# Patient Record
Sex: Female | Born: 1935 | Race: White | Hispanic: No | Smoking: Never smoker
Health system: Southern US, Community
[De-identification: ages and names within clinical notes are randomized; demographics above are authoritative.]

## PROBLEM LIST (undated history)

## (undated) DIAGNOSIS — J45909 Unspecified asthma, uncomplicated: Secondary | ICD-10-CM

## (undated) DIAGNOSIS — A048 Other specified bacterial intestinal infections: Secondary | ICD-10-CM

## (undated) DIAGNOSIS — K5790 Diverticulosis of intestine, part unspecified, without perforation or abscess without bleeding: Secondary | ICD-10-CM

## (undated) DIAGNOSIS — Z7902 Long term (current) use of antithrombotics/antiplatelets: Secondary | ICD-10-CM

## (undated) DIAGNOSIS — R7302 Impaired glucose tolerance (oral): Secondary | ICD-10-CM

## (undated) DIAGNOSIS — I4891 Unspecified atrial fibrillation: Secondary | ICD-10-CM

## (undated) DIAGNOSIS — K219 Gastro-esophageal reflux disease without esophagitis: Secondary | ICD-10-CM

## (undated) DIAGNOSIS — C50919 Malignant neoplasm of unspecified site of unspecified female breast: Secondary | ICD-10-CM

## (undated) DIAGNOSIS — H3530 Unspecified macular degeneration: Secondary | ICD-10-CM

## (undated) DIAGNOSIS — I509 Heart failure, unspecified: Secondary | ICD-10-CM

## (undated) DIAGNOSIS — Z515 Encounter for palliative care: Secondary | ICD-10-CM

## (undated) DIAGNOSIS — K5792 Diverticulitis of intestine, part unspecified, without perforation or abscess without bleeding: Secondary | ICD-10-CM

## (undated) DIAGNOSIS — Z8781 Personal history of (healed) traumatic fracture: Secondary | ICD-10-CM

## (undated) DIAGNOSIS — F32A Depression, unspecified: Secondary | ICD-10-CM

## (undated) DIAGNOSIS — I10 Essential (primary) hypertension: Secondary | ICD-10-CM

## (undated) DIAGNOSIS — R609 Edema, unspecified: Secondary | ICD-10-CM

## (undated) DIAGNOSIS — J309 Allergic rhinitis, unspecified: Secondary | ICD-10-CM

## (undated) DIAGNOSIS — Z7901 Long term (current) use of anticoagulants: Secondary | ICD-10-CM

## (undated) DIAGNOSIS — R202 Paresthesia of skin: Secondary | ICD-10-CM

## (undated) DIAGNOSIS — M810 Age-related osteoporosis without current pathological fracture: Secondary | ICD-10-CM

## (undated) DIAGNOSIS — I2510 Atherosclerotic heart disease of native coronary artery without angina pectoris: Secondary | ICD-10-CM

## (undated) DIAGNOSIS — H269 Unspecified cataract: Secondary | ICD-10-CM

## (undated) DIAGNOSIS — E559 Vitamin D deficiency, unspecified: Secondary | ICD-10-CM

## (undated) DIAGNOSIS — M4722 Other spondylosis with radiculopathy, cervical region: Secondary | ICD-10-CM

## (undated) DIAGNOSIS — M5416 Radiculopathy, lumbar region: Secondary | ICD-10-CM

## (undated) DIAGNOSIS — E7800 Pure hypercholesterolemia, unspecified: Secondary | ICD-10-CM

## (undated) DIAGNOSIS — F419 Anxiety disorder, unspecified: Secondary | ICD-10-CM

## (undated) DIAGNOSIS — I219 Acute myocardial infarction, unspecified: Secondary | ICD-10-CM

## (undated) DIAGNOSIS — R600 Localized edema: Secondary | ICD-10-CM

## (undated) DIAGNOSIS — N184 Chronic kidney disease, stage 4 (severe): Secondary | ICD-10-CM

## (undated) DIAGNOSIS — R0609 Other forms of dyspnea: Secondary | ICD-10-CM

## (undated) DIAGNOSIS — Z79891 Long term (current) use of opiate analgesic: Secondary | ICD-10-CM

## (undated) DIAGNOSIS — M545 Low back pain, unspecified: Secondary | ICD-10-CM

## (undated) DIAGNOSIS — M1990 Unspecified osteoarthritis, unspecified site: Secondary | ICD-10-CM

## (undated) DIAGNOSIS — I739 Peripheral vascular disease, unspecified: Secondary | ICD-10-CM

## (undated) DIAGNOSIS — D649 Anemia, unspecified: Secondary | ICD-10-CM

## (undated) DIAGNOSIS — E785 Hyperlipidemia, unspecified: Secondary | ICD-10-CM

## (undated) DIAGNOSIS — I447 Left bundle-branch block, unspecified: Secondary | ICD-10-CM

## (undated) DIAGNOSIS — N83209 Unspecified ovarian cyst, unspecified side: Secondary | ICD-10-CM

## (undated) DIAGNOSIS — G5600 Carpal tunnel syndrome, unspecified upper limb: Secondary | ICD-10-CM

## (undated) HISTORY — DX: Gastro-esophageal reflux disease without esophagitis: K21.9

## (undated) HISTORY — DX: Carpal tunnel syndrome, unspecified upper limb: G56.00

## (undated) HISTORY — DX: Acute myocardial infarction, unspecified: I21.9

## (undated) HISTORY — DX: Peripheral vascular disease, unspecified: I73.9

## (undated) HISTORY — DX: Diverticulosis of intestine, part unspecified, without perforation or abscess without bleeding: K57.90

## (undated) HISTORY — DX: Unspecified osteoarthritis, unspecified site: M19.90

## (undated) HISTORY — DX: Unspecified atrial fibrillation: I48.91

## (undated) HISTORY — DX: Other specified bacterial intestinal infections: A04.8

## (undated) HISTORY — DX: Age-related osteoporosis without current pathological fracture: M81.0

## (undated) HISTORY — DX: Allergic rhinitis, unspecified: J30.9

## (undated) HISTORY — DX: Heart failure, unspecified: I50.9

## (undated) HISTORY — DX: Anxiety disorder, unspecified: F41.9

## (undated) HISTORY — PX: COLONOSCOPY: SHX174

## (undated) HISTORY — DX: Chronic kidney disease, stage 4 (severe): N18.4

## (undated) HISTORY — DX: Low back pain, unspecified: M54.50

## (undated) HISTORY — DX: Unspecified ovarian cyst, unspecified side: N83.209

## (undated) HISTORY — DX: Unspecified asthma, uncomplicated: J45.909

## (undated) HISTORY — DX: Atherosclerotic heart disease of native coronary artery without angina pectoris: I25.10

## (undated) HISTORY — DX: Unspecified macular degeneration: H35.30

## (undated) HISTORY — DX: Impaired glucose tolerance (oral): R73.02

## (undated) HISTORY — DX: Depression, unspecified: F32.A

## (undated) HISTORY — DX: Malignant neoplasm of unspecified site of unspecified female breast: C50.919

## (undated) HISTORY — DX: Unspecified cataract: H26.9

## (undated) HISTORY — DX: Pure hypercholesterolemia, unspecified: E78.00

## (undated) HISTORY — DX: Vitamin D deficiency, unspecified: E55.9

## (undated) HISTORY — DX: Diverticulitis of intestine, part unspecified, without perforation or abscess without bleeding: K57.92

## (undated) HISTORY — DX: Personal history of (healed) traumatic fracture: Z87.81

---

## 1970-09-17 HISTORY — PX: ABDOMINAL HYSTERECTOMY: SHX81

## 1988-09-17 DIAGNOSIS — Z8781 Personal history of (healed) traumatic fracture: Secondary | ICD-10-CM

## 1988-09-17 HISTORY — DX: Personal history of (healed) traumatic fracture: Z87.81

## 1993-09-17 DIAGNOSIS — C50911 Malignant neoplasm of unspecified site of right female breast: Secondary | ICD-10-CM

## 1993-09-17 HISTORY — DX: Malignant neoplasm of unspecified site of right female breast: C50.911

## 1997-09-17 HISTORY — PX: MASTECTOMY: SHX3

## 2002-09-17 HISTORY — PX: SIGMOIDOSCOPY: SUR1295

## 2004-06-17 ENCOUNTER — Ambulatory Visit: Payer: Self-pay | Admitting: Oncology

## 2004-07-27 ENCOUNTER — Ambulatory Visit: Payer: Self-pay | Admitting: Oncology

## 2004-08-17 ENCOUNTER — Ambulatory Visit: Payer: Self-pay | Admitting: Oncology

## 2004-09-17 HISTORY — PX: UPPER GASTROINTESTINAL ENDOSCOPY: SHX188

## 2004-12-03 ENCOUNTER — Emergency Department: Payer: Self-pay | Admitting: Emergency Medicine

## 2004-12-04 ENCOUNTER — Ambulatory Visit: Payer: Self-pay | Admitting: Oncology

## 2004-12-16 ENCOUNTER — Ambulatory Visit: Payer: Self-pay | Admitting: Oncology

## 2004-12-18 ENCOUNTER — Ambulatory Visit: Payer: Self-pay

## 2005-01-31 ENCOUNTER — Ambulatory Visit: Payer: Self-pay | Admitting: Oncology

## 2005-06-04 ENCOUNTER — Ambulatory Visit: Payer: Self-pay | Admitting: Oncology

## 2005-06-17 ENCOUNTER — Ambulatory Visit: Payer: Self-pay | Admitting: Oncology

## 2005-06-17 DIAGNOSIS — A048 Other specified bacterial intestinal infections: Secondary | ICD-10-CM

## 2005-06-17 HISTORY — DX: Other specified bacterial intestinal infections: A04.8

## 2005-06-29 ENCOUNTER — Ambulatory Visit: Payer: Self-pay | Admitting: Gastroenterology

## 2005-07-05 ENCOUNTER — Ambulatory Visit: Payer: Self-pay | Admitting: Gastroenterology

## 2005-07-18 ENCOUNTER — Ambulatory Visit: Payer: Self-pay | Admitting: Oncology

## 2005-09-17 HISTORY — PX: CATARACT EXTRACTION: SUR2

## 2005-10-19 ENCOUNTER — Ambulatory Visit: Payer: Self-pay | Admitting: Oncology

## 2005-11-15 ENCOUNTER — Ambulatory Visit: Payer: Self-pay | Admitting: Oncology

## 2005-12-16 DIAGNOSIS — Z9842 Cataract extraction status, left eye: Secondary | ICD-10-CM

## 2005-12-16 HISTORY — DX: Cataract extraction status, right eye: Z98.42

## 2005-12-19 ENCOUNTER — Ambulatory Visit: Payer: Self-pay | Admitting: Ophthalmology

## 2006-02-04 ENCOUNTER — Ambulatory Visit: Payer: Self-pay | Admitting: Oncology

## 2006-02-28 IMAGING — NM NUCLEAR MEDICINE GASTRIC EMPTYING STUDY
1 series · 6 of 6 positions shown · non-contrast
Comparison: none

REASON FOR EXAM: LLQ and RLQ abdominal pain, nausea
COMMENTS:

[Series 0: gastric empty · 1.7mm · 1.65mm/px · 6 of 20 frames shown]
[frame 2/20]
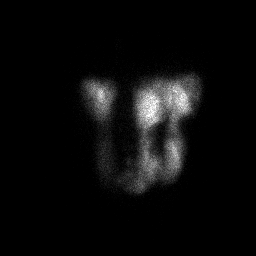
[frame 5/20]
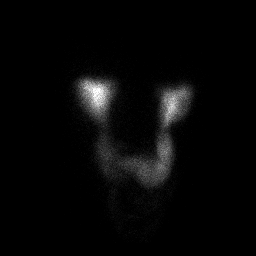
[frame 9/20]
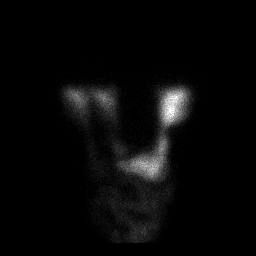
[frame 12/20]
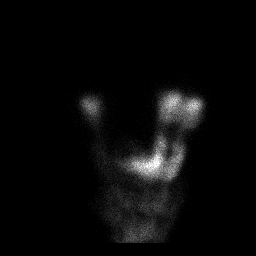
[frame 15/20]
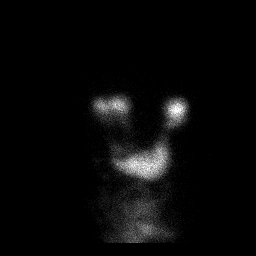
[frame 19/20]
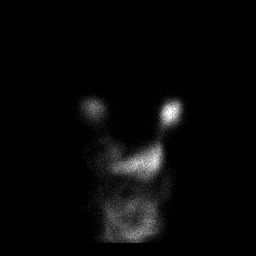

[6 of 6 positions shown; findings below may reference images not displayed]

PROCEDURE:     NM  - NM GASTRIC EMPTYING STUDY  - June 29, 2005  [DATE]

RESULT:     Following oral administration of 2.1 mCi of Technetium 99m
Sulfur Colloid administered in an egg sandwich with 8 ounces of water, there
is observed 53.7% clearance of the administered tracer at 95 minutes. This
value is in the normal range.
IMPRESSION: Normal study.

## 2006-05-07 ENCOUNTER — Ambulatory Visit: Payer: Self-pay | Admitting: Ophthalmology

## 2006-10-18 ENCOUNTER — Ambulatory Visit: Payer: Self-pay | Admitting: Internal Medicine

## 2006-11-16 ENCOUNTER — Ambulatory Visit: Payer: Self-pay | Admitting: Internal Medicine

## 2006-12-04 ENCOUNTER — Ambulatory Visit: Payer: Self-pay | Admitting: Specialist

## 2006-12-25 ENCOUNTER — Ambulatory Visit: Payer: Self-pay | Admitting: Pain Medicine

## 2007-01-09 ENCOUNTER — Ambulatory Visit: Payer: Self-pay | Admitting: Pain Medicine

## 2007-02-04 ENCOUNTER — Ambulatory Visit: Payer: Self-pay | Admitting: Physician Assistant

## 2007-02-14 ENCOUNTER — Ambulatory Visit: Payer: Self-pay | Admitting: Internal Medicine

## 2007-06-23 ENCOUNTER — Inpatient Hospital Stay: Payer: Self-pay | Admitting: Internal Medicine

## 2007-10-19 ENCOUNTER — Ambulatory Visit: Payer: Self-pay | Admitting: Internal Medicine

## 2007-11-16 ENCOUNTER — Ambulatory Visit: Payer: Self-pay | Admitting: Internal Medicine

## 2008-02-23 ENCOUNTER — Ambulatory Visit: Payer: Self-pay | Admitting: Internal Medicine

## 2008-05-18 ENCOUNTER — Ambulatory Visit: Payer: Self-pay | Admitting: Internal Medicine

## 2008-07-06 ENCOUNTER — Ambulatory Visit: Payer: Self-pay | Admitting: Family Medicine

## 2008-07-13 ENCOUNTER — Ambulatory Visit: Payer: Self-pay | Admitting: Internal Medicine

## 2008-10-18 ENCOUNTER — Ambulatory Visit: Payer: Self-pay | Admitting: Internal Medicine

## 2008-11-11 ENCOUNTER — Ambulatory Visit: Payer: Self-pay | Admitting: Internal Medicine

## 2008-11-15 ENCOUNTER — Ambulatory Visit: Payer: Self-pay | Admitting: Internal Medicine

## 2009-02-24 ENCOUNTER — Ambulatory Visit: Payer: Self-pay | Admitting: Oncology

## 2009-08-18 ENCOUNTER — Observation Stay: Payer: Self-pay | Admitting: Internal Medicine

## 2009-09-14 ENCOUNTER — Ambulatory Visit: Payer: Self-pay | Admitting: Internal Medicine

## 2009-09-14 ENCOUNTER — Emergency Department: Payer: Self-pay | Admitting: Unknown Physician Specialty

## 2009-09-21 ENCOUNTER — Ambulatory Visit: Payer: Self-pay

## 2009-10-18 ENCOUNTER — Ambulatory Visit: Payer: Self-pay | Admitting: Internal Medicine

## 2009-10-19 ENCOUNTER — Ambulatory Visit: Payer: Self-pay | Admitting: Pain Medicine

## 2009-11-01 ENCOUNTER — Ambulatory Visit: Payer: Self-pay | Admitting: Pain Medicine

## 2009-11-10 ENCOUNTER — Ambulatory Visit: Payer: Self-pay | Admitting: Internal Medicine

## 2009-11-15 ENCOUNTER — Ambulatory Visit: Payer: Self-pay | Admitting: Internal Medicine

## 2009-11-28 ENCOUNTER — Ambulatory Visit: Payer: Self-pay | Admitting: Pain Medicine

## 2009-12-13 ENCOUNTER — Ambulatory Visit: Payer: Self-pay | Admitting: Pain Medicine

## 2009-12-29 ENCOUNTER — Ambulatory Visit: Payer: Self-pay | Admitting: Pain Medicine

## 2010-03-24 ENCOUNTER — Ambulatory Visit: Payer: Self-pay | Admitting: Family Medicine

## 2010-05-16 IMAGING — US US EXTREM LOW VENOUS*L*
1 series · 17 of 24 positions shown · non-contrast
Comparison: none

REASON FOR EXAM: left groin pain
COMMENTS:

[Series 1: us extrem low venous*left* · 17 of 28 slices shown]
[im 1/28]
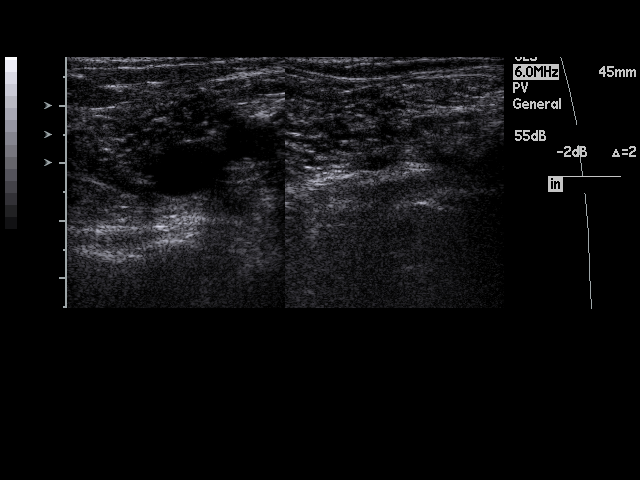
[im 3/28]
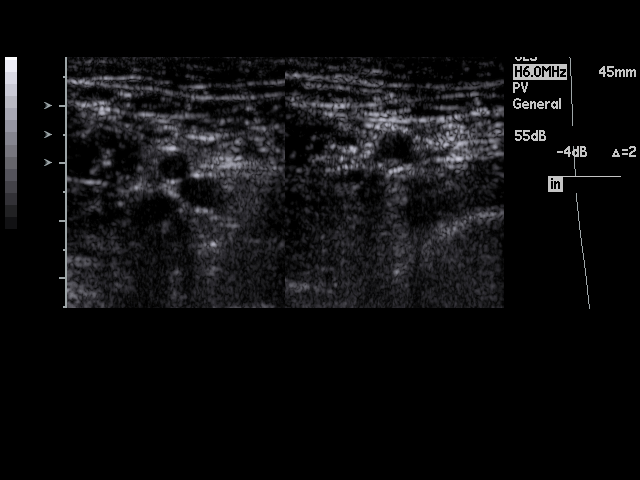
[im 4/28]
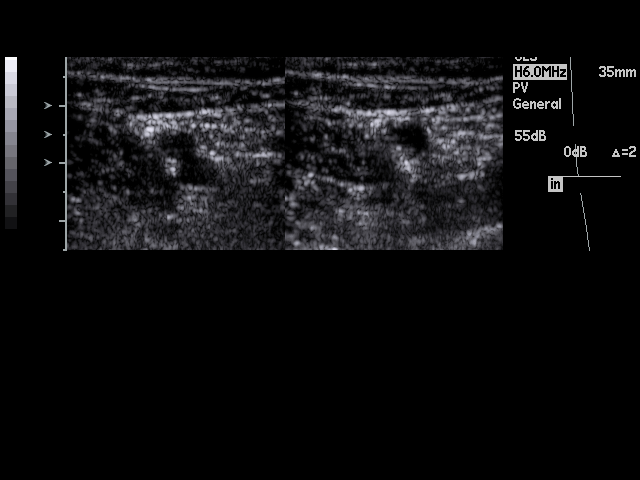
[im 5/28]
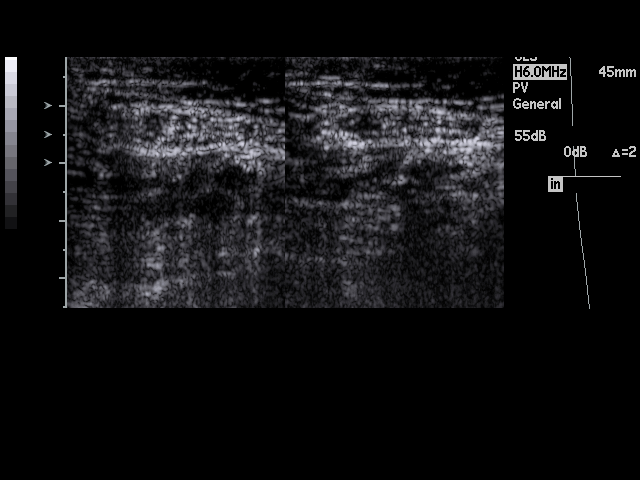
[im 8/28]
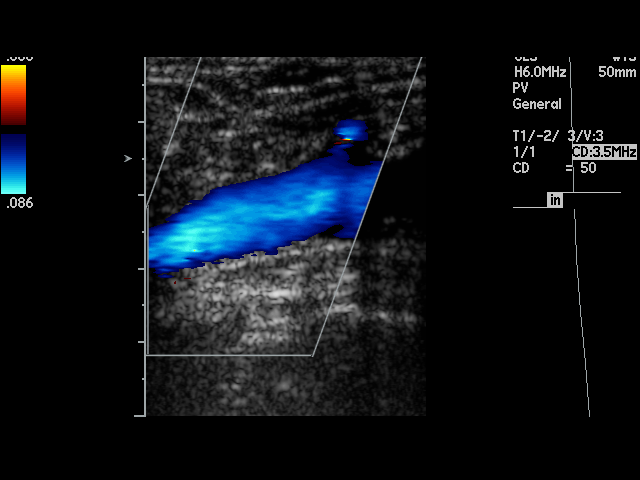
[im 9/28]
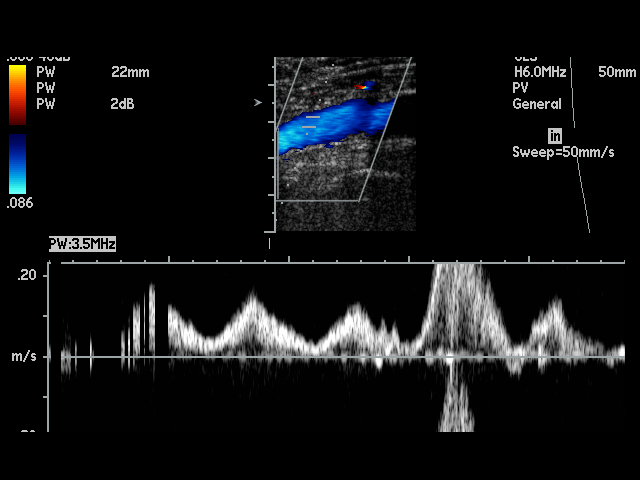
[im 11/28]
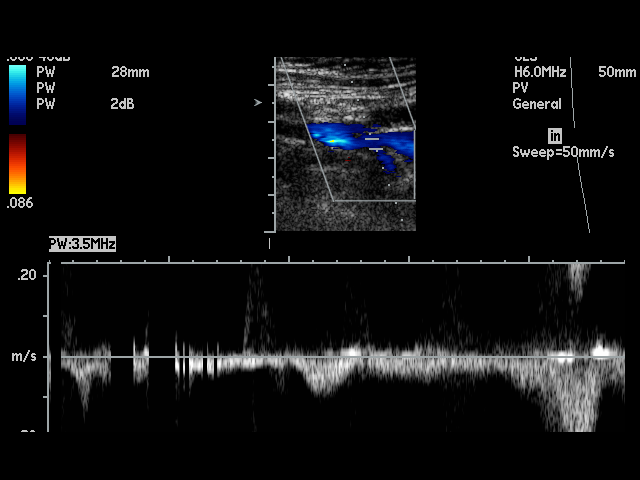
[im 12/28]
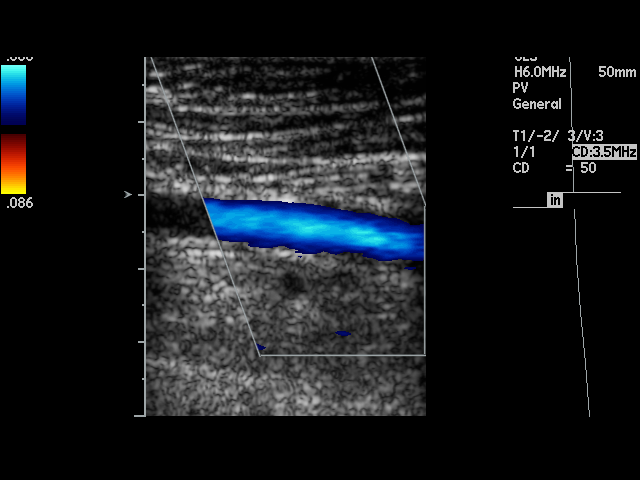
[im 15/28]
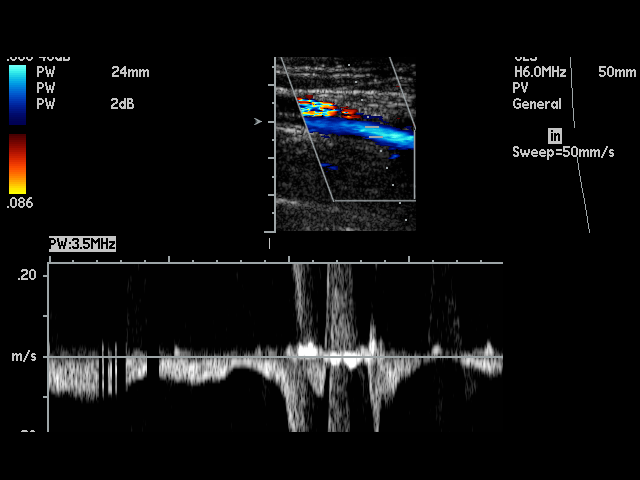
[im 16/28]
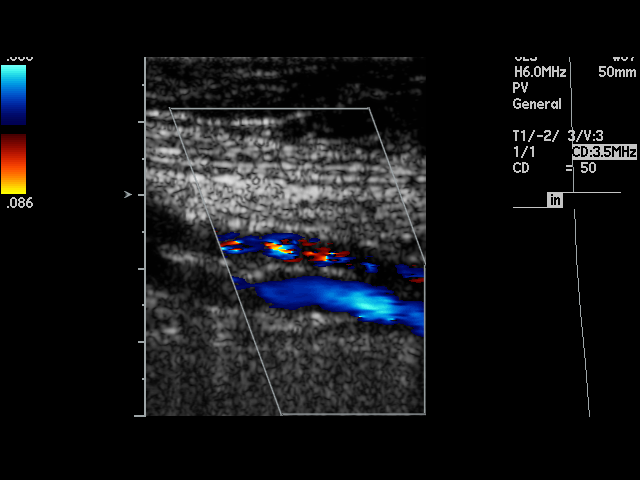
[im 17/28]
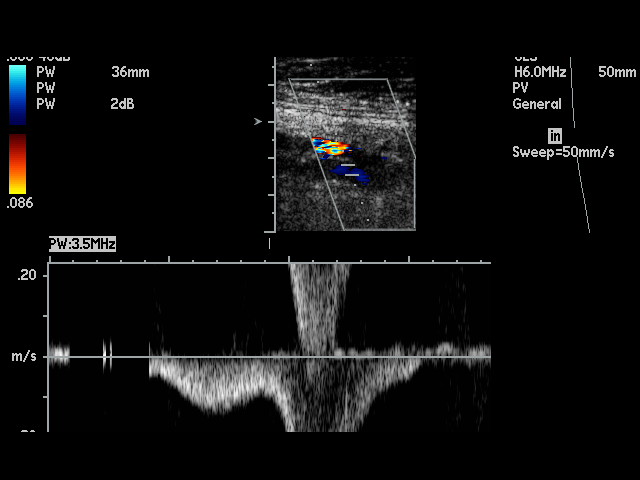
[im 19/28]
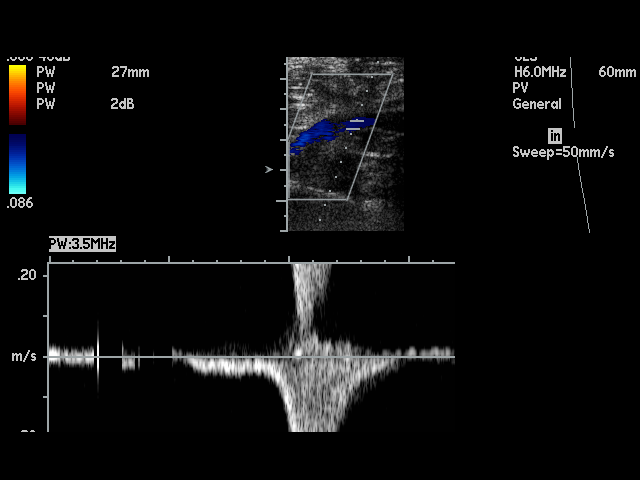
[im 20/28]
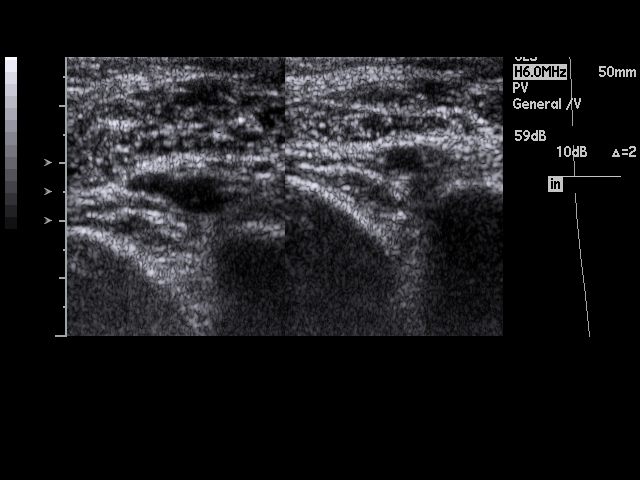
[im 23/28]
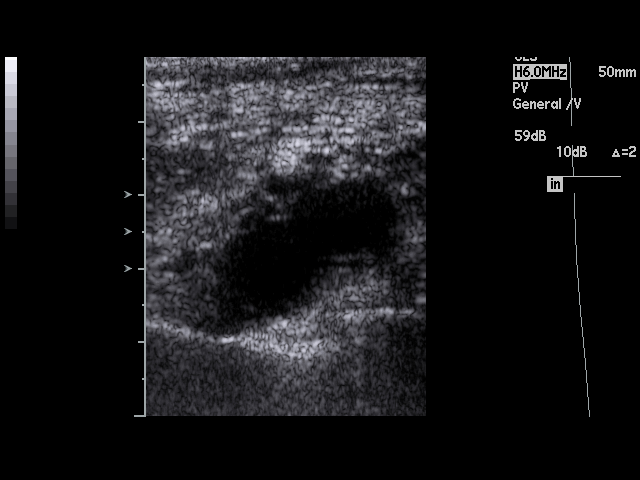
[im 24/28]
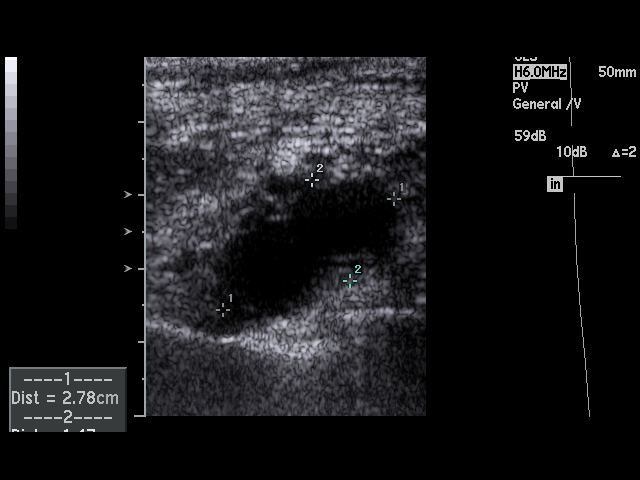
[im 25/28]
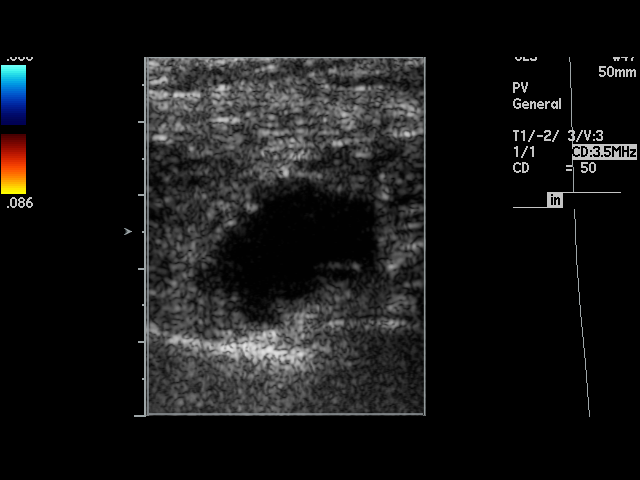
[im 28/28]
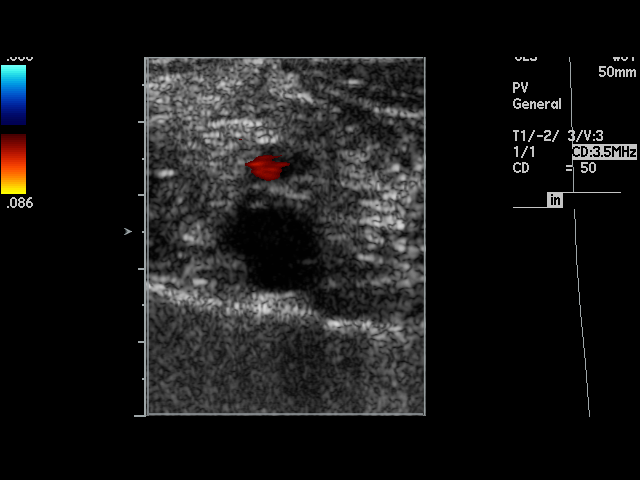

[17 of 24 positions shown; findings below may reference images not displayed]

PROCEDURE:     US  - US DOPPLER LOW EXTR LEFT  - September 14, 2009  [DATE]

RESULT:     The left femoral and popliteal veins are normally compressible.
The waveform patterns are normal and the color flow images are normal. The
response to the Valsalva maneuver is normal. There is a popliteal cyst
measuring 2.8 cm in greatest dimension.
IMPRESSION: There is no evidence of deep venous thrombosis in the left
lower extremity. There is a Baker's cyst present.

## 2010-10-19 ENCOUNTER — Ambulatory Visit: Payer: Self-pay | Admitting: Family Medicine

## 2010-11-24 ENCOUNTER — Ambulatory Visit: Payer: Self-pay | Admitting: Internal Medicine

## 2010-11-24 ENCOUNTER — Emergency Department: Payer: Self-pay | Admitting: Emergency Medicine

## 2011-04-17 ENCOUNTER — Ambulatory Visit: Payer: Self-pay | Admitting: Family Medicine

## 2012-05-16 ENCOUNTER — Ambulatory Visit: Payer: Self-pay | Admitting: Family Medicine

## 2012-07-11 ENCOUNTER — Ambulatory Visit: Payer: Self-pay

## 2013-06-10 ENCOUNTER — Ambulatory Visit: Payer: Self-pay | Admitting: Family Medicine

## 2013-11-12 ENCOUNTER — Ambulatory Visit: Payer: Self-pay | Admitting: Emergency Medicine

## 2014-01-20 ENCOUNTER — Ambulatory Visit: Payer: Self-pay | Admitting: Gastroenterology

## 2015-05-09 ENCOUNTER — Other Ambulatory Visit: Payer: Self-pay | Admitting: Family Medicine

## 2015-05-09 DIAGNOSIS — M79604 Pain in right leg: Secondary | ICD-10-CM

## 2015-05-09 DIAGNOSIS — M545 Low back pain, unspecified: Secondary | ICD-10-CM

## 2015-05-09 DIAGNOSIS — R29898 Other symptoms and signs involving the musculoskeletal system: Secondary | ICD-10-CM

## 2015-05-09 DIAGNOSIS — R202 Paresthesia of skin: Secondary | ICD-10-CM

## 2015-05-12 ENCOUNTER — Ambulatory Visit
Admission: RE | Admit: 2015-05-12 | Discharge: 2015-05-12 | Disposition: A | Discharge status: Home / Self Care | Payer: Medicare Other | Source: Ambulatory Visit | Attending: Family Medicine | Admitting: Family Medicine

## 2015-05-12 DIAGNOSIS — M47896 Other spondylosis, lumbar region: Secondary | ICD-10-CM | POA: Diagnosis not present

## 2015-05-12 DIAGNOSIS — R202 Paresthesia of skin: Secondary | ICD-10-CM | POA: Diagnosis present

## 2015-05-12 DIAGNOSIS — M2410 Other articular cartilage disorders, unspecified site: Secondary | ICD-10-CM | POA: Insufficient documentation

## 2015-05-12 DIAGNOSIS — M4854XD Collapsed vertebra, not elsewhere classified, thoracic region, subsequent encounter for fracture with routine healing: Secondary | ICD-10-CM | POA: Insufficient documentation

## 2015-05-12 DIAGNOSIS — M79604 Pain in right leg: Secondary | ICD-10-CM

## 2015-05-12 DIAGNOSIS — M5124 Other intervertebral disc displacement, thoracic region: Secondary | ICD-10-CM | POA: Insufficient documentation

## 2015-05-12 DIAGNOSIS — R29898 Other symptoms and signs involving the musculoskeletal system: Secondary | ICD-10-CM

## 2015-05-12 DIAGNOSIS — M545 Low back pain: Secondary | ICD-10-CM | POA: Diagnosis present

## 2015-05-12 DIAGNOSIS — X58XXXD Exposure to other specified factors, subsequent encounter: Secondary | ICD-10-CM | POA: Diagnosis not present

## 2015-09-23 DIAGNOSIS — E7800 Pure hypercholesterolemia, unspecified: Secondary | ICD-10-CM | POA: Diagnosis not present

## 2015-09-23 DIAGNOSIS — K210 Gastro-esophageal reflux disease with esophagitis: Secondary | ICD-10-CM | POA: Diagnosis not present

## 2015-09-23 DIAGNOSIS — F419 Anxiety disorder, unspecified: Secondary | ICD-10-CM | POA: Diagnosis not present

## 2015-10-06 DIAGNOSIS — R0982 Postnasal drip: Secondary | ICD-10-CM | POA: Diagnosis not present

## 2015-11-25 DIAGNOSIS — H353212 Exudative age-related macular degeneration, right eye, with inactive choroidal neovascularization: Secondary | ICD-10-CM | POA: Diagnosis not present

## 2015-11-25 DIAGNOSIS — H353122 Nonexudative age-related macular degeneration, left eye, intermediate dry stage: Secondary | ICD-10-CM | POA: Diagnosis not present

## 2015-12-01 DIAGNOSIS — M4806 Spinal stenosis, lumbar region: Secondary | ICD-10-CM | POA: Diagnosis not present

## 2015-12-01 DIAGNOSIS — M5136 Other intervertebral disc degeneration, lumbar region: Secondary | ICD-10-CM | POA: Diagnosis not present

## 2015-12-01 DIAGNOSIS — M5416 Radiculopathy, lumbar region: Secondary | ICD-10-CM | POA: Diagnosis not present

## 2016-01-05 DIAGNOSIS — M5136 Other intervertebral disc degeneration, lumbar region: Secondary | ICD-10-CM | POA: Diagnosis not present

## 2016-01-05 DIAGNOSIS — M4806 Spinal stenosis, lumbar region: Secondary | ICD-10-CM | POA: Diagnosis not present

## 2016-01-05 DIAGNOSIS — M5416 Radiculopathy, lumbar region: Secondary | ICD-10-CM | POA: Diagnosis not present

## 2016-01-26 DIAGNOSIS — M47817 Spondylosis without myelopathy or radiculopathy, lumbosacral region: Secondary | ICD-10-CM | POA: Diagnosis not present

## 2016-02-08 DIAGNOSIS — R52 Pain, unspecified: Secondary | ICD-10-CM | POA: Diagnosis not present

## 2016-02-08 DIAGNOSIS — M47817 Spondylosis without myelopathy or radiculopathy, lumbosacral region: Secondary | ICD-10-CM | POA: Diagnosis not present

## 2016-02-24 DIAGNOSIS — H353212 Exudative age-related macular degeneration, right eye, with inactive choroidal neovascularization: Secondary | ICD-10-CM | POA: Diagnosis not present

## 2016-03-12 DIAGNOSIS — M47817 Spondylosis without myelopathy or radiculopathy, lumbosacral region: Secondary | ICD-10-CM | POA: Diagnosis not present

## 2016-03-28 DIAGNOSIS — M47817 Spondylosis without myelopathy or radiculopathy, lumbosacral region: Secondary | ICD-10-CM | POA: Diagnosis not present

## 2016-04-10 DIAGNOSIS — K210 Gastro-esophageal reflux disease with esophagitis: Secondary | ICD-10-CM | POA: Diagnosis not present

## 2016-04-10 DIAGNOSIS — R600 Localized edema: Secondary | ICD-10-CM | POA: Diagnosis not present

## 2016-04-10 DIAGNOSIS — F419 Anxiety disorder, unspecified: Secondary | ICD-10-CM | POA: Diagnosis not present

## 2016-06-29 DIAGNOSIS — H353212 Exudative age-related macular degeneration, right eye, with inactive choroidal neovascularization: Secondary | ICD-10-CM | POA: Diagnosis not present

## 2016-10-12 DIAGNOSIS — K210 Gastro-esophageal reflux disease with esophagitis: Secondary | ICD-10-CM | POA: Diagnosis not present

## 2016-10-12 DIAGNOSIS — E7800 Pure hypercholesterolemia, unspecified: Secondary | ICD-10-CM | POA: Diagnosis not present

## 2016-10-12 DIAGNOSIS — L989 Disorder of the skin and subcutaneous tissue, unspecified: Secondary | ICD-10-CM | POA: Diagnosis not present

## 2016-10-12 DIAGNOSIS — F419 Anxiety disorder, unspecified: Secondary | ICD-10-CM | POA: Diagnosis not present

## 2016-10-26 DIAGNOSIS — H353211 Exudative age-related macular degeneration, right eye, with active choroidal neovascularization: Secondary | ICD-10-CM | POA: Diagnosis not present

## 2016-11-14 DIAGNOSIS — L821 Other seborrheic keratosis: Secondary | ICD-10-CM | POA: Diagnosis not present

## 2016-11-14 DIAGNOSIS — C44622 Squamous cell carcinoma of skin of right upper limb, including shoulder: Secondary | ICD-10-CM | POA: Diagnosis not present

## 2016-11-14 DIAGNOSIS — D485 Neoplasm of uncertain behavior of skin: Secondary | ICD-10-CM | POA: Diagnosis not present

## 2016-11-14 DIAGNOSIS — L570 Actinic keratosis: Secondary | ICD-10-CM | POA: Diagnosis not present

## 2016-12-25 DIAGNOSIS — L570 Actinic keratosis: Secondary | ICD-10-CM | POA: Diagnosis not present

## 2016-12-25 DIAGNOSIS — C44622 Squamous cell carcinoma of skin of right upper limb, including shoulder: Secondary | ICD-10-CM | POA: Diagnosis not present

## 2017-02-28 DIAGNOSIS — H353212 Exudative age-related macular degeneration, right eye, with inactive choroidal neovascularization: Secondary | ICD-10-CM | POA: Diagnosis not present

## 2017-02-28 DIAGNOSIS — M3501 Sicca syndrome with keratoconjunctivitis: Secondary | ICD-10-CM | POA: Diagnosis not present

## 2017-03-29 DIAGNOSIS — H353211 Exudative age-related macular degeneration, right eye, with active choroidal neovascularization: Secondary | ICD-10-CM | POA: Diagnosis not present

## 2017-04-23 DIAGNOSIS — M47816 Spondylosis without myelopathy or radiculopathy, lumbar region: Secondary | ICD-10-CM | POA: Diagnosis not present

## 2017-04-23 DIAGNOSIS — M5136 Other intervertebral disc degeneration, lumbar region: Secondary | ICD-10-CM | POA: Diagnosis not present

## 2017-04-25 DIAGNOSIS — K210 Gastro-esophageal reflux disease with esophagitis: Secondary | ICD-10-CM | POA: Diagnosis not present

## 2017-04-25 DIAGNOSIS — E7800 Pure hypercholesterolemia, unspecified: Secondary | ICD-10-CM | POA: Diagnosis not present

## 2017-04-25 DIAGNOSIS — R200 Anesthesia of skin: Secondary | ICD-10-CM | POA: Diagnosis not present

## 2017-04-25 DIAGNOSIS — R5383 Other fatigue: Secondary | ICD-10-CM | POA: Diagnosis not present

## 2017-04-25 DIAGNOSIS — Z79899 Other long term (current) drug therapy: Secondary | ICD-10-CM | POA: Diagnosis not present

## 2017-04-25 DIAGNOSIS — F5105 Insomnia due to other mental disorder: Secondary | ICD-10-CM | POA: Diagnosis not present

## 2017-04-25 DIAGNOSIS — F419 Anxiety disorder, unspecified: Secondary | ICD-10-CM | POA: Diagnosis not present

## 2017-04-25 DIAGNOSIS — Z833 Family history of diabetes mellitus: Secondary | ICD-10-CM | POA: Diagnosis not present

## 2017-04-25 DIAGNOSIS — N289 Disorder of kidney and ureter, unspecified: Secondary | ICD-10-CM | POA: Diagnosis not present

## 2017-05-06 DIAGNOSIS — M47816 Spondylosis without myelopathy or radiculopathy, lumbar region: Secondary | ICD-10-CM | POA: Diagnosis not present

## 2017-05-06 DIAGNOSIS — M47817 Spondylosis without myelopathy or radiculopathy, lumbosacral region: Secondary | ICD-10-CM | POA: Diagnosis not present

## 2017-05-30 DIAGNOSIS — N289 Disorder of kidney and ureter, unspecified: Secondary | ICD-10-CM | POA: Diagnosis not present

## 2017-07-18 DIAGNOSIS — M47816 Spondylosis without myelopathy or radiculopathy, lumbar region: Secondary | ICD-10-CM | POA: Diagnosis not present

## 2017-07-18 DIAGNOSIS — M533 Sacrococcygeal disorders, not elsewhere classified: Secondary | ICD-10-CM | POA: Diagnosis not present

## 2017-08-12 DIAGNOSIS — M533 Sacrococcygeal disorders, not elsewhere classified: Secondary | ICD-10-CM | POA: Diagnosis not present

## 2017-08-12 DIAGNOSIS — M47898 Other spondylosis, sacral and sacrococcygeal region: Secondary | ICD-10-CM | POA: Diagnosis not present

## 2017-10-01 DIAGNOSIS — H353212 Exudative age-related macular degeneration, right eye, with inactive choroidal neovascularization: Secondary | ICD-10-CM | POA: Diagnosis not present

## 2017-10-01 DIAGNOSIS — H43813 Vitreous degeneration, bilateral: Secondary | ICD-10-CM | POA: Diagnosis not present

## 2017-10-24 DIAGNOSIS — M5134 Other intervertebral disc degeneration, thoracic region: Secondary | ICD-10-CM | POA: Diagnosis not present

## 2017-10-24 DIAGNOSIS — M549 Dorsalgia, unspecified: Secondary | ICD-10-CM | POA: Diagnosis not present

## 2017-10-24 DIAGNOSIS — Z853 Personal history of malignant neoplasm of breast: Secondary | ICD-10-CM | POA: Diagnosis not present

## 2017-10-24 DIAGNOSIS — Z9011 Acquired absence of right breast and nipple: Secondary | ICD-10-CM | POA: Diagnosis not present

## 2017-10-24 DIAGNOSIS — M4807 Spinal stenosis, lumbosacral region: Secondary | ICD-10-CM | POA: Diagnosis not present

## 2017-10-24 DIAGNOSIS — G8929 Other chronic pain: Secondary | ICD-10-CM | POA: Diagnosis not present

## 2017-10-24 DIAGNOSIS — M546 Pain in thoracic spine: Secondary | ICD-10-CM | POA: Diagnosis not present

## 2017-10-24 DIAGNOSIS — Z79899 Other long term (current) drug therapy: Secondary | ICD-10-CM | POA: Diagnosis not present

## 2017-10-24 DIAGNOSIS — M5136 Other intervertebral disc degeneration, lumbar region: Secondary | ICD-10-CM | POA: Diagnosis not present

## 2017-10-24 DIAGNOSIS — M5135 Other intervertebral disc degeneration, thoracolumbar region: Secondary | ICD-10-CM | POA: Diagnosis not present

## 2017-10-24 DIAGNOSIS — Z7982 Long term (current) use of aspirin: Secondary | ICD-10-CM | POA: Diagnosis not present

## 2017-10-29 DIAGNOSIS — Z0000 Encounter for general adult medical examination without abnormal findings: Secondary | ICD-10-CM | POA: Diagnosis not present

## 2017-10-29 DIAGNOSIS — R030 Elevated blood-pressure reading, without diagnosis of hypertension: Secondary | ICD-10-CM | POA: Diagnosis not present

## 2017-10-29 DIAGNOSIS — R7302 Impaired glucose tolerance (oral): Secondary | ICD-10-CM | POA: Diagnosis not present

## 2017-10-29 DIAGNOSIS — K210 Gastro-esophageal reflux disease with esophagitis: Secondary | ICD-10-CM | POA: Diagnosis not present

## 2017-10-29 DIAGNOSIS — F419 Anxiety disorder, unspecified: Secondary | ICD-10-CM | POA: Diagnosis not present

## 2017-10-29 DIAGNOSIS — Z7722 Contact with and (suspected) exposure to environmental tobacco smoke (acute) (chronic): Secondary | ICD-10-CM | POA: Diagnosis not present

## 2017-10-29 DIAGNOSIS — Z79899 Other long term (current) drug therapy: Secondary | ICD-10-CM | POA: Diagnosis not present

## 2017-10-29 DIAGNOSIS — Z23 Encounter for immunization: Secondary | ICD-10-CM | POA: Diagnosis not present

## 2017-10-29 DIAGNOSIS — E7800 Pure hypercholesterolemia, unspecified: Secondary | ICD-10-CM | POA: Diagnosis not present

## 2017-11-11 DIAGNOSIS — M47814 Spondylosis without myelopathy or radiculopathy, thoracic region: Secondary | ICD-10-CM | POA: Diagnosis not present

## 2019-08-10 HISTORY — PX: CARPAL TUNNEL RELEASE: SHX101

## 2019-11-16 DIAGNOSIS — R200 Anesthesia of skin: Secondary | ICD-10-CM | POA: Diagnosis not present

## 2019-11-16 DIAGNOSIS — G5601 Carpal tunnel syndrome, right upper limb: Secondary | ICD-10-CM | POA: Diagnosis not present

## 2019-11-16 DIAGNOSIS — M79641 Pain in right hand: Secondary | ICD-10-CM | POA: Diagnosis not present

## 2019-11-16 DIAGNOSIS — R202 Paresthesia of skin: Secondary | ICD-10-CM | POA: Diagnosis not present

## 2019-12-29 DIAGNOSIS — Z79899 Other long term (current) drug therapy: Secondary | ICD-10-CM | POA: Diagnosis not present

## 2019-12-29 DIAGNOSIS — M47817 Spondylosis without myelopathy or radiculopathy, lumbosacral region: Secondary | ICD-10-CM | POA: Diagnosis not present

## 2019-12-29 DIAGNOSIS — R600 Localized edema: Secondary | ICD-10-CM | POA: Diagnosis not present

## 2019-12-29 DIAGNOSIS — I499 Cardiac arrhythmia, unspecified: Secondary | ICD-10-CM | POA: Diagnosis not present

## 2020-01-12 DIAGNOSIS — M542 Cervicalgia: Secondary | ICD-10-CM | POA: Diagnosis not present

## 2020-01-12 DIAGNOSIS — M47817 Spondylosis without myelopathy or radiculopathy, lumbosacral region: Secondary | ICD-10-CM | POA: Diagnosis not present

## 2020-01-12 DIAGNOSIS — R600 Localized edema: Secondary | ICD-10-CM | POA: Diagnosis not present

## 2020-01-12 DIAGNOSIS — M791 Myalgia, unspecified site: Secondary | ICD-10-CM | POA: Diagnosis not present

## 2020-01-18 DIAGNOSIS — H353212 Exudative age-related macular degeneration, right eye, with inactive choroidal neovascularization: Secondary | ICD-10-CM | POA: Diagnosis not present

## 2020-02-04 DIAGNOSIS — L239 Allergic contact dermatitis, unspecified cause: Secondary | ICD-10-CM | POA: Diagnosis not present

## 2020-02-04 DIAGNOSIS — G5691 Unspecified mononeuropathy of right upper limb: Secondary | ICD-10-CM | POA: Diagnosis not present

## 2020-02-04 DIAGNOSIS — R600 Localized edema: Secondary | ICD-10-CM | POA: Diagnosis not present

## 2020-03-08 DIAGNOSIS — L600 Ingrowing nail: Secondary | ICD-10-CM | POA: Diagnosis not present

## 2020-03-08 DIAGNOSIS — M79674 Pain in right toe(s): Secondary | ICD-10-CM | POA: Diagnosis not present

## 2020-03-08 DIAGNOSIS — L03031 Cellulitis of right toe: Secondary | ICD-10-CM | POA: Diagnosis not present

## 2020-04-05 DIAGNOSIS — L600 Ingrowing nail: Secondary | ICD-10-CM | POA: Diagnosis not present

## 2020-04-05 DIAGNOSIS — M79674 Pain in right toe(s): Secondary | ICD-10-CM | POA: Diagnosis not present

## 2020-04-05 DIAGNOSIS — I739 Peripheral vascular disease, unspecified: Secondary | ICD-10-CM | POA: Diagnosis not present

## 2020-04-13 DIAGNOSIS — R600 Localized edema: Secondary | ICD-10-CM | POA: Diagnosis not present

## 2020-04-13 DIAGNOSIS — M47816 Spondylosis without myelopathy or radiculopathy, lumbar region: Secondary | ICD-10-CM | POA: Diagnosis not present

## 2020-04-13 DIAGNOSIS — Z1329 Encounter for screening for other suspected endocrine disorder: Secondary | ICD-10-CM | POA: Diagnosis not present

## 2020-04-13 DIAGNOSIS — R030 Elevated blood-pressure reading, without diagnosis of hypertension: Secondary | ICD-10-CM | POA: Diagnosis not present

## 2020-04-13 DIAGNOSIS — M4722 Other spondylosis with radiculopathy, cervical region: Secondary | ICD-10-CM | POA: Diagnosis not present

## 2020-04-13 DIAGNOSIS — K2100 Gastro-esophageal reflux disease with esophagitis, without bleeding: Secondary | ICD-10-CM | POA: Diagnosis not present

## 2020-04-13 DIAGNOSIS — E7800 Pure hypercholesterolemia, unspecified: Secondary | ICD-10-CM | POA: Diagnosis not present

## 2020-04-13 DIAGNOSIS — R7302 Impaired glucose tolerance (oral): Secondary | ICD-10-CM | POA: Diagnosis not present

## 2020-05-03 DIAGNOSIS — L600 Ingrowing nail: Secondary | ICD-10-CM | POA: Diagnosis not present

## 2020-05-17 ENCOUNTER — Telehealth: Payer: Self-pay | Admitting: Family Medicine

## 2020-05-17 NOTE — Telephone Encounter (Signed)
°   Cheyenne [name] DOB: Aug 01, 1928 MRN: [id number]   RIDER WAIVER AND RELEASE OF LIABILITY  For purposes of improving physical access to our facilities, [facility] is pleased to partner with third parties to provide [place] [facility] patients or other authorized individuals the option of convenient, on-demand ground transportation services (the [employer]) through use of the technology service that enables users to request on-demand ground transportation from independent third-party providers.  By opting to use and accept these [employer], I, the undersigned, hereby agree on behalf of myself, and on behalf of any minor child using the [employer] for whom I am the parent or legal guardian, as follows:  1. Government social research officer provided to me are provided by independent third-party transportation providers who are not [employer] or employees and who are unaffiliated with [employer]. 2. [facility] is neither a transportation carrier nor a common or public carrier. 3. [facility] has no control over the quality or safety of the transportation that occurs as a result of the [employer]. 4. [facility] cannot guarantee that any third-party transportation provider will complete any arranged transportation service. 5. [facility] makes no representation, warranty, or guarantee regarding the reliability, timeliness, quality, safety, suitability, or availability of any of the Transport Services or that they will be error free. 6. I fully understand that traveling by vehicle involves risks and dangers of serious bodily injury, including permanent disability, paralysis, and death. I agree, on behalf of myself and on behalf of any minor child using the Transport Services for whom I am the parent or legal guardian, that the entire risk arising out of my use of the [employer] remains solely with me, to the maximum extent permitted under applicable law. 7. The [employer] are provided as is and as available. [facility] disclaims all representations and warranties, express, implied or statutory, not expressly set out in these terms, including the implied warranties of merchantability and fitness for a particular purpose. 8. I hereby waive and release [facility], its agents, employees, officers, directors, representatives, insurers, attorneys, assigns, successors, subsidiaries, and affiliates from any and all past, present, or future claims, demands, liabilities, actions, causes of action, or suits of any kind directly or indirectly arising from acceptance and use of the [employer]. 9. I further waive and release [facility] and its affiliates from all present and future liability and responsibility for any injury or death to persons or damages to property caused by or related to the use of the [employer]. 10. I have read this Waiver and Release of Liability, and I understand the terms used in it and their legal significance. This Waiver is freely and voluntarily given with the understanding that my right (as well as the right of any minor child for whom I am the parent or legal guardian using the [employer]) to legal recourse against [place] in connection with the [employer] is knowingly surrendered in return for use of these services.   I attest that I read the consent document to Cheyenne [name], gave Cheyenne [name] the opportunity to ask questions and answered the questions asked (if any). I affirm that Cheyenne [name] then provided consent for she's participation in this program.     Cheyenne [name]

## 2020-07-19 ENCOUNTER — Telehealth: Payer: Self-pay | Admitting: Family Medicine

## 2020-07-19 NOTE — Telephone Encounter (Signed)
°   DHIYA SMITS DOB: 04/22/1928 MRN: [id number]   RIDER WAIVER AND RELEASE OF LIABILITY  For purposes of improving physical access to our facilities, [facility] is pleased to partner with third parties to provide [facility] patients or other authorized individuals the option of convenient, on-demand ground transportation services (the [employer]) through use of the technology service that enables users to request on-demand ground transportation from independent third-party providers.  By opting to use and accept these [employer], I, the undersigned, hereby agree on behalf of myself, and on behalf of any minor child using the [employer] for whom I am the parent or legal guardian, as follows:  1. Government social research officer provided to me are provided by independent third-party transportation providers who are not [employer] or employees and who are unaffiliated with [employer]. 2. [facility] is neither a transportation carrier nor a common or public carrier. 3. [facility] has no control over the quality or safety of the transportation that occurs as a result of the [employer]. 4. [place] [facility] cannot guarantee that any third-party transportation provider will complete any arranged transportation service. 5. [facility] makes no representation, warranty, or guarantee regarding the reliability, timeliness, quality, safety, suitability, or availability of any of the Transport Services or that they will be error free. 6. I fully understand that traveling by vehicle involves risks and dangers of serious bodily injury, including permanent disability, paralysis, and death. I agree, on behalf of myself and on behalf of any minor child using the [employer] Services for whom I am the parent or legal guardian, that the entire risk arising out of my use of the [employer] remains solely with me, to the maximum extent permitted under applicable law. 7. The [employer] are provided as is and as available. [facility] disclaims all representations and warranties, express, implied or statutory, not expressly set out in these terms, including the implied warranties of merchantability and fitness for a particular purpose. 8. I hereby waive and release [facility], its agents, employees, officers, directors, representatives, insurers, attorneys, assigns, successors, subsidiaries, and affiliates from any and all past, present, or future claims, demands, liabilities, actions, causes of action, or suits of any kind directly or indirectly arising from acceptance and use of the [employer]. 9. I further waive and release [facility] and its affiliates from all present and future liability and responsibility for any injury or death to persons or damages to property caused by or related to the use of the [employer]. 10. I have read this Waiver and Release of Liability, and I understand the terms used in it and their legal significance. This Waiver is freely and voluntarily given with the understanding that my right (as well as the right of any minor child for whom I am the parent or legal guardian using the [employer]) to legal recourse against [place] in connection with the [employer] is knowingly surrendered in return for use of these services.   I attest that I read the consent document to [name], gave Ms. Hollenbeck the opportunity to ask questions and answered the questions asked (if any). I affirm that [name] then provided consent for she's participation in this program.     Drucie Ip

## 2020-07-21 DIAGNOSIS — H353212 Exudative age-related macular degeneration, right eye, with inactive choroidal neovascularization: Secondary | ICD-10-CM | POA: Diagnosis not present

## 2020-10-14 DIAGNOSIS — K2100 Gastro-esophageal reflux disease with esophagitis, without bleeding: Secondary | ICD-10-CM | POA: Diagnosis not present

## 2020-10-14 DIAGNOSIS — E559 Vitamin D deficiency, unspecified: Secondary | ICD-10-CM | POA: Diagnosis not present

## 2020-10-14 DIAGNOSIS — R030 Elevated blood-pressure reading, without diagnosis of hypertension: Secondary | ICD-10-CM | POA: Diagnosis not present

## 2020-10-14 DIAGNOSIS — R600 Localized edema: Secondary | ICD-10-CM | POA: Diagnosis not present

## 2020-10-14 DIAGNOSIS — Z0000 Encounter for general adult medical examination without abnormal findings: Secondary | ICD-10-CM | POA: Diagnosis not present

## 2020-10-14 DIAGNOSIS — R7302 Impaired glucose tolerance (oral): Secondary | ICD-10-CM | POA: Diagnosis not present

## 2020-10-14 DIAGNOSIS — R69 Illness, unspecified: Secondary | ICD-10-CM | POA: Diagnosis not present

## 2020-10-14 DIAGNOSIS — I739 Peripheral vascular disease, unspecified: Secondary | ICD-10-CM | POA: Diagnosis not present

## 2020-10-14 DIAGNOSIS — M47817 Spondylosis without myelopathy or radiculopathy, lumbosacral region: Secondary | ICD-10-CM | POA: Diagnosis not present

## 2020-10-14 DIAGNOSIS — E7800 Pure hypercholesterolemia, unspecified: Secondary | ICD-10-CM | POA: Diagnosis not present

## 2021-01-26 DIAGNOSIS — H353212 Exudative age-related macular degeneration, right eye, with inactive choroidal neovascularization: Secondary | ICD-10-CM | POA: Diagnosis not present

## 2021-04-17 DIAGNOSIS — Z131 Encounter for screening for diabetes mellitus: Secondary | ICD-10-CM | POA: Diagnosis not present

## 2021-04-17 DIAGNOSIS — F419 Anxiety disorder, unspecified: Secondary | ICD-10-CM | POA: Diagnosis not present

## 2021-04-17 DIAGNOSIS — R600 Localized edema: Secondary | ICD-10-CM | POA: Diagnosis not present

## 2021-04-17 DIAGNOSIS — R030 Elevated blood-pressure reading, without diagnosis of hypertension: Secondary | ICD-10-CM | POA: Diagnosis not present

## 2021-04-17 DIAGNOSIS — R262 Difficulty in walking, not elsewhere classified: Secondary | ICD-10-CM | POA: Diagnosis not present

## 2021-04-17 DIAGNOSIS — R5381 Other malaise: Secondary | ICD-10-CM | POA: Diagnosis not present

## 2021-04-17 DIAGNOSIS — Z1329 Encounter for screening for other suspected endocrine disorder: Secondary | ICD-10-CM | POA: Diagnosis not present

## 2021-04-17 DIAGNOSIS — F4321 Adjustment disorder with depressed mood: Secondary | ICD-10-CM | POA: Diagnosis not present

## 2021-04-17 DIAGNOSIS — E7800 Pure hypercholesterolemia, unspecified: Secondary | ICD-10-CM | POA: Diagnosis not present

## 2021-04-17 DIAGNOSIS — R7302 Impaired glucose tolerance (oral): Secondary | ICD-10-CM | POA: Diagnosis not present

## 2021-04-26 DIAGNOSIS — M545 Low back pain, unspecified: Secondary | ICD-10-CM | POA: Diagnosis not present

## 2021-04-26 DIAGNOSIS — G8929 Other chronic pain: Secondary | ICD-10-CM | POA: Diagnosis not present

## 2021-04-26 DIAGNOSIS — R5381 Other malaise: Secondary | ICD-10-CM | POA: Diagnosis not present

## 2021-04-26 DIAGNOSIS — R2689 Other abnormalities of gait and mobility: Secondary | ICD-10-CM | POA: Diagnosis not present

## 2021-04-26 DIAGNOSIS — M6281 Muscle weakness (generalized): Secondary | ICD-10-CM | POA: Diagnosis not present

## 2021-05-01 DIAGNOSIS — M6281 Muscle weakness (generalized): Secondary | ICD-10-CM | POA: Diagnosis not present

## 2021-05-01 DIAGNOSIS — R2689 Other abnormalities of gait and mobility: Secondary | ICD-10-CM | POA: Diagnosis not present

## 2021-05-01 DIAGNOSIS — G8929 Other chronic pain: Secondary | ICD-10-CM | POA: Diagnosis not present

## 2021-05-01 DIAGNOSIS — M545 Low back pain, unspecified: Secondary | ICD-10-CM | POA: Diagnosis not present

## 2021-05-01 DIAGNOSIS — R5381 Other malaise: Secondary | ICD-10-CM | POA: Diagnosis not present

## 2021-05-08 DIAGNOSIS — G8929 Other chronic pain: Secondary | ICD-10-CM | POA: Diagnosis not present

## 2021-05-08 DIAGNOSIS — M545 Low back pain, unspecified: Secondary | ICD-10-CM | POA: Diagnosis not present

## 2021-05-08 DIAGNOSIS — R5381 Other malaise: Secondary | ICD-10-CM | POA: Diagnosis not present

## 2021-05-08 DIAGNOSIS — R2689 Other abnormalities of gait and mobility: Secondary | ICD-10-CM | POA: Diagnosis not present

## 2021-05-08 DIAGNOSIS — M6281 Muscle weakness (generalized): Secondary | ICD-10-CM | POA: Diagnosis not present

## 2021-07-27 DIAGNOSIS — H353122 Nonexudative age-related macular degeneration, left eye, intermediate dry stage: Secondary | ICD-10-CM | POA: Diagnosis not present

## 2021-08-12 DIAGNOSIS — I10 Essential (primary) hypertension: Secondary | ICD-10-CM | POA: Diagnosis not present

## 2021-08-17 HISTORY — PX: CARDIAC SURGERY: SHX584

## 2021-08-19 DIAGNOSIS — M533 Sacrococcygeal disorders, not elsewhere classified: Secondary | ICD-10-CM | POA: Diagnosis not present

## 2021-08-22 DIAGNOSIS — R52 Pain, unspecified: Secondary | ICD-10-CM | POA: Diagnosis not present

## 2021-08-22 DIAGNOSIS — M545 Low back pain, unspecified: Secondary | ICD-10-CM | POA: Diagnosis not present

## 2021-08-22 DIAGNOSIS — M5416 Radiculopathy, lumbar region: Secondary | ICD-10-CM | POA: Diagnosis not present

## 2021-08-24 ENCOUNTER — Other Ambulatory Visit
Admission: RE | Admit: 2021-08-24 | Discharge: 2021-08-24 | Disposition: A | Discharge status: Home / Self Care | Payer: Medicare HMO | Source: Ambulatory Visit | Attending: Internal Medicine | Admitting: Internal Medicine

## 2021-08-24 DIAGNOSIS — E7800 Pure hypercholesterolemia, unspecified: Secondary | ICD-10-CM | POA: Diagnosis not present

## 2021-08-24 DIAGNOSIS — Z7722 Contact with and (suspected) exposure to environmental tobacco smoke (acute) (chronic): Secondary | ICD-10-CM | POA: Diagnosis not present

## 2021-08-24 DIAGNOSIS — R0989 Other specified symptoms and signs involving the circulatory and respiratory systems: Secondary | ICD-10-CM | POA: Diagnosis not present

## 2021-08-24 DIAGNOSIS — R0602 Shortness of breath: Secondary | ICD-10-CM | POA: Diagnosis not present

## 2021-08-24 DIAGNOSIS — Z79899 Other long term (current) drug therapy: Secondary | ICD-10-CM | POA: Insufficient documentation

## 2021-08-24 DIAGNOSIS — R600 Localized edema: Secondary | ICD-10-CM | POA: Insufficient documentation

## 2021-08-24 DIAGNOSIS — I2510 Atherosclerotic heart disease of native coronary artery without angina pectoris: Secondary | ICD-10-CM | POA: Insufficient documentation

## 2021-08-24 DIAGNOSIS — I739 Peripheral vascular disease, unspecified: Secondary | ICD-10-CM | POA: Diagnosis not present

## 2021-08-24 DIAGNOSIS — R262 Difficulty in walking, not elsewhere classified: Secondary | ICD-10-CM | POA: Diagnosis not present

## 2021-08-24 LAB — BRAIN NATRIURETIC PEPTIDE: B Natriuretic Peptide: 818.4 pg/mL — ABNORMAL HIGH (ref 0.0–100.0)

## 2021-08-30 DIAGNOSIS — M5416 Radiculopathy, lumbar region: Secondary | ICD-10-CM | POA: Diagnosis not present

## 2021-09-01 DIAGNOSIS — M5416 Radiculopathy, lumbar region: Secondary | ICD-10-CM | POA: Diagnosis not present

## 2021-09-13 DIAGNOSIS — M7989 Other specified soft tissue disorders: Secondary | ICD-10-CM | POA: Diagnosis not present

## 2021-09-13 DIAGNOSIS — K5792 Diverticulitis of intestine, part unspecified, without perforation or abscess without bleeding: Secondary | ICD-10-CM | POA: Diagnosis not present

## 2021-09-13 DIAGNOSIS — I5043 Acute on chronic combined systolic (congestive) and diastolic (congestive) heart failure: Secondary | ICD-10-CM | POA: Diagnosis not present

## 2021-09-13 DIAGNOSIS — Z9221 Personal history of antineoplastic chemotherapy: Secondary | ICD-10-CM | POA: Diagnosis not present

## 2021-09-13 DIAGNOSIS — I130 Hypertensive heart and chronic kidney disease with heart failure and stage 1 through stage 4 chronic kidney disease, or unspecified chronic kidney disease: Secondary | ICD-10-CM | POA: Diagnosis not present

## 2021-09-13 DIAGNOSIS — I2510 Atherosclerotic heart disease of native coronary artery without angina pectoris: Secondary | ICD-10-CM | POA: Diagnosis not present

## 2021-09-13 DIAGNOSIS — I214 Non-ST elevation (NSTEMI) myocardial infarction: Secondary | ICD-10-CM

## 2021-09-13 DIAGNOSIS — E785 Hyperlipidemia, unspecified: Secondary | ICD-10-CM | POA: Diagnosis not present

## 2021-09-13 DIAGNOSIS — Z66 Do not resuscitate: Secondary | ICD-10-CM | POA: Diagnosis not present

## 2021-09-13 DIAGNOSIS — N184 Chronic kidney disease, stage 4 (severe): Secondary | ICD-10-CM | POA: Diagnosis not present

## 2021-09-13 DIAGNOSIS — F32A Depression, unspecified: Secondary | ICD-10-CM | POA: Diagnosis not present

## 2021-09-13 DIAGNOSIS — R0789 Other chest pain: Secondary | ICD-10-CM | POA: Diagnosis not present

## 2021-09-13 DIAGNOSIS — J45909 Unspecified asthma, uncomplicated: Secondary | ICD-10-CM | POA: Diagnosis not present

## 2021-09-13 DIAGNOSIS — I25118 Atherosclerotic heart disease of native coronary artery with other forms of angina pectoris: Secondary | ICD-10-CM | POA: Diagnosis not present

## 2021-09-13 DIAGNOSIS — R0602 Shortness of breath: Secondary | ICD-10-CM | POA: Diagnosis not present

## 2021-09-13 DIAGNOSIS — M79605 Pain in left leg: Secondary | ICD-10-CM | POA: Diagnosis not present

## 2021-09-13 DIAGNOSIS — I2119 ST elevation (STEMI) myocardial infarction involving other coronary artery of inferior wall: Secondary | ICD-10-CM | POA: Diagnosis not present

## 2021-09-13 DIAGNOSIS — D631 Anemia in chronic kidney disease: Secondary | ICD-10-CM | POA: Diagnosis not present

## 2021-09-13 DIAGNOSIS — R0609 Other forms of dyspnea: Secondary | ICD-10-CM | POA: Diagnosis not present

## 2021-09-13 DIAGNOSIS — I5021 Acute systolic (congestive) heart failure: Secondary | ICD-10-CM | POA: Diagnosis not present

## 2021-09-13 DIAGNOSIS — E875 Hyperkalemia: Secondary | ICD-10-CM | POA: Diagnosis not present

## 2021-09-13 DIAGNOSIS — R600 Localized edema: Secondary | ICD-10-CM | POA: Diagnosis not present

## 2021-09-13 DIAGNOSIS — I5023 Acute on chronic systolic (congestive) heart failure: Secondary | ICD-10-CM | POA: Diagnosis not present

## 2021-09-13 HISTORY — DX: Non-ST elevation (NSTEMI) myocardial infarction: I21.4

## 2021-09-14 DIAGNOSIS — I5021 Acute systolic (congestive) heart failure: Secondary | ICD-10-CM | POA: Diagnosis not present

## 2021-09-14 DIAGNOSIS — R0609 Other forms of dyspnea: Secondary | ICD-10-CM | POA: Diagnosis not present

## 2021-09-14 DIAGNOSIS — I5020 Unspecified systolic (congestive) heart failure: Secondary | ICD-10-CM

## 2021-09-14 DIAGNOSIS — N184 Chronic kidney disease, stage 4 (severe): Secondary | ICD-10-CM | POA: Diagnosis not present

## 2021-09-14 DIAGNOSIS — M79605 Pain in left leg: Secondary | ICD-10-CM | POA: Diagnosis not present

## 2021-09-14 DIAGNOSIS — I2510 Atherosclerotic heart disease of native coronary artery without angina pectoris: Secondary | ICD-10-CM | POA: Diagnosis not present

## 2021-09-14 DIAGNOSIS — I2119 ST elevation (STEMI) myocardial infarction involving other coronary artery of inferior wall: Secondary | ICD-10-CM | POA: Diagnosis not present

## 2021-09-14 HISTORY — DX: Unspecified systolic (congestive) heart failure: I50.20

## 2021-09-15 DIAGNOSIS — R0609 Other forms of dyspnea: Secondary | ICD-10-CM | POA: Diagnosis not present

## 2021-09-15 DIAGNOSIS — I2119 ST elevation (STEMI) myocardial infarction involving other coronary artery of inferior wall: Secondary | ICD-10-CM | POA: Diagnosis not present

## 2021-09-15 DIAGNOSIS — I5021 Acute systolic (congestive) heart failure: Secondary | ICD-10-CM | POA: Diagnosis not present

## 2021-09-15 DIAGNOSIS — N184 Chronic kidney disease, stage 4 (severe): Secondary | ICD-10-CM | POA: Diagnosis not present

## 2021-09-15 DIAGNOSIS — R0602 Shortness of breath: Secondary | ICD-10-CM | POA: Diagnosis not present

## 2021-09-15 DIAGNOSIS — E785 Hyperlipidemia, unspecified: Secondary | ICD-10-CM | POA: Diagnosis not present

## 2021-09-15 DIAGNOSIS — I2510 Atherosclerotic heart disease of native coronary artery without angina pectoris: Secondary | ICD-10-CM | POA: Diagnosis not present

## 2021-09-15 DIAGNOSIS — M79605 Pain in left leg: Secondary | ICD-10-CM | POA: Diagnosis not present

## 2021-09-15 DIAGNOSIS — I5023 Acute on chronic systolic (congestive) heart failure: Secondary | ICD-10-CM | POA: Diagnosis not present

## 2021-09-15 HISTORY — PX: CORONARY ANGIOPLASTY WITH STENT PLACEMENT: SHX49

## 2021-09-16 DIAGNOSIS — I2119 ST elevation (STEMI) myocardial infarction involving other coronary artery of inferior wall: Secondary | ICD-10-CM | POA: Diagnosis not present

## 2021-09-16 DIAGNOSIS — I2510 Atherosclerotic heart disease of native coronary artery without angina pectoris: Secondary | ICD-10-CM | POA: Diagnosis not present

## 2021-09-16 DIAGNOSIS — M79605 Pain in left leg: Secondary | ICD-10-CM | POA: Diagnosis not present

## 2021-09-16 DIAGNOSIS — N184 Chronic kidney disease, stage 4 (severe): Secondary | ICD-10-CM | POA: Diagnosis not present

## 2021-09-16 DIAGNOSIS — I5021 Acute systolic (congestive) heart failure: Secondary | ICD-10-CM | POA: Diagnosis not present

## 2021-09-16 DIAGNOSIS — R0609 Other forms of dyspnea: Secondary | ICD-10-CM | POA: Diagnosis not present

## 2021-09-17 DIAGNOSIS — K5792 Diverticulitis of intestine, part unspecified, without perforation or abscess without bleeding: Secondary | ICD-10-CM | POA: Diagnosis not present

## 2021-09-17 DIAGNOSIS — I130 Hypertensive heart and chronic kidney disease with heart failure and stage 1 through stage 4 chronic kidney disease, or unspecified chronic kidney disease: Secondary | ICD-10-CM | POA: Diagnosis not present

## 2021-09-17 DIAGNOSIS — Z66 Do not resuscitate: Secondary | ICD-10-CM | POA: Diagnosis not present

## 2021-09-17 DIAGNOSIS — I2119 ST elevation (STEMI) myocardial infarction involving other coronary artery of inferior wall: Secondary | ICD-10-CM | POA: Diagnosis not present

## 2021-09-17 DIAGNOSIS — J45909 Unspecified asthma, uncomplicated: Secondary | ICD-10-CM | POA: Diagnosis not present

## 2021-09-17 DIAGNOSIS — I214 Non-ST elevation (NSTEMI) myocardial infarction: Secondary | ICD-10-CM | POA: Diagnosis not present

## 2021-09-17 DIAGNOSIS — R0602 Shortness of breath: Secondary | ICD-10-CM | POA: Diagnosis not present

## 2021-09-17 DIAGNOSIS — I5021 Acute systolic (congestive) heart failure: Secondary | ICD-10-CM | POA: Diagnosis not present

## 2021-09-17 DIAGNOSIS — N184 Chronic kidney disease, stage 4 (severe): Secondary | ICD-10-CM | POA: Diagnosis not present

## 2021-09-17 DIAGNOSIS — M7989 Other specified soft tissue disorders: Secondary | ICD-10-CM | POA: Diagnosis not present

## 2021-09-17 DIAGNOSIS — I5043 Acute on chronic combined systolic (congestive) and diastolic (congestive) heart failure: Secondary | ICD-10-CM | POA: Diagnosis not present

## 2021-09-17 DIAGNOSIS — F32A Depression, unspecified: Secondary | ICD-10-CM | POA: Diagnosis not present

## 2021-09-17 DIAGNOSIS — D631 Anemia in chronic kidney disease: Secondary | ICD-10-CM | POA: Diagnosis not present

## 2021-09-17 DIAGNOSIS — E785 Hyperlipidemia, unspecified: Secondary | ICD-10-CM | POA: Diagnosis not present

## 2021-09-17 DIAGNOSIS — R0609 Other forms of dyspnea: Secondary | ICD-10-CM | POA: Diagnosis not present

## 2021-09-17 DIAGNOSIS — M79605 Pain in left leg: Secondary | ICD-10-CM | POA: Diagnosis not present

## 2021-09-17 DIAGNOSIS — I25118 Atherosclerotic heart disease of native coronary artery with other forms of angina pectoris: Secondary | ICD-10-CM | POA: Diagnosis not present

## 2021-09-17 DIAGNOSIS — I2510 Atherosclerotic heart disease of native coronary artery without angina pectoris: Secondary | ICD-10-CM | POA: Diagnosis not present

## 2021-09-19 HISTORY — PX: CORONARY ANGIOPLASTY WITH STENT PLACEMENT: SHX49

## 2021-09-27 DIAGNOSIS — R0602 Shortness of breath: Secondary | ICD-10-CM | POA: Diagnosis not present

## 2021-09-27 DIAGNOSIS — I2119 ST elevation (STEMI) myocardial infarction involving other coronary artery of inferior wall: Secondary | ICD-10-CM | POA: Diagnosis not present

## 2021-09-27 DIAGNOSIS — I2510 Atherosclerotic heart disease of native coronary artery without angina pectoris: Secondary | ICD-10-CM | POA: Diagnosis not present

## 2021-09-27 DIAGNOSIS — N184 Chronic kidney disease, stage 4 (severe): Secondary | ICD-10-CM | POA: Diagnosis not present

## 2021-09-27 DIAGNOSIS — R0609 Other forms of dyspnea: Secondary | ICD-10-CM | POA: Diagnosis not present

## 2021-09-27 DIAGNOSIS — R079 Chest pain, unspecified: Secondary | ICD-10-CM | POA: Diagnosis not present

## 2021-09-27 DIAGNOSIS — I5021 Acute systolic (congestive) heart failure: Secondary | ICD-10-CM | POA: Diagnosis not present

## 2021-09-27 DIAGNOSIS — E1122 Type 2 diabetes mellitus with diabetic chronic kidney disease: Secondary | ICD-10-CM | POA: Diagnosis not present

## 2021-09-27 DIAGNOSIS — I739 Peripheral vascular disease, unspecified: Secondary | ICD-10-CM | POA: Diagnosis not present

## 2021-09-27 DIAGNOSIS — E1151 Type 2 diabetes mellitus with diabetic peripheral angiopathy without gangrene: Secondary | ICD-10-CM | POA: Diagnosis not present

## 2021-09-27 DIAGNOSIS — R600 Localized edema: Secondary | ICD-10-CM | POA: Diagnosis not present

## 2021-10-19 DIAGNOSIS — R202 Paresthesia of skin: Secondary | ICD-10-CM | POA: Diagnosis not present

## 2021-10-19 DIAGNOSIS — N184 Chronic kidney disease, stage 4 (severe): Secondary | ICD-10-CM | POA: Diagnosis not present

## 2021-10-19 DIAGNOSIS — R200 Anesthesia of skin: Secondary | ICD-10-CM | POA: Diagnosis not present

## 2021-10-19 DIAGNOSIS — E7800 Pure hypercholesterolemia, unspecified: Secondary | ICD-10-CM | POA: Diagnosis not present

## 2021-10-19 DIAGNOSIS — E538 Deficiency of other specified B group vitamins: Secondary | ICD-10-CM | POA: Diagnosis not present

## 2021-10-19 DIAGNOSIS — R0602 Shortness of breath: Secondary | ICD-10-CM | POA: Diagnosis not present

## 2021-10-19 DIAGNOSIS — I2510 Atherosclerotic heart disease of native coronary artery without angina pectoris: Secondary | ICD-10-CM | POA: Diagnosis not present

## 2021-10-19 DIAGNOSIS — Z1329 Encounter for screening for other suspected endocrine disorder: Secondary | ICD-10-CM | POA: Diagnosis not present

## 2021-10-19 DIAGNOSIS — M4722 Other spondylosis with radiculopathy, cervical region: Secondary | ICD-10-CM | POA: Diagnosis not present

## 2021-10-19 DIAGNOSIS — I4891 Unspecified atrial fibrillation: Secondary | ICD-10-CM | POA: Diagnosis not present

## 2021-10-19 DIAGNOSIS — I5043 Acute on chronic combined systolic (congestive) and diastolic (congestive) heart failure: Secondary | ICD-10-CM | POA: Diagnosis not present

## 2021-10-19 DIAGNOSIS — Z0000 Encounter for general adult medical examination without abnormal findings: Secondary | ICD-10-CM | POA: Diagnosis not present

## 2021-10-19 DIAGNOSIS — R7302 Impaired glucose tolerance (oral): Secondary | ICD-10-CM | POA: Diagnosis not present

## 2021-10-19 DIAGNOSIS — I739 Peripheral vascular disease, unspecified: Secondary | ICD-10-CM | POA: Diagnosis not present

## 2021-10-19 DIAGNOSIS — R030 Elevated blood-pressure reading, without diagnosis of hypertension: Secondary | ICD-10-CM | POA: Diagnosis not present

## 2021-11-09 DIAGNOSIS — M9903 Segmental and somatic dysfunction of lumbar region: Secondary | ICD-10-CM | POA: Diagnosis not present

## 2021-11-09 DIAGNOSIS — M9905 Segmental and somatic dysfunction of pelvic region: Secondary | ICD-10-CM | POA: Diagnosis not present

## 2021-11-09 DIAGNOSIS — M5417 Radiculopathy, lumbosacral region: Secondary | ICD-10-CM | POA: Diagnosis not present

## 2021-11-09 DIAGNOSIS — M4306 Spondylolysis, lumbar region: Secondary | ICD-10-CM | POA: Diagnosis not present

## 2021-11-10 DIAGNOSIS — M9905 Segmental and somatic dysfunction of pelvic region: Secondary | ICD-10-CM | POA: Diagnosis not present

## 2021-11-10 DIAGNOSIS — M5417 Radiculopathy, lumbosacral region: Secondary | ICD-10-CM | POA: Diagnosis not present

## 2021-11-10 DIAGNOSIS — M4306 Spondylolysis, lumbar region: Secondary | ICD-10-CM | POA: Diagnosis not present

## 2021-11-10 DIAGNOSIS — M9903 Segmental and somatic dysfunction of lumbar region: Secondary | ICD-10-CM | POA: Diagnosis not present

## 2021-11-13 DIAGNOSIS — M4306 Spondylolysis, lumbar region: Secondary | ICD-10-CM | POA: Diagnosis not present

## 2021-11-13 DIAGNOSIS — M9903 Segmental and somatic dysfunction of lumbar region: Secondary | ICD-10-CM | POA: Diagnosis not present

## 2021-11-13 DIAGNOSIS — M5417 Radiculopathy, lumbosacral region: Secondary | ICD-10-CM | POA: Diagnosis not present

## 2021-11-13 DIAGNOSIS — M9905 Segmental and somatic dysfunction of pelvic region: Secondary | ICD-10-CM | POA: Diagnosis not present

## 2021-11-15 DIAGNOSIS — M5417 Radiculopathy, lumbosacral region: Secondary | ICD-10-CM | POA: Diagnosis not present

## 2021-11-15 DIAGNOSIS — M4306 Spondylolysis, lumbar region: Secondary | ICD-10-CM | POA: Diagnosis not present

## 2021-11-15 DIAGNOSIS — M9905 Segmental and somatic dysfunction of pelvic region: Secondary | ICD-10-CM | POA: Diagnosis not present

## 2021-11-15 DIAGNOSIS — M9903 Segmental and somatic dysfunction of lumbar region: Secondary | ICD-10-CM | POA: Diagnosis not present

## 2021-11-17 DIAGNOSIS — M9903 Segmental and somatic dysfunction of lumbar region: Secondary | ICD-10-CM | POA: Diagnosis not present

## 2021-11-17 DIAGNOSIS — M5417 Radiculopathy, lumbosacral region: Secondary | ICD-10-CM | POA: Diagnosis not present

## 2021-11-17 DIAGNOSIS — M4306 Spondylolysis, lumbar region: Secondary | ICD-10-CM | POA: Diagnosis not present

## 2021-11-17 DIAGNOSIS — M9905 Segmental and somatic dysfunction of pelvic region: Secondary | ICD-10-CM | POA: Diagnosis not present

## 2021-11-21 DIAGNOSIS — M4306 Spondylolysis, lumbar region: Secondary | ICD-10-CM | POA: Diagnosis not present

## 2021-11-21 DIAGNOSIS — M5417 Radiculopathy, lumbosacral region: Secondary | ICD-10-CM | POA: Diagnosis not present

## 2021-11-21 DIAGNOSIS — M9903 Segmental and somatic dysfunction of lumbar region: Secondary | ICD-10-CM | POA: Diagnosis not present

## 2021-11-21 DIAGNOSIS — M9905 Segmental and somatic dysfunction of pelvic region: Secondary | ICD-10-CM | POA: Diagnosis not present

## 2021-11-24 DIAGNOSIS — M9903 Segmental and somatic dysfunction of lumbar region: Secondary | ICD-10-CM | POA: Diagnosis not present

## 2021-11-24 DIAGNOSIS — M5417 Radiculopathy, lumbosacral region: Secondary | ICD-10-CM | POA: Diagnosis not present

## 2021-11-24 DIAGNOSIS — M9905 Segmental and somatic dysfunction of pelvic region: Secondary | ICD-10-CM | POA: Diagnosis not present

## 2021-11-24 DIAGNOSIS — M4306 Spondylolysis, lumbar region: Secondary | ICD-10-CM | POA: Diagnosis not present

## 2021-11-27 DIAGNOSIS — M4306 Spondylolysis, lumbar region: Secondary | ICD-10-CM | POA: Diagnosis not present

## 2021-11-27 DIAGNOSIS — M9905 Segmental and somatic dysfunction of pelvic region: Secondary | ICD-10-CM | POA: Diagnosis not present

## 2021-11-27 DIAGNOSIS — M9903 Segmental and somatic dysfunction of lumbar region: Secondary | ICD-10-CM | POA: Diagnosis not present

## 2021-11-27 DIAGNOSIS — M5417 Radiculopathy, lumbosacral region: Secondary | ICD-10-CM | POA: Diagnosis not present

## 2021-12-06 DIAGNOSIS — M5416 Radiculopathy, lumbar region: Secondary | ICD-10-CM | POA: Diagnosis not present

## 2021-12-20 DIAGNOSIS — M48061 Spinal stenosis, lumbar region without neurogenic claudication: Secondary | ICD-10-CM | POA: Diagnosis not present

## 2021-12-20 DIAGNOSIS — M5416 Radiculopathy, lumbar region: Secondary | ICD-10-CM | POA: Diagnosis not present

## 2022-01-04 DIAGNOSIS — Z01818 Encounter for other preprocedural examination: Secondary | ICD-10-CM | POA: Diagnosis not present

## 2022-01-04 DIAGNOSIS — I480 Paroxysmal atrial fibrillation: Secondary | ICD-10-CM | POA: Diagnosis not present

## 2022-01-04 DIAGNOSIS — N182 Chronic kidney disease, stage 2 (mild): Secondary | ICD-10-CM | POA: Diagnosis not present

## 2022-01-04 DIAGNOSIS — N184 Chronic kidney disease, stage 4 (severe): Secondary | ICD-10-CM | POA: Diagnosis not present

## 2022-01-04 DIAGNOSIS — I10 Essential (primary) hypertension: Secondary | ICD-10-CM | POA: Diagnosis not present

## 2022-01-04 DIAGNOSIS — I2510 Atherosclerotic heart disease of native coronary artery without angina pectoris: Secondary | ICD-10-CM | POA: Diagnosis not present

## 2022-01-04 DIAGNOSIS — I739 Peripheral vascular disease, unspecified: Secondary | ICD-10-CM | POA: Diagnosis not present

## 2022-01-04 DIAGNOSIS — R0609 Other forms of dyspnea: Secondary | ICD-10-CM | POA: Diagnosis not present

## 2022-01-04 DIAGNOSIS — R600 Localized edema: Secondary | ICD-10-CM | POA: Diagnosis not present

## 2022-01-25 DIAGNOSIS — H353122 Nonexudative age-related macular degeneration, left eye, intermediate dry stage: Secondary | ICD-10-CM | POA: Diagnosis not present

## 2022-01-25 DIAGNOSIS — H02055 Trichiasis without entropian left lower eyelid: Secondary | ICD-10-CM | POA: Diagnosis not present

## 2022-01-25 DIAGNOSIS — H353210 Exudative age-related macular degeneration, right eye, stage unspecified: Secondary | ICD-10-CM | POA: Diagnosis not present

## 2022-01-29 DIAGNOSIS — M546 Pain in thoracic spine: Secondary | ICD-10-CM | POA: Diagnosis not present

## 2022-01-29 DIAGNOSIS — R200 Anesthesia of skin: Secondary | ICD-10-CM | POA: Diagnosis not present

## 2022-01-29 DIAGNOSIS — Z09 Encounter for follow-up examination after completed treatment for conditions other than malignant neoplasm: Secondary | ICD-10-CM | POA: Diagnosis not present

## 2022-01-29 DIAGNOSIS — M4722 Other spondylosis with radiculopathy, cervical region: Secondary | ICD-10-CM | POA: Diagnosis not present

## 2022-01-29 DIAGNOSIS — I4891 Unspecified atrial fibrillation: Secondary | ICD-10-CM | POA: Diagnosis not present

## 2022-01-29 DIAGNOSIS — M4807 Spinal stenosis, lumbosacral region: Secondary | ICD-10-CM | POA: Diagnosis not present

## 2022-01-29 DIAGNOSIS — R202 Paresthesia of skin: Secondary | ICD-10-CM | POA: Diagnosis not present

## 2022-01-29 DIAGNOSIS — I5043 Acute on chronic combined systolic (congestive) and diastolic (congestive) heart failure: Secondary | ICD-10-CM | POA: Diagnosis not present

## 2022-01-29 DIAGNOSIS — I110 Hypertensive heart disease with heart failure: Secondary | ICD-10-CM | POA: Diagnosis not present

## 2022-02-01 DIAGNOSIS — M5416 Radiculopathy, lumbar region: Secondary | ICD-10-CM | POA: Diagnosis not present

## 2022-02-01 DIAGNOSIS — M25552 Pain in left hip: Secondary | ICD-10-CM | POA: Diagnosis not present

## 2022-02-01 DIAGNOSIS — M48061 Spinal stenosis, lumbar region without neurogenic claudication: Secondary | ICD-10-CM | POA: Diagnosis not present

## 2022-02-08 DIAGNOSIS — R4182 Altered mental status, unspecified: Secondary | ICD-10-CM | POA: Diagnosis not present

## 2022-02-08 DIAGNOSIS — F1190 Opioid use, unspecified, uncomplicated: Secondary | ICD-10-CM | POA: Diagnosis not present

## 2022-02-13 ENCOUNTER — Inpatient Hospital Stay
Admission: EM | Admit: 2022-02-13 | Discharge: 2022-02-15 | DRG: 641 | Disposition: A | Discharge status: Home Health | Payer: Medicare HMO | Attending: Internal Medicine | Admitting: Internal Medicine

## 2022-02-13 ENCOUNTER — Emergency Department: Payer: Medicare HMO

## 2022-02-13 ENCOUNTER — Other Ambulatory Visit: Payer: Self-pay

## 2022-02-13 ENCOUNTER — Ambulatory Visit
Admission: EM | Admit: 2022-02-13 | Discharge: 2022-02-13 | Disposition: A | Discharge status: Home / Self Care | Payer: Medicare HMO | Source: Home / Self Care

## 2022-02-13 DIAGNOSIS — Z79899 Other long term (current) drug therapy: Secondary | ICD-10-CM | POA: Diagnosis not present

## 2022-02-13 DIAGNOSIS — Z7902 Long term (current) use of antithrombotics/antiplatelets: Secondary | ICD-10-CM | POA: Diagnosis not present

## 2022-02-13 DIAGNOSIS — Z79891 Long term (current) use of opiate analgesic: Secondary | ICD-10-CM | POA: Diagnosis not present

## 2022-02-13 DIAGNOSIS — I2510 Atherosclerotic heart disease of native coronary artery without angina pectoris: Secondary | ICD-10-CM | POA: Diagnosis present

## 2022-02-13 DIAGNOSIS — R000 Tachycardia, unspecified: Secondary | ICD-10-CM | POA: Diagnosis not present

## 2022-02-13 DIAGNOSIS — I130 Hypertensive heart and chronic kidney disease with heart failure and stage 1 through stage 4 chronic kidney disease, or unspecified chronic kidney disease: Secondary | ICD-10-CM | POA: Diagnosis present

## 2022-02-13 DIAGNOSIS — G319 Degenerative disease of nervous system, unspecified: Secondary | ICD-10-CM | POA: Diagnosis not present

## 2022-02-13 DIAGNOSIS — R29818 Other symptoms and signs involving the nervous system: Secondary | ICD-10-CM | POA: Diagnosis not present

## 2022-02-13 DIAGNOSIS — I480 Paroxysmal atrial fibrillation: Secondary | ICD-10-CM | POA: Diagnosis not present

## 2022-02-13 DIAGNOSIS — I739 Peripheral vascular disease, unspecified: Secondary | ICD-10-CM | POA: Diagnosis present

## 2022-02-13 DIAGNOSIS — I5032 Chronic diastolic (congestive) heart failure: Secondary | ICD-10-CM | POA: Diagnosis not present

## 2022-02-13 DIAGNOSIS — M5432 Sciatica, left side: Secondary | ICD-10-CM | POA: Insufficient documentation

## 2022-02-13 DIAGNOSIS — R778 Other specified abnormalities of plasma proteins: Secondary | ICD-10-CM | POA: Diagnosis present

## 2022-02-13 DIAGNOSIS — Z801 Family history of malignant neoplasm of trachea, bronchus and lung: Secondary | ICD-10-CM

## 2022-02-13 DIAGNOSIS — J323 Chronic sphenoidal sinusitis: Secondary | ICD-10-CM | POA: Diagnosis not present

## 2022-02-13 DIAGNOSIS — R112 Nausea with vomiting, unspecified: Secondary | ICD-10-CM

## 2022-02-13 DIAGNOSIS — Z955 Presence of coronary angioplasty implant and graft: Secondary | ICD-10-CM

## 2022-02-13 DIAGNOSIS — G8929 Other chronic pain: Secondary | ICD-10-CM | POA: Diagnosis present

## 2022-02-13 DIAGNOSIS — Z8249 Family history of ischemic heart disease and other diseases of the circulatory system: Secondary | ICD-10-CM | POA: Diagnosis not present

## 2022-02-13 DIAGNOSIS — R9431 Abnormal electrocardiogram [ECG] [EKG]: Secondary | ICD-10-CM

## 2022-02-13 DIAGNOSIS — I10 Essential (primary) hypertension: Secondary | ICD-10-CM | POA: Diagnosis not present

## 2022-02-13 DIAGNOSIS — R531 Weakness: Secondary | ICD-10-CM | POA: Diagnosis not present

## 2022-02-13 DIAGNOSIS — R7989 Other specified abnormal findings of blood chemistry: Secondary | ICD-10-CM

## 2022-02-13 DIAGNOSIS — A084 Viral intestinal infection, unspecified: Secondary | ICD-10-CM | POA: Insufficient documentation

## 2022-02-13 DIAGNOSIS — I447 Left bundle-branch block, unspecified: Secondary | ICD-10-CM | POA: Diagnosis present

## 2022-02-13 DIAGNOSIS — E872 Acidosis, unspecified: Secondary | ICD-10-CM | POA: Diagnosis not present

## 2022-02-13 DIAGNOSIS — Z7901 Long term (current) use of anticoagulants: Secondary | ICD-10-CM

## 2022-02-13 DIAGNOSIS — Z791 Long term (current) use of non-steroidal anti-inflammatories (NSAID): Secondary | ICD-10-CM

## 2022-02-13 DIAGNOSIS — E860 Dehydration: Secondary | ICD-10-CM | POA: Diagnosis present

## 2022-02-13 DIAGNOSIS — M5430 Sciatica, unspecified side: Secondary | ICD-10-CM

## 2022-02-13 DIAGNOSIS — I4581 Long QT syndrome: Secondary | ICD-10-CM | POA: Diagnosis not present

## 2022-02-13 DIAGNOSIS — N1832 Chronic kidney disease, stage 3b: Secondary | ICD-10-CM

## 2022-02-13 DIAGNOSIS — E871 Hypo-osmolality and hyponatremia: Secondary | ICD-10-CM | POA: Diagnosis not present

## 2022-02-13 DIAGNOSIS — G4489 Other headache syndrome: Secondary | ICD-10-CM | POA: Diagnosis not present

## 2022-02-13 DIAGNOSIS — M5442 Lumbago with sciatica, left side: Secondary | ICD-10-CM | POA: Diagnosis not present

## 2022-02-13 DIAGNOSIS — E861 Hypovolemia: Secondary | ICD-10-CM | POA: Diagnosis present

## 2022-02-13 DIAGNOSIS — I6782 Cerebral ischemia: Secondary | ICD-10-CM | POA: Diagnosis not present

## 2022-02-13 DIAGNOSIS — R110 Nausea: Secondary | ICD-10-CM

## 2022-02-13 DIAGNOSIS — R609 Edema, unspecified: Secondary | ICD-10-CM | POA: Diagnosis not present

## 2022-02-13 LAB — CBC
HCT: 39.6 % (ref 36.0–46.0)
Hemoglobin: 13.8 g/dL (ref 12.0–15.0)
MCH: 32.2 pg (ref 26.0–34.0)
MCHC: 34.8 g/dL (ref 30.0–36.0)
MCV: 92.5 fL (ref 80.0–100.0)
Platelets: 259 10*3/uL (ref 150–400)
RBC: 4.28 MIL/uL (ref 3.87–5.11)
RDW: 12.1 % (ref 11.5–15.5)
WBC: 8.3 10*3/uL (ref 4.0–10.5)
nRBC: 0 % (ref 0.0–0.2)

## 2022-02-13 LAB — COMPREHENSIVE METABOLIC PANEL
ALT: 15 U/L (ref 0–44)
AST: 24 U/L (ref 15–41)
Albumin: 4.1 g/dL (ref 3.5–5.0)
Alkaline Phosphatase: 45 U/L (ref 38–126)
Anion gap: 13 (ref 5–15)
BUN: 31 mg/dL — ABNORMAL HIGH (ref 8–23)
CO2: 20 mmol/L — ABNORMAL LOW (ref 22–32)
Calcium: 9.4 mg/dL (ref 8.9–10.3)
Chloride: 92 mmol/L — ABNORMAL LOW (ref 98–111)
Creatinine, Ser: 1.29 mg/dL — ABNORMAL HIGH (ref 0.44–1.00)
GFR, Estimated: 39 mL/min — ABNORMAL LOW (ref >60)
Glucose, Bld: 102 mg/dL — ABNORMAL HIGH (ref 70–99)
Potassium: 4.4 mmol/L (ref 3.5–5.1)
Sodium: 125 mmol/L — ABNORMAL LOW (ref 135–145)
Total Bilirubin: 1.1 mg/dL (ref 0.3–1.2)
Total Protein: 7.6 g/dL (ref 6.5–8.1)

## 2022-02-13 LAB — URINALYSIS, ROUTINE W REFLEX MICROSCOPIC
Bilirubin Urine: NEGATIVE
Glucose, UA: 500 mg/dL — AB
Leukocytes,Ua: NEGATIVE
Nitrite: NEGATIVE
Protein, ur: NEGATIVE mg/dL
Specific Gravity, Urine: 1.01 (ref 1.005–1.030)
pH: 7 (ref 5.0–8.0)

## 2022-02-13 LAB — OSMOLALITY, URINE: Osmolality, Ur: 447 mosm/kg (ref 300–900)

## 2022-02-13 LAB — OSMOLALITY: Osmolality: 270 mOsm/kg — ABNORMAL LOW (ref 275–295)

## 2022-02-13 LAB — URINALYSIS, MICROSCOPIC (REFLEX): WBC, UA: NONE SEEN WBC/hpf (ref 0–5)

## 2022-02-13 LAB — SODIUM, URINE, RANDOM: Sodium, Ur: 69 mmol/L

## 2022-02-13 LAB — TROPONIN I (HIGH SENSITIVITY)
Troponin I (High Sensitivity): 24 ng/L — ABNORMAL HIGH (ref <18)
Troponin I (High Sensitivity): 27 ng/L — ABNORMAL HIGH (ref <18)

## 2022-02-13 LAB — SODIUM: Sodium: 125 mmol/L — ABNORMAL LOW (ref 135–145)

## 2022-02-13 LAB — APTT: aPTT: 30 seconds (ref 24–36)

## 2022-02-13 LAB — CREATININE, URINE, RANDOM: Creatinine, Urine: 46 mg/dL

## 2022-02-13 LAB — CBG MONITORING, ED: Glucose-Capillary: 99 mg/dL (ref 70–99)

## 2022-02-13 LAB — LIPASE, BLOOD: Lipase: 46 U/L (ref 11–51)

## 2022-02-13 MED ORDER — INSULIN ASPART 100 UNIT/ML IJ SOLN: 0.0000 [IU] | Freq: Three times a day (TID) | INTRAMUSCULAR | Status: DC

## 2022-02-13 MED ORDER — ENOXAPARIN SODIUM 30 MG/0.3ML IJ SOSY
30.0000 mg | PREFILLED_SYRINGE | INTRAMUSCULAR | Status: DC
Start: 2022-02-13 — End: 2022-02-13

## 2022-02-13 MED ORDER — ACETAMINOPHEN 325 MG PO TABS
650.0000 mg | ORAL_TABLET | Freq: Four times a day (QID) | ORAL | Status: DC | PRN
  Administered 2022-02-13: 650 mg via ORAL
  Filled 2022-02-13: qty 2

## 2022-02-13 MED ORDER — OXYCODONE HCL 5 MG PO TABS
5.0000 mg | ORAL_TABLET | ORAL | Status: DC | PRN
  Administered 2022-02-14 (×3): 5 mg via ORAL
  Filled 2022-02-13 (×3): qty 1

## 2022-02-13 MED ORDER — CLOPIDOGREL BISULFATE 75 MG PO TABS
75.0000 mg | ORAL_TABLET | Freq: Every day | ORAL | Status: DC
  Administered 2022-02-13 – 2022-02-15 (×3): 75 mg via ORAL
  Filled 2022-02-13 (×3): qty 1

## 2022-02-13 MED ORDER — HYDROMORPHONE HCL 1 MG/ML IJ SOLN
0.5000 mg | INTRAMUSCULAR | Status: DC | PRN
  Administered 2022-02-14: 0.5 mg via INTRAVENOUS
  Filled 2022-02-13: qty 1

## 2022-02-13 MED ORDER — ACETAMINOPHEN 500 MG PO TABS
500.0000 mg | ORAL_TABLET | Freq: Once | ORAL | Status: AC
  Administered 2022-02-13: 500 mg via ORAL

## 2022-02-13 MED ORDER — SODIUM CHLORIDE 0.9 % IV BOLUS
1000.0000 mL | Freq: Once | INTRAVENOUS | Status: AC
  Administered 2022-02-13: 1000 mL via INTRAVENOUS

## 2022-02-13 MED ORDER — PROCHLORPERAZINE EDISYLATE 10 MG/2ML IJ SOLN
INTRAMUSCULAR | Status: AC
  Administered 2022-02-14: 10 mg via INTRAVENOUS
  Filled 2022-02-13: qty 2

## 2022-02-13 MED ORDER — PROCHLORPERAZINE EDISYLATE 10 MG/2ML IJ SOLN
10.0000 mg | Freq: Four times a day (QID) | INTRAMUSCULAR | Status: DC | PRN
Start: 2022-02-13 — End: 2022-02-14
  Administered 2022-02-13: 10 mg via INTRAVENOUS
  Filled 2022-02-13 (×2): qty 2

## 2022-02-13 MED ORDER — METOPROLOL SUCCINATE ER 25 MG PO TB24
12.5000 mg | ORAL_TABLET | Freq: Every day | ORAL | Status: DC
  Administered 2022-02-13 – 2022-02-15 (×3): 12.5 mg via ORAL
  Filled 2022-02-13: qty 0.5
  Filled 2022-02-13 (×2): qty 1

## 2022-02-13 MED ORDER — SODIUM CHLORIDE 0.9 % IV SOLN: INTRAVENOUS | Status: DC

## 2022-02-13 MED ORDER — ONDANSETRON HCL 4 MG/2ML IJ SOLN
4.0000 mg | Freq: Once | INTRAMUSCULAR | Status: AC
  Administered 2022-02-13: 4 mg via INTRAVENOUS
  Filled 2022-02-13: qty 2

## 2022-02-13 MED ORDER — ONDANSETRON HCL 4 MG/2ML IJ SOLN
4.0000 mg | Freq: Once | INTRAMUSCULAR | Status: AC
  Administered 2022-02-13: 4 mg via INTRAMUSCULAR

## 2022-02-13 MED ORDER — PANTOPRAZOLE SODIUM 40 MG IV SOLR: 40.0000 mg | Freq: Every day | INTRAVENOUS | Status: DC

## 2022-02-13 MED ORDER — INSULIN ASPART 100 UNIT/ML IJ SOLN: 0.0000 [IU] | Freq: Every day | INTRAMUSCULAR | Status: DC

## 2022-02-13 MED ORDER — ONDANSETRON 8 MG PO TBDP: 8.0000 mg | ORAL_TABLET | Freq: Three times a day (TID) | ORAL | 0 refills | Status: DC | PRN

## 2022-02-13 MED ORDER — MORPHINE SULFATE (PF) 4 MG/ML IV SOLN
4.0000 mg | Freq: Once | INTRAVENOUS | Status: AC
  Administered 2022-02-13: 4 mg via INTRAVENOUS
  Filled 2022-02-13: qty 1

## 2022-02-13 MED ORDER — MELATONIN 5 MG PO TABS
5.0000 mg | ORAL_TABLET | Freq: Every evening | ORAL | Status: DC | PRN
Start: 2022-02-13 — End: 2022-02-15
  Administered 2022-02-13: 5 mg via ORAL
  Filled 2022-02-13 (×2): qty 1

## 2022-02-13 MED ORDER — APIXABAN 2.5 MG PO TABS
2.5000 mg | ORAL_TABLET | Freq: Two times a day (BID) | ORAL | Status: DC
Start: 2022-02-13 — End: 2022-02-15
  Administered 2022-02-13 – 2022-02-15 (×4): 2.5 mg via ORAL
  Filled 2022-02-13 (×4): qty 1

## 2022-02-13 NOTE — ED Triage Notes (Signed)
Pt comes into the ED via EMS from home with c/o N/V and headache since last night, was seen at [facility] urgent care this morning and given nausea meds, recently dx with ESRD.  163/67 96%RA [id number]

## 2022-02-13 NOTE — Progress Notes (Signed)
PHARMACIST - PHYSICIAN COMMUNICATION  CONCERNING:  Enoxaparin (Lovenox) for DVT Prophylaxis    RECOMMENDATION: Patient was prescribed enoxaparin '40mg'$  q24 hours for VTE prophylaxis.   Filed Weights   02/13/22 1435  Weight: 57.2 kg (126 lb)    Body mass index is 23.81 kg/m.  Estimated Creatinine Clearance: 21.4 mL/min (A) (by C-G formula based on SCr of 1.29 mg/dL (H)).  Patient is candidate for enoxaparin '30mg'$  every 24 hours based on CrCl <51m/min or Weight <45kg  DESCRIPTION: [facility]  Patient is now receiving enoxaparin 30 mg every 24 hours   ABenita Gutter5/30/2023 6:42 PM

## 2022-02-13 NOTE — ED Provider Notes (Signed)
[facility]    CSN: [id number] Arrival date & time: 02/13/22  0815      History   Chief Complaint Chief Complaint  Patient presents with   Back Pain   Nausea   Leg Pain    HPI Cheyenne [name] is a 88 y.o. female presenting with nausea without vomiting or diarrhea for 1 day.  History extensive, including peripheral vascular disease, CAD, CKD stage IV, hypertension, paroxysmal A-fib, long-term anticoagulation, status post angioplasty with stent, left-sided sciatica, diverticulitis.  Her last visit with cardiology was 01/04/2022, and her last visit with Ortho was about 2 weeks ago ([facility], Dr. Lanae Crumbly).  Her primary complaint today is nausea for the last 24 hours.  She has not vomited.  She is tolerating some fluids, but family member notes that she does not like water, and so has not been pushing fluids adequately.  She had 2 normal bowel movements 1 day ago, without black or tarry stool or bright red blood; she is still passing gas.  She denies any recent eating out, travel, or concern for food poisoning.  Last antibiotics were greater than 1 month ago per patient.  She is not having any associated symptoms including shortness of breath, chest pain, epigastric burning, left arm pain, left jaw pain.  The abdominal pain is diffuse and tolerable, states this does not feel like her typical diverticulitis flares.  Denies fevers, sore throat, cough, congestion.  She does incidentally mention her chronic left sciatica, she had an injection by Dr. Cherylynn Ridges about 2 weeks ago without relief.  She is followed closely by [facility] for this, she was unaware that they have a walk-in clinic.  She has filled 2 scripts of oxycodone 30 pills (60 pills total) this month, and caregiver states that she has some of this left at home.  The sciatica is unchanged, without any new lower extremity weakness, saddle anesthesia, or urinary symptoms like retention or incontinence.  She also denies dysuria,  hematuria, frequency, urgency.  History hysterectomy.  HPI  History reviewed. No pertinent past medical history.  There are no problems to display for this patient.   Past Surgical History:  Procedure Laterality Date   CARDIAC SURGERY  08/17/2021   3 stints and a Balloon.    OB History   No obstetric history on file.      Home Medications    Prior to Admission medications   Medication Sig Start Date End Date Taking? Authorizing Provider  Calcium Carbonate-Vitamin D 600-5 MG-MCG TABS Take by mouth. 05/20/20  Yes [provider]  cetirizine (ZYRTEC) 10 MG tablet Take by mouth. 07/13/21 07/13/22 Yes [provider]  clopidogrel (PLAVIX) 75 MG tablet Take 75 mg by mouth daily. 01/16/22  Yes [provider]  cyanocobalamin 1000 MCG tablet TAKE 1 TABLET EVERY DAY FOR VITAMIN B12 DEFICIENCY 11/16/21  Yes [provider]  ELIQUIS 2.5 MG TABS tablet Take 2.5 mg by mouth 2 (two) times daily. 01/29/22  Yes [provider]  GOODSENSE ASPIRIN LOW DOSE 81 MG tablet Take 81 mg by mouth daily. 09/27/21  Yes [provider]  HYDROcodone-acetaminophen (NORCO/VICODIN) 5-325 MG tablet Take 1 tablet by mouth every 4 (four) hours as needed. 11/08/21  Yes [provider]  JARDIANCE 10 MG TABS tablet Take 10 mg by mouth daily. 01/16/22  Yes [provider]  lisinopril (ZESTRIL) 2.5 MG tablet Take 2.5 mg by mouth daily. 01/16/22  Yes [provider]  metoprolol succinate (TOPROL-XL) 25 MG 24  hr tablet Take 25 mg by mouth daily. 01/16/22  Yes [provider]  Multiple Vitamin (MULTI-VITAMIN) tablet Take 1 tablet by mouth daily.   Yes [provider]  nitroGLYCERIN (NITROSTAT) 0.4 MG SL tablet  01/25/22  Yes [provider]  omeprazole (PRILOSEC) 40 MG capsule Take 40 mg by mouth daily. 12/25/21  Yes [provider]  ondansetron (ZOFRAN-ODT) 8 MG disintegrating tablet Take 1 tablet (8 mg total) by mouth  every 8 (eight) hours as needed for nausea or vomiting. 02/13/22  Yes [name], PA-C  pregabalin (LYRICA) 150 MG capsule Take 150 mg by mouth 2 (two) times daily. 01/29/22  Yes [provider]  torsemide (DEMADEX) 20 MG tablet Take 20 mg by mouth daily. 01/29/22  Yes [provider]  traMADol (ULTRAM) 50 MG tablet Take 50 mg by mouth 3 (three) times daily as needed. 10/19/21  Yes [provider]    Family History History reviewed. No pertinent family history.  Social History Social History   Tobacco Use   Smoking status: Never   Smokeless tobacco: Never  Vaping Use   Vaping Use: Never used  Substance Use Topics   Alcohol use: Never   Drug use: Never     Allergies   Patient has no allergy information on record.   Review of Systems Review of Systems  Constitutional:  Negative for appetite change, chills, diaphoresis, fever and unexpected weight change.  HENT:  Negative for congestion, ear pain, sinus pressure, sinus pain, sneezing, sore throat and trouble swallowing.   Respiratory:  Negative for cough, chest tightness and shortness of breath.   Cardiovascular:  Negative for chest pain.  Gastrointestinal:  Positive for abdominal pain, nausea and vomiting. Negative for abdominal distention, anal bleeding, blood in stool, constipation, diarrhea and rectal pain.  Genitourinary:  Negative for dysuria, flank pain, frequency and urgency.  Musculoskeletal:  Negative for back pain and myalgias.  Neurological:  Negative for dizziness, light-headedness and headaches.    Physical Exam Triage Vital Signs ED Triage Vitals  Enc Vitals Group     BP 02/13/22 0842 (!) 161/85     Pulse Rate 02/13/22 0842 (!) 53     Resp 02/13/22 0842 18     Temp 02/13/22 0842 98.2 F (36.8 C)     Temp Source 02/13/22 0842 Oral     SpO2 02/13/22 0842 97 %     Weight 02/13/22 0835 126 lb (57.2 kg)     Height 02/13/22 0835 '5\' 1"'$  (1.549 m)     Head Circumference --      Peak  Flow --      Pain Score 02/13/22 0835 9     Pain Loc --      Pain Edu? --      Excl. in [place]? --    Orthostatic VS for the past 24 hrs:  BP- Lying Pulse- Lying BP- Sitting Pulse- Sitting BP- Standing at 0 minutes Pulse- Standing at 0 minutes  02/13/22 0845 (!) 178/100 81 (!) 129/94 64 167/89 95    Updated Vital Signs BP (!) 161/85 (BP Location: Left Arm)   Pulse (!) 53   Temp 98.2 F (36.8 C) (Oral)   Resp 18   Ht '5\' 1"'$  (1.549 m)   Wt 126 lb (57.2 kg)   SpO2 97%   BMI 23.81 kg/m   Visual Acuity Right Eye Distance:   Left Eye Distance:   Bilateral Distance:    Right Eye Near:   Left  Eye Near:    Bilateral Near:     Physical Exam Vitals reviewed.  Constitutional:      General: She is not in acute distress.    Appearance: Normal appearance. She is not ill-appearing.  HENT:     Head: Normocephalic and atraumatic.     Mouth/Throat:     Mouth: Mucous membranes are moist.     Comments: Moist mucous membranes Eyes:     Extraocular Movements: Extraocular movements intact.     Pupils: Pupils are equal, round, and reactive to light.  Cardiovascular:     Rate and Rhythm: Normal rate and regular rhythm.     Heart sounds: Normal heart sounds.  Pulmonary:     Effort: Pulmonary effort is normal.     Breath sounds: Normal breath sounds. No wheezing, rhonchi or rales.  Abdominal:     General: Bowel sounds are increased. There is no distension.     Palpations: Abdomen is soft. There is no mass.     Tenderness: There is generalized abdominal tenderness. There is no right CVA tenderness, left CVA tenderness, guarding or rebound. Negative signs include Murphy's sign, Rovsing's sign and McBurney's sign.     Comments: Generalized TTP without focal component. Comfortable throughout exam. No guarding, rebound.   Musculoskeletal:     Comments: Exam limited due to wheelchair. There is L sciatic pain at rest, worse with flexion L hip. Strength is 4/5 bilateral Les, no saddle anesthesia. No  midline spinous tenderness, deformity, stepoff.   Skin:    General: Skin is warm.     Capillary Refill: Capillary refill takes less than 2 seconds.  Neurological:     General: No focal deficit present.     Mental Status: She is alert and oriented to person, place, and time.  Psychiatric:        Mood and Affect: Mood normal.        Behavior: Behavior normal.     UC Treatments / Results  Labs (all labs ordered are listed, but only abnormal results are displayed) Labs Reviewed  URINALYSIS, ROUTINE W REFLEX MICROSCOPIC - Abnormal; Notable for the following components:      Result Value   Glucose, UA 500 (*)    Hgb urine dipstick TRACE (*)    Ketones, ur TRACE (*)    All other components within normal limits  URINALYSIS, MICROSCOPIC (REFLEX) - Abnormal; Notable for the following components:   Bacteria, UA RARE (*)    All other components within normal limits  URINE CULTURE    EKG   Radiology No results found.  Procedures Procedures (including critical care time)  Medications Ordered in UC Medications  acetaminophen (TYLENOL) tablet 500 mg (500 mg Oral Given 02/13/22 0917)  ondansetron (ZOFRAN) injection 4 mg (4 mg Intramuscular Given 02/13/22 0929)    Initial Impression / Assessment and Plan / UC Course  I have reviewed the triage vital signs and the nursing notes.  Pertinent labs & imaging results that were available during my care of the patient were reviewed by me and considered in my medical decision making (see chart for details).     This patient is a very pleasant 88 y.o. year old female presenting with nausea without vomiting or diarrhea x1 day. Afebrile, nontachy. Appears reasonably well hydrated. Suspect viral gastroenteriits; no recent travel, eating out, or abx.  She has not vomited at all. Two normal BMs one day ago. Never any black and tarry stool or BRBPR. Abdominal pain is generalized and  mild, without guarding or rebound. Low suspicion for acute abdomen,  SBO, diverticulitis, c dif, colitis. UA is wnl, and she is not having urinary symptoms. Will check a culture. There is no SOB, CP, dizziness, weakness, focal weakness; low concern for ACS. As she has not vomited and is tolerating fluids by mouth, I do not believe IV fluids are warranted at this point; pt and caregiver in agreement. IM zofran administerd during visit, and Zofran ODT sent. Good hydration, BRAT diet. Strict return precautions. Her last labs were 02/08/22. As she has not vomited or had diarrhea, I doubt there is a new electolyte derangement today, so we did not check additional labwork today. F/u with PCP if symptoms worsen/persist, or new symptoms.   For the chronic sciatica - she is followed by Dr. L[facility] at [facility]; I do not have access to these records. Per pt she had a steroid injection 2 weeks ago for this without relief. The pain is typical for her, without new symptoms like weakness or saddle anesthesia. PDMP 550; she has filled two scripts of oxycodone this month (60 pills total) and has some left at home which she can take for the pain. She was unaware that [facility] a walk in clinic, so she can follow-up with them is pain persists despite oxycodone.   ED return precautions discussed. Patient and caregiver verbalizes understanding and agreement.    Final Clinical Impressions(s) / UC Diagnoses   Final diagnoses:  Viral gastroenteritis  Left sided sciatica  Nausea without vomiting     Discharge Instructions      -Take the Zofran (ondansetron) up to 3 times daily for nausea and vomiting. Dissolve one pill under your tongue or between your teeth and your cheek. -Drink plenty of  fluids - water, gatorade. Eat bland foods as tolerated. As long as you keep the fluids down, oral hydration is just as good as IV fluids -If your abdominal pain worsens, you develop new symptoms like fevers, you begin vomiting and cannot keep any fluids down-head to the emergency department for  further interventions. -Continue oxycodone as directed for the chronic sciatica, you should follow-up with [facility] for further management of this.    ED Prescriptions     Medication Sig Dispense Auth. Provider   ondansetron (ZOFRAN-ODT) 8 MG disintegrating tablet Take 1 tablet (8 mg total) by mouth every 8 (eight) hours as needed for nausea or vomiting. 20 tablet [name], PA-C      I have reviewed the PDMP during this encounter.   [name], PA-C 02/13/22 [phone]

## 2022-02-13 NOTE — ED Provider Notes (Signed)
[facility] Provider Note    Event Date/Time   First MD Initiated Contact with Patient 02/13/22 1840     (approximate)   History   Nausea and Back Pain   HPI  KIANNAH GRUNOW is a 88 y.o. female who comes in from urgent care complaining of a flareup of her left-sided sciatica and a lot of nausea.  She has not had any vomiting or diarrhea today.  She does not have any belly pain currently.  Her sciatica is mostly in the left back worse with palpation of the left SI joint but also going down into the leg some.  This sciatica is a chronic chronic problem for her.  This flareup is in line with her usual flareups.      Physical Exam   Triage Vital Signs: ED Triage Vitals  Enc Vitals Group     BP 02/13/22 1434 (!) 177/75     Pulse Rate 02/13/22 1434 78     Resp 02/13/22 1434 18     Temp 02/13/22 1434 97.6 F (36.4 C)     Temp Source 02/13/22 1434 Oral     SpO2 02/13/22 1434 96 %     Weight 02/13/22 1435 126 lb (57.2 kg)     Height 02/13/22 1435 '5\' 1"'$  (1.549 m)     Head Circumference --      Peak Flow --      Pain Score 02/13/22 1435 8     Pain Loc --      Pain Edu? --      Excl. in [place]? --     Most recent vital signs: Vitals:   02/13/22 2300 02/13/22 2321  BP: (!) 179/66 (!) 160/51  Pulse:    Resp: 16 15  Temp: 97.9 F (36.6 C)   SpO2: 97% 95%     General: Awake, uncomfortable due to her back CV:  Good peripheral perfusion.  Heart regular rate and rhythm no audible murmurs Resp:  Normal effort.  Lungs are clear Abd:  No distention.  Soft nontender Low back pain especially on the left side in the area of the SI joint and down into the buttocks on the left.   ED Results / Procedures / Treatments   Labs (all labs ordered are listed, but only abnormal results are displayed) Labs Reviewed  COMPREHENSIVE METABOLIC PANEL - Abnormal; Notable for the following components:      Result Value   Sodium 125 (*)    Chloride 92 (*)    CO2 20  (*)    Glucose, Bld 102 (*)    BUN 31 (*)    Creatinine, Ser 1.29 (*)    GFR, Estimated 39 (*)    All other components within normal limits  SODIUM - Abnormal; Notable for the following components:   Sodium 125 (*)    All other components within normal limits  OSMOLALITY - Abnormal; Notable for the following components:   Osmolality 270 (*)    All other components within normal limits  TROPONIN I (HIGH SENSITIVITY) - Abnormal; Notable for the following components:   Troponin I (High Sensitivity) 24 (*)    All other components within normal limits  TROPONIN I (HIGH SENSITIVITY) - Abnormal; Notable for the following components:   Troponin I (High Sensitivity) 27 (*)    All other components within normal limits  LIPASE, BLOOD  CBC  APTT  SODIUM, URINE, RANDOM  OSMOLALITY, URINE  CREATININE, URINE, RANDOM  HEMOGLOBIN A1C  SODIUM  COMPREHENSIVE METABOLIC PANEL  CBC  MAGNESIUM  PHOSPHORUS  TSH  CBG MONITORING, ED     EKG  EKG read and interpreted by me shows normal sinus rhythm rate of 75 left axis left bundle branch block no EKGs to compare to since 2010.  Patient is not having any chest pain.   RADIOLOGY Chest x-ray done to evaluate possible lung disease mass due to low sodium.  Possible SIADH.  Chest x-ray read by radiology films reviewed by me and evaluated by me are negative.  Patient does have a big heart.   PROCEDURES:  Critical Care performed:   Procedures   MEDICATIONS ORDERED IN ED: Medications  insulin aspart (novoLOG) injection 0-9 Units (has no administration in time range)  insulin aspart (novoLOG) injection 0-5 Units (0 Units Subcutaneous Not Given 02/13/22 2135)  clopidogrel (PLAVIX) tablet 75 mg (75 mg Oral Given 02/13/22 2006)  apixaban (ELIQUIS) tablet 2.5 mg (2.5 mg Oral Given 02/13/22 2118)  metoprolol succinate (TOPROL-XL) 24 hr tablet 12.5 mg (12.5 mg Oral Given 02/13/22 2006)  0.9 %  sodium chloride infusion ( Intravenous New Bag/Given 02/13/22  2058)  prochlorperazine (COMPAZINE) injection 10 mg (10 mg Intravenous Given 02/13/22 1926)  acetaminophen (TYLENOL) tablet 650 mg (650 mg Oral Given 02/13/22 2258)  melatonin tablet 5 mg (5 mg Oral Given 02/13/22 2258)  HYDROmorphone (DILAUDID) injection 0.5 mg (has no administration in time range)  oxyCODONE (Oxy IR/ROXICODONE) immediate release tablet 5 mg (has no administration in time range)  ondansetron (ZOFRAN) injection 4 mg (4 mg Intravenous Given 02/13/22 1602)  morphine (PF) 4 MG/ML injection 4 mg (4 mg Intravenous Given 02/13/22 1735)  sodium chloride 0.9 % bolus 1,000 mL (1,000 mLs Intravenous New Bag/Given 02/13/22 1743)     IMPRESSION / MDM / ASSESSMENT AND PLAN / ED COURSE  I reviewed the triage vital signs and the nursing notes. Patient with marked hyponatremia.  We will have to get her in the hospital get this stabilized to find out the cause of it.  Patient does not appear to have had this before.  Possible that she has has a set [facility].  She is not having any vomiting or diarrhea and as I mentioned does not appear to have had low sodium previously.    Patient's presentation is most consistent with acute presentation with potential threat to life or bodily function.  The patient is on the cardiac monitor to evaluate for evidence of arrhythmia and/or significant heart rate changes.  None were seen      FINAL CLINICAL IMPRESSION(S) / ED DIAGNOSES   Final diagnoses:  Nausea  Hyponatremia  Chronic left-sided low back pain with left-sided sciatica     Rx / DC Orders   ED Discharge Orders     None        Note:  This document was prepared using Dragon voice recognition software and may include unintentional dictation errors.   Nena Polio, MD 02/13/22 [phone]

## 2022-02-13 NOTE — H&P (Addendum)
History and Physical  Cheyenne [name]:[id number] DOB: August 12, 1928 DOA: 02/13/2022  Referring physician: Dr. Cinda Quest, [place]  PCP: [place]  Outpatient Specialists: Cardiology, neurology. Patient coming from: Home, lives alone.  Chief Complaint: Nausea and vomiting.  HPI: Cheyenne [name] is a 88 y.o. female with medical history significant for paroxysmal A-fib on Eliquis, peripheral vascular disease/coronary artery disease status post PCI with stent placement 3 months ago, essential hypertension, CKD 3B, chronic sciatica pain on chronic opioids, who presented to [facility] ED with complaints of persistent nausea and vomiting x1 today.  Associated with generalized weakness and fatigue.  No reported falls.  Nausea has been ongoing for the past week.  Not constipated.  No chest pain.  No reported fevers.  Feels cold most of the time.  She saw her PCP about a week ago for the same who advised her to come to the ED for hydration due to abnormal labs, but the patient did not.  Worsening sciatica pain, her oxycodone dose was increased to 10 mg daily from 5 mg daily.  Has received steroids injections in the past and has an upcoming appointment with her orthopedic surgeon for additional steroids injections.    She felt very weak yesterday, which persisted so she went to urgent care today where she was told she was dehydrated and sent to the ED.  She presented to the ED.  Workup in the ED revealed hypovolemic hyponatremia with serum sodium of 125, elevated creatinine 1.29.  Elevated troponin 24, 27.  Urinalysis negative for pyuria.  CBC unremarkable.  Afebrile.  Received 1 L IV fluid normal saline bolus, IV morphine 4 mg x 1 and IV Zofran 4 mg x 1 in the ED.  EDP requested admission due to hyponatremia, intractable nausea/vomiting and elevated troponin.  The patient was admitted by hospitalist service, [facility].  ED Course: Tmax 98.2.  BP 132/89, pulse 60, respiratory 20, saturation 97% on room air.  Lab  studies remarkable for serum sodium 125, chloride 92, serum bicarb 20, serum glucose 102, BUN 31, creatinine 1.29, GFR 39.  Troponin 24.  Review of Systems: Review of systems as noted in the HPI. All other systems reviewed and are negative.   No past medical history on file. Past Surgical History:  Procedure Laterality Date   CARDIAC SURGERY  08/17/2021   3 stints and a Balloon.    Social History:  reports that she has never smoked. She has never used smokeless tobacco. She reports that she does not drink alcohol and does not use drugs.   No Known Allergies  Family history: Lung cancer and heart disease in brother. Heart disease in father.  Prior to Admission medications   Medication Sig Start Date End Date Taking? Authorizing Provider  Calcium Carbonate-Vitamin D 600-5 MG-MCG TABS Take by mouth. 05/20/20  Yes [provider]  cetirizine (ZYRTEC) 10 MG tablet Take by mouth. 07/13/21 07/13/22 Yes [provider]  clopidogrel (PLAVIX) 75 MG tablet Take 75 mg by mouth daily. 01/16/22  Yes [provider]  cyanocobalamin 1000 MCG tablet TAKE 1 TABLET EVERY DAY FOR VITAMIN B12 DEFICIENCY 11/16/21  Yes [provider]  ELIQUIS 2.5 MG TABS tablet Take 2.5 mg by mouth 2 (two) times daily. 01/29/22  Yes [provider]  HYDROcodone-acetaminophen (NORCO/VICODIN) 5-325 MG tablet Take 1 tablet by mouth every 4 (four) hours as needed. 11/08/21  Yes [provider]  JARDIANCE 10 MG TABS tablet Take 10 mg by mouth daily. 01/16/22  Yes  [provider]  lisinopril (ZESTRIL) 2.5 MG tablet Take 2.5 mg by mouth daily. 01/16/22  Yes [provider]  metoprolol succinate (TOPROL-XL) 25 MG 24 hr tablet Take 25 mg by mouth daily. 01/16/22  Yes [provider]  Multiple Vitamin (MULTI-VITAMIN) tablet Take 1 tablet by mouth daily.   Yes [provider]  omeprazole (PRILOSEC) 40 MG capsule Take 40 mg by mouth daily. 12/25/21  Yes  [provider]  pregabalin (LYRICA) 150 MG capsule Take 150 mg by mouth 2 (two) times daily. 01/29/22  Yes [provider]  torsemide (DEMADEX) 20 MG tablet Take 20 mg by mouth daily. 01/29/22  Yes [provider]  ENTRESTO 24-26 MG Take by mouth. Patient not taking: Reported on 02/13/2022 09/15/21   [provider]  etodolac (LODINE) 400 MG tablet Take 400 mg by mouth daily. Patient not taking: Reported on 02/13/2022 10/12/21   [provider]  [place] 81 MG tablet Take 81 mg by mouth daily. Patient not taking: Reported on 02/13/2022 09/27/21   [provider]  meloxicam (MOBIC) 15 MG tablet Take 15 mg by mouth daily. Patient not taking: Reported on 02/13/2022 08/22/21   [provider]  naloxone [facility]) nasal spray 4 mg/0.1 mL SMARTSIG:Both Nares 02/08/22   [provider]  nitroGLYCERIN (NITROSTAT) 0.4 MG SL tablet  01/25/22   [provider]  ondansetron (ZOFRAN-ODT) 8 MG disintegrating tablet Take 1 tablet (8 mg total) by mouth every 8 (eight) hours as needed for nausea or vomiting. Patient not taking: Reported on 02/13/2022 02/13/22   [name], PA-C  Oxycodone HCl 10 MG TABS Take by mouth. Patient not taking: Reported on 02/13/2022 01/29/22   [provider]  pregabalin (LYRICA) 75 MG capsule Take 75 mg by mouth 2 (two) times daily. Patient not taking: Reported on 02/13/2022 08/30/21   [provider]  spironolactone (ALDACTONE) 25 MG tablet Take 12.5 mg by mouth daily. Patient not taking: Reported on 02/13/2022 10/11/21   [provider]  tiZANidine (ZANAFLEX) 2 MG tablet Take 2 mg by mouth at bedtime as needed. Patient not taking: Reported on 02/13/2022 08/19/21   [provider]  traMADol (ULTRAM) 50 MG tablet Take 50 mg by mouth 3 (three) times daily as needed. Patient not taking: Reported on 02/13/2022 10/19/21   [provider]    Physical Exam: BP  (!) 159/76   Pulse 64   Temp 97.6 F (36.4 C) (Oral)   Resp 16   Ht '5\' 1"'$  (1.549 m)   Wt 57.2 kg   SpO2 97%   BMI 23.81 kg/m   General: 88 y.o. year-old female Weak and frail appearing.  Alert and oriented x3. Cardiovascular: Regular rate and rhythm with no rubs or gallops.  No thyromegaly or JVD noted.  No lower extremity edema. 2/4 pulses in all 4 extremities. Respiratory: Clear to auscultation with no wheezes or rales. Good inspiratory effort. Abdomen: Soft nontender nondistended with normal bowel sounds x4 quadrants. Muskuloskeletal: No cyanosis, clubbing or edema noted bilaterally Neuro: CN II-XII intact, strength, sensation, reflexes Skin: No ulcerative lesions noted or rashes Psychiatry: Judgement and insight appear normal. Mood is appropriate for condition and setting          Labs on Admission:  Basic Metabolic Panel: Recent Labs  Lab 02/13/22 1445  NA 125*  K 4.4  CL 92*  CO2 20*  GLUCOSE 102*  BUN 31*  CREATININE 1.29*  CALCIUM 9.4  Liver Function Tests: Recent Labs  Lab 02/13/22 1445  AST 24  ALT 15  ALKPHOS 45  BILITOT 1.1  PROT 7.6  ALBUMIN 4.1   Recent Labs  Lab 02/13/22 1445  LIPASE 46   No results for input(s): AMMONIA in the last 168 hours. CBC: Recent Labs  Lab 02/13/22 1445  WBC 8.3  HGB 13.8  HCT 39.6  MCV 92.5  PLT 259   Cardiac Enzymes: No results for input(s): CKTOTAL, CKMB, CKMBINDEX, TROPONINI in the last 168 hours.  BNP (last 3 results) Recent Labs    08/24/21 1251  BNP 818.4*    ProBNP (last 3 results) No results for input(s): PROBNP in the last 8760 hours.  CBG: No results for input(s): GLUCAP in the last 168 hours.  Radiological Exams on Admission: DG Chest 2 View  Result Date: 02/13/2022 CLINICAL DATA:  Weakness, hyponatremia EXAM: CHEST - 2 VIEW COMPARISON:  11/02/2013 FINDINGS: Lungs are clear.  No pleural effusion or pneumothorax. The heart is normal in size. Stable moderate wedging of a lower  thoracic/upper lumbar vertebral body, unchanged. Surgical clips along the right chest wall/axilla. IMPRESSION: Normal chest radiographs. Electronically Signed   By: Julian Hy M.D.   On: 02/13/2022 18:12    EKG: I independently viewed the EKG done and my findings are as followed: Sinus rhythm rate of 75.  Nonspecific ST-T changes.  LBBB.  QTc 498.  Assessment/Plan Present on Admission:  Hyponatremia  Principal Problem:   Hyponatremia  Hypovolemic hyponatremia Nausea and vomiting x1 today. Denies dizziness Presented with serum sodium 125 Received 1 L IV fluid bolus normal saline in the ED Serum sodium every 4 hours, avoid quick correction no more than 8 mEq in a 24-hour period. Obtain urine lites,urine osmolality, urine sodium, ur creatinine, serum osmolality. Obtain TSH IV antiemetics as needed Gentle IV fluid hydration normal saline at 75 cc/h x 1 day.  Intractable nausea and vomiting Possibly from viral gastroenteritis IV antiemetics as needed Gentle IV fluid hydration  Clear liquid diet, advance as tolerated  Elevated troponin/Recent PCI with stent placement Per her son at bedside she had PCI with stent placement 3 months ago Troponin 24, 27 No anginal symptoms-unclear how long she had LBBB Obtain 2D echo, ordered Resume home cardiac medications Monitor on telemetry  Paroxysmal A-fib on Eliquis Resume home Eliquis for CVA prevention Rate controlled  Non anion gap metabolic acidosis Serum bicarb 20, anion gap 13 Gentle IV fluid hydration  Prolonged QTc 12 lead EKG with QTc 498 Avoid QTc prolonging agents. Optimize magnesium and potassium levels Repeat 12 lead EKG in the morning  CKD 3B Appears to be at her baseline creatinine 1.29 with GFR of 33. Avoid nephrotoxic agents, dehydration and hypotension. Monitor urine output with strict I's and O's  Chronic sciatica pain Analgesics as needed On chronic opioids Bowel regimen to avoid  constipation    Critical care time:  65 minutes     DVT prophylaxis: Eliquis  Code Status: Full code  Family Communication: Son and daughter in law at bedside  Disposition Plan: Admitted to progressive unit.  Consults called: None.  Admission status: Inpatient status.   Status is: Inpatient Patient requires at least 2 midnights for further evaluation and treatment of present condition.   Kayleen Memos MD [facility] Pager [phone]  If 7PM-7AM, please contact night-coverage [email or web address] Password [email or web address]  02/13/2022, 6:59 PM

## 2022-02-13 NOTE — ED Triage Notes (Signed)
Pt here with nausea and a headache since last night. Pt also c/o back pain from a sciatic nerve in her back that has been giving her a lot of issues. Pt denies abd pain.

## 2022-02-13 NOTE — Discharge Instructions (Addendum)
-  Take the Zofran (ondansetron) up to 3 times daily for nausea and vomiting. Dissolve one pill under your tongue or between your teeth and your cheek. -Drink plenty of  fluids - water, gatorade. Eat bland foods as tolerated. As long as you keep the fluids down, oral hydration is just as good as IV fluids -If your abdominal pain worsens, you develop new symptoms like fevers, you begin vomiting and cannot keep any fluids down-head to the emergency department for further interventions. -Continue oxycodone as directed for the chronic sciatica, you should follow-up with [facility] for further management of this.

## 2022-02-13 NOTE — ED Triage Notes (Signed)
Pt c/o nausea. Pt believes that she is dehydrated.   Pt states that she has a pinched left sciatic nerve and went through Heart surgery with [facility] and has not been drinking any water due to pain.   Pt did not vomit today and states that her nausea has gotten better from this morning.

## 2022-02-14 ENCOUNTER — Encounter: Payer: Self-pay | Admitting: Internal Medicine

## 2022-02-14 ENCOUNTER — Inpatient Hospital Stay
Admit: 2022-02-14 | Discharge: 2022-02-14 | Disposition: A | Discharge status: Home / Self Care | Payer: Medicare HMO | Attending: Internal Medicine | Admitting: Internal Medicine

## 2022-02-14 ENCOUNTER — Inpatient Hospital Stay: Payer: Medicare HMO

## 2022-02-14 DIAGNOSIS — N1832 Chronic kidney disease, stage 3b: Secondary | ICD-10-CM

## 2022-02-14 DIAGNOSIS — R112 Nausea with vomiting, unspecified: Secondary | ICD-10-CM

## 2022-02-14 DIAGNOSIS — I480 Paroxysmal atrial fibrillation: Secondary | ICD-10-CM

## 2022-02-14 DIAGNOSIS — E872 Acidosis, unspecified: Secondary | ICD-10-CM | POA: Diagnosis not present

## 2022-02-14 DIAGNOSIS — R778 Other specified abnormalities of plasma proteins: Secondary | ICD-10-CM

## 2022-02-14 DIAGNOSIS — E871 Hypo-osmolality and hyponatremia: Secondary | ICD-10-CM | POA: Diagnosis not present

## 2022-02-14 DIAGNOSIS — M5430 Sciatica, unspecified side: Secondary | ICD-10-CM

## 2022-02-14 DIAGNOSIS — R9431 Abnormal electrocardiogram [ECG] [EKG]: Secondary | ICD-10-CM

## 2022-02-14 LAB — URINE CULTURE: Culture: <10000 — AB

## 2022-02-14 LAB — CBC
HCT: 33 % — ABNORMAL LOW (ref 36.0–46.0)
Hemoglobin: 11.9 g/dL — ABNORMAL LOW (ref 12.0–15.0)
MCH: 33.1 pg (ref 26.0–34.0)
MCHC: 36.1 g/dL — ABNORMAL HIGH (ref 30.0–36.0)
MCV: 91.9 fL (ref 80.0–100.0)
Platelets: 178 10*3/uL (ref 150–400)
RBC: 3.59 MIL/uL — ABNORMAL LOW (ref 3.87–5.11)
RDW: 12.1 % (ref 11.5–15.5)
WBC: 6.9 10*3/uL (ref 4.0–10.5)
nRBC: 0 % (ref 0.0–0.2)

## 2022-02-14 LAB — TSH: TSH: 4.672 u[IU]/mL — ABNORMAL HIGH (ref 0.350–4.500)

## 2022-02-14 LAB — GLUCOSE, CAPILLARY
Glucose-Capillary: 80 mg/dL (ref 70–99)
Glucose-Capillary: 110 mg/dL — ABNORMAL HIGH (ref 70–99)
Glucose-Capillary: 133 mg/dL — ABNORMAL HIGH (ref 70–99)
Glucose-Capillary: 111 mg/dL — ABNORMAL HIGH (ref 70–99)

## 2022-02-14 LAB — PHOSPHORUS: Phosphorus: 3.9 mg/dL (ref 2.5–4.6)

## 2022-02-14 LAB — COMPREHENSIVE METABOLIC PANEL
ALT: 13 U/L (ref 0–44)
AST: 21 U/L (ref 15–41)
Albumin: 3.1 g/dL — ABNORMAL LOW (ref 3.5–5.0)
Alkaline Phosphatase: 32 U/L — ABNORMAL LOW (ref 38–126)
Anion gap: 8 (ref 5–15)
BUN: 27 mg/dL — ABNORMAL HIGH (ref 8–23)
CO2: 18 mmol/L — ABNORMAL LOW (ref 22–32)
Calcium: 8.4 mg/dL — ABNORMAL LOW (ref 8.9–10.3)
Chloride: 97 mmol/L — ABNORMAL LOW (ref 98–111)
Creatinine, Ser: 1.03 mg/dL — ABNORMAL HIGH (ref 0.44–1.00)
GFR, Estimated: 51 mL/min — ABNORMAL LOW (ref >60)
Glucose, Bld: 81 mg/dL (ref 70–99)
Potassium: 4.4 mmol/L (ref 3.5–5.1)
Sodium: 123 mmol/L — ABNORMAL LOW (ref 135–145)
Total Bilirubin: 0.9 mg/dL (ref 0.3–1.2)
Total Protein: 5.8 g/dL — ABNORMAL LOW (ref 6.5–8.1)

## 2022-02-14 LAB — SODIUM: Sodium: 124 mmol/L — ABNORMAL LOW (ref 135–145)

## 2022-02-14 LAB — MAGNESIUM: Magnesium: 1.8 mg/dL (ref 1.7–2.4)

## 2022-02-14 LAB — HEMOGLOBIN A1C
Hgb A1c MFr Bld: 5.9 % — ABNORMAL HIGH (ref 4.8–5.6)
Mean Plasma Glucose: 122.63 mg/dL

## 2022-02-14 MED ORDER — OXYCODONE-ACETAMINOPHEN 5-325 MG PO TABS
1.0000 | ORAL_TABLET | ORAL | Status: DC | PRN
  Administered 2022-02-14: 2 via ORAL
  Filled 2022-02-14: qty 2

## 2022-02-14 MED ORDER — SODIUM CHLORIDE 1 G PO TABS
1.0000 g | ORAL_TABLET | Freq: Two times a day (BID) | ORAL | Status: DC
  Administered 2022-02-14 (×2): 1 g via ORAL
  Filled 2022-02-14 (×2): qty 1

## 2022-02-14 MED ORDER — POLYVINYL ALCOHOL 1.4 % OP SOLN
1.0000 [drp] | OPHTHALMIC | Status: DC | PRN
  Administered 2022-02-14: 1 [drp] via OPHTHALMIC
  Filled 2022-02-14: qty 15

## 2022-02-14 MED ORDER — SODIUM BICARBONATE 650 MG PO TABS
650.0000 mg | ORAL_TABLET | Freq: Three times a day (TID) | ORAL | Status: DC
  Administered 2022-02-14: 650 mg via ORAL
  Filled 2022-02-14: qty 1

## 2022-02-14 MED ORDER — PROCHLORPERAZINE EDISYLATE 10 MG/2ML IJ SOLN
10.0000 mg | INTRAMUSCULAR | Status: DC | PRN
  Administered 2022-02-14 (×2): 10 mg via INTRAVENOUS
  Filled 2022-02-14 (×2): qty 2

## 2022-02-14 MED ORDER — SODIUM BICARBONATE 650 MG PO TABS
1300.0000 mg | ORAL_TABLET | Freq: Three times a day (TID) | ORAL | Status: DC
  Administered 2022-02-14 – 2022-02-15 (×3): 1300 mg via ORAL
  Filled 2022-02-14 (×3): qty 2

## 2022-02-14 MED ORDER — ONDANSETRON HCL 4 MG/2ML IJ SOLN: 4.0000 mg | Freq: Four times a day (QID) | INTRAMUSCULAR | Status: DC | PRN

## 2022-02-14 NOTE — Plan of Care (Signed)

## 2022-02-14 NOTE — Progress Notes (Signed)
[facility] Liaison Note  Notified by Oneita Kras of patient/family request of [facility] Paliative services.  [facility] hospital liaison will follow patient for discharge disposition.   Please call with any questions/concerns.    Thank you for the opportunity to participate in this patient's care.   Daphene Calamity, MSW [facility] Liaison  [phone]

## 2022-02-14 NOTE — TOC Initial Note (Signed)
Transition of Care [facility]) - Initial/Assessment Note    Patient Details  Name: Cheyenne [name] MRN: [id number] Date of Birth: 1927-12-18  [facility] of [facility] CM/SW Contact:    Donnelly Angelica, LCSW Phone Number: 02/14/2022, 2:06 PM  Clinical Narrative:                 CSW met with pt and daughter in law at the bedside. CSW introduced role and explained therapy recommendation will be discussed. Pt is agreeable to [facility]. First preference is [facility]. Referral accepted for PT and OT. No DME recommendations at this time. Daughter-In-Law requested outpatient palliative referral. Referral made to Daphene Calamity with [facility]. No further concerns. CSW encouraged pt and Daughter-In-Law to contact CSW as needed. CSW will continue to follow pt and Daughter-In-Law for support and facilitate return home once stable.   Expected Discharge Plan: [place] Barriers to Discharge: Continued Medical Work up   Patient Goals and CMS Choice   CMS Medicare.gov Compare Post Acute Care list provided to:: Patient Choice offered to / list presented to : Patient, Adult Children  Expected Discharge Plan and Services Expected Discharge Plan: [place] Acute Care Choice: [place] arrangements for the past 2 months: [place] Arranged: PT, OT HH Agency: [facility] [place] Date [facility] Agency Contacted: 02/14/22   Representative spoke with at [place]: Juanda Crumble  Prior Living Arrangements/Services Living arrangements for the past 2 months: Single Family Home Lives with:: Self Patient language and need for interpreter reviewed:: Yes Do you feel safe going back to the place where you live?: Yes      Need for Family Participation in Patient Care: Yes (Comment) Care giver support system in place?: Yes (comment)   Criminal Activity/Legal Involvement Pertinent to Current Situation/Hospitalization: No - Comment  as needed  Activities of Daily Living Home Assistive Devices/Equipment: None ADL Screening (condition at time of admission) Patient's cognitive ability adequate to safely complete daily activities?: Yes Is the patient deaf or have difficulty hearing?: No Does the patient have difficulty seeing, even when wearing glasses/contacts?: No Does the patient have difficulty concentrating, remembering, or making decisions?: No Patient able to express need for assistance with ADLs?: Yes Does the patient have difficulty dressing or bathing?: No Independently performs ADLs?: Yes (appropriate for developmental age) Does the patient have difficulty walking or climbing stairs?: No Weakness of Legs: Both Weakness of Arms/Hands: None  Permission Sought/Granted Permission sought to share information with : Facility Sport and exercise psychologist, Family Supports Permission granted to share information with : Yes, Verbal Permission Granted  Share Information with NAME: Talayeh Bruinsma  Permission granted to share info w AGENCY: [facility] [place] granted to share info w Relationship: Daughter in [place] granted to share info w Contact Information: [phone]  Emotional Assessment Appearance:: Appears stated age Attitude/Demeanor/Rapport: Gracious, Engaged Affect (typically observed): Pleasant Orientation: : Oriented to Self, Oriented to Place, Oriented to  Time, Oriented to Situation Alcohol / Substance Use: Never Used Psych Involvement: No (comment)  Admission diagnosis:  Hyponatremia [E87.1] Nausea [R11.0] Chronic left-sided low back pain with left-sided sciatica [M54.42, G89.29] Patient Active Problem List   Diagnosis Date Noted   Intractable nausea and vomiting 02/14/2022   Elevated troponin 02/14/2022   Paroxysmal atrial  fibrillation ([place]) 55/25/8948   Metabolic acidosis 34/75/8307   Chronic kidney disease, stage 3b ([place]) 02/14/2022   Prolonged QT interval 02/14/2022    Sciatica neuralgia 02/14/2022   Hyponatremia 02/13/2022   PCP:  [facility]:   [place], [place] - [address] [place] [address] Phone: [phone] Fax: [phone]     Social Determinants of Health (SDOH) Interventions    Readmission Risk Interventions     View : No data to display.

## 2022-02-14 NOTE — Progress Notes (Signed)
  Progress Note   Patient: Cheyenne [name] EZM:[id number] DOB: 1927-11-07 DOA: 02/13/2022     1 DOS: the patient was seen and examined on 02/14/2022   Brief hospital course: ISSABELLE [name] is a 88 y.o. female with medical history significant for paroxysmal A-fib on Eliquis, peripheral vascular disease/coronary artery disease status post PCI with stent placement 3 months ago, essential hypertension, CKD 3B, chronic sciatica pain on chronic opioids, who presented to [facility] ED with complaints of persistent nausea and vomiting x1 today.   Patient sodium level was 125, she was placed on normal saline.  Sodium level dropped down to 123 on 5/31.  Assessment and Plan: Hyponatremia. Intractable nausea vomiting. Patient no longer has any nausea vomiting, will change diet to heart healthy to avoid excess amount of fluid. Discontinue IV fluids.  Start salt tablets 1 g twice a day. Continue symptomatic treatment for nausea vomiting.  Avoid Zofran for prolonged QT interval.  Minimal elevation of troponin. Coronary artery disease. Paroxysmal atrial fibrillation. Prolonged QT interval. Conditions are stable, patient has a minimal elevation troponin, currently not a concern.  Continue anticoagulation.  Chronic kidney disease stage IIIb. Metabolic acidosis. Continue sodium bicarbonate.  Renal function still stable.     Subjective:  Patient has some weakness today, some nausea, but no vomiting.  Tolerating diet.  Physical Exam: Vitals:   02/13/22 2321 02/14/22 0435 02/14/22 0805 02/14/22 1223  BP: (!) 160/51 (!) 142/47 (!) 164/70 (!) 152/52  Pulse: 87  67 65  Resp: '15 18 19 17  '$ Temp: 98.1 F (36.7 C)  97.8 F (36.6 C) 97.9 F (36.6 C)  TempSrc: Oral  Oral Oral  SpO2: 95% 96% 97% 98%  Weight:      Height:       General exam: Appears calm and comfortable  Respiratory system: Clear to auscultation. Respiratory effort normal. Cardiovascular system: S1 & S2 heard, RRR. No JVD, murmurs, rubs,  gallops or clicks. No pedal edema. Gastrointestinal system: Abdomen is nondistended, soft and nontender. No organomegaly or masses felt. Normal bowel sounds heard. Central nervous system: Alert and oriented. No focal neurological deficits. Extremities: Symmetric 5 x 5 power. Skin: No rashes, lesions or ulcers Psychiatry: Judgement and insight appear normal. Mood & affect appropriate.   Data Reviewed:  Lab reviewed  Family Communication: son updated  Disposition: Status is: Inpatient Remains inpatient appropriate because: severity of disease.   Planned Discharge Destination: Home with Home Health    Time spent: 35 minutes  Author: Sharen Hones, MD 02/14/2022 1:53 PM  For on call review [email or web address].

## 2022-02-14 NOTE — Progress Notes (Signed)
*  PRELIMINARY RESULTS* Echocardiogram 2D Echocardiogram has been performed.  Cheyenne [name] 02/14/2022, 2:24 PM

## 2022-02-14 NOTE — Plan of Care (Signed)

## 2022-02-14 NOTE — Progress Notes (Addendum)
Patient is a 81 yof with complaints of pain, nausea, vomiting and weakness.  Patient arrives unit from the ED. She is Alert and oriented. NS going at 75 ml/hr. Patient  is on Room air with oxygen saturations in the 90s.  20 G IV present in the right wrist. Patient complains of nausea, and hip pain. Oxycodone administered and compazine given for nausea. She is on a full liquid diet due to the nausea and vomiting. . NSR in the 60s. Last night patient's Heart rate dropped to the low 30s but she was asymptomatic. This happened twice and though it did not sustain, Neomia Glass, Nurse Practitioner was informed.  Patient complains of some dryness in eyes. Liquifilm tears ordered and administered. Patient is on Lovenox. She is able to ambulate to the rest room with some assistance. Skin is in tact. She takes all her pills this shift whole with water.  History of A-fib. Currently on Eliquis.

## 2022-02-14 NOTE — Evaluation (Signed)
Occupational Therapy Evaluation Patient Details Name: Cheyenne [name] MRN: [id number] DOB: September 14, 1928 Today's Date: 02/14/2022   History of Present Illness Pt is a 88 y.o. female with medical history significant for paroxysmal A-fib on Eliquis, peripheral vascular disease/coronary artery disease status post PCI with stent placement 3 months ago, essential hypertension, CKD 3B, chronic sciatica pain on chronic opioids, who presented to [facility] ED with complaints of persistent nausea and vomiting. MD assessment includes: hypovolemic hyponatremia, intractable nausea and vomiting, and elevated troponin.   Clinical Impression   Patient presenting with decreased Ind in self care, balance, functional mobility/transfers, endurance, and safety awareness. Patient reports living at home alone and being Mod I with use of Rollator or SPC. Family takes her to appointments as she does not drive and neighbors and friends being her meals.  Pt is reporting pain and asking to return to bed. She ambulates to bathroom with min HHA and is able to perform clothing management and hygiene without assistance. Patient currently functioning at supervision- min guard for safety. She reports having several people to assist her at discharge. Patient will benefit from acute OT to increase overall independence in the areas of ADLs, functional mobility, and safety awareness in order to safely discharge home.      Recommendations for follow up therapy are one component of a multi-disciplinary discharge planning process, led by the attending physician.  Recommendations may be updated based on patient status, additional functional criteria and insurance authorization.   Follow Up Recommendations  Home health OT    Assistance Recommended at Discharge Intermittent Supervision/Assistance  Patient can return home with the following A little help with walking and/or transfers;A little help with bathing/dressing/bathroom;Help with stairs or  ramp for entrance;Assist for transportation;Assistance with cooking/housework    Functional Status Assessment  Patient has had a recent decline in their functional status and demonstrates the ability to make significant improvements in function in a reasonable and predictable amount of time.  Equipment Recommendations  None recommended by OT       Precautions / Restrictions Precautions Precautions: Fall Restrictions Weight Bearing Restrictions: No Other Position/Activity Restrictions: Watch HR      Mobility Bed Mobility Overal bed mobility: Modified Independent                  Transfers Overall transfer level: Needs assistance Equipment used: 1 person hand held assist Transfers: Sit to/from Stand, Bed to chair/wheelchair/BSC Sit to Stand: Supervision     Step pivot transfers: Min guard            Balance Overall balance assessment: No apparent balance deficits (not formally assessed)                                         ADL either performed or assessed with clinical judgement   ADL Overall ADL's : Needs assistance/impaired                         Toilet Transfer: Min guard;Ambulation Toilet Transfer Details (indicate cue type and reason): HHA Toileting- Clothing Manipulation and Hygiene: Supervision/safety;Sit to/from stand       Functional mobility during ADLs: Supervision/safety;Min guard General ADL Comments: with HHA     Vision Patient Visual Report: No change from baseline              Pertinent Vitals/Pain Pain Assessment Pain Assessment: Faces  Faces Pain Scale: Hurts even more Pain Location: chronic sciatica Pain Descriptors / Indicators: Aching, Sore Pain Intervention(s): Monitored during session, Repositioned, Patient requesting pain meds-RN notified        Extremity/Trunk Assessment Upper Extremity Assessment Upper Extremity Assessment: Generalized weakness   Lower Extremity Assessment Lower  Extremity Assessment: Generalized weakness       Communication Communication Communication: No difficulties   Cognition Arousal/Alertness: Awake/alert Behavior During Therapy: WFL for tasks assessed/performed Overall Cognitive Status: Within Functional Limits for tasks assessed                                                  Home Living Family/patient expects to be discharged to:: Private residence Living Arrangements: Alone Available Help at Discharge: Family;Available 24 hours/day;Neighbor;Friend(s) Type of Home: House Home Access: Level entry     Home Layout: One level     Bathroom Shower/Tub: Teacher, early years/pre: Handicapped height     Home Equipment: Rollator (4 wheels);Cane - single point          Prior Functioning/Environment Prior Level of Function : Independent/Modified Independent             Mobility Comments: Mod Ind amb with a rollator or SPC, no fall history ADLs Comments: Ind with ADLs. Neighbors bring meals and family drives her to appointments        OT Problem List: Decreased strength;Decreased activity tolerance;Impaired balance (sitting and/or standing);Pain;Decreased safety awareness;Decreased knowledge of use of DME or AE      OT Treatment/Interventions: Self-care/ADL training;Modalities;Balance training;Therapeutic exercise;Energy conservation;Therapeutic activities;Patient/family education;Manual therapy    OT Goals(Current goals can be found in the care plan section) Acute Rehab OT Goals Patient Stated Goal: to feel better and go home OT Goal Formulation: With patient Time For Goal Achievement: 02/28/22 Potential to Achieve Goals: Good ADL Goals Pt Will Perform Grooming: with modified independence Pt Will Perform Lower Body Dressing: with modified independence Pt Will Transfer to Toilet: with modified independence Pt Will Perform Toileting - Clothing Manipulation and hygiene: with modified  independence  OT Frequency: Min 2X/week       AM-PAC OT "6 Clicks" Daily Activity     Outcome Measure Help from another person eating meals?: None Help from another person taking care of personal grooming?: None Help from another person toileting, which includes using toliet, bedpan, or urinal?: A Little Help from another person bathing (including washing, rinsing, drying)?: A Little Help from another person to put on and taking off regular upper body clothing?: None Help from another person to put on and taking off regular lower body clothing?: A Little 6 Click Score: 21   End of Session Equipment Utilized During Treatment: Rolling walker (2 wheels) Nurse Communication: Mobility status  Activity Tolerance: Patient tolerated treatment well Patient left: in bed;with call bell/phone within reach;with bed alarm set;with family/visitor present  OT Visit Diagnosis: Unsteadiness on feet (R26.81);Repeated falls (R29.6);Muscle weakness (generalized) (M62.81)                Time: 6314-9702 OT Time Calculation (min): 14 min Charges:  OT General Charges $OT Visit: 1 Visit OT Evaluation $OT Eval Low Complexity: 1 Low OT Treatments $Self Care/Home Management : 8-22 mins  Darleen Crocker, MS, OTR/L , CBIS ascom [phone]  02/14/22, 12:54 PM

## 2022-02-14 NOTE — Progress Notes (Signed)
This shift this patient's heart rate dropped into the 30s. But this was not sustained. Patient is also asymptomatic and HR comes back up to baseline of mid 60s. Nurse Practitioner was informed.

## 2022-02-14 NOTE — Evaluation (Signed)
Physical Therapy Evaluation Patient Details Name: Cheyenne [name] MRN: [id number] DOB: [birth year] Today's Date: 02/14/2022  History of Present Illness  Pt is a 88 y.o. female with medical history significant for paroxysmal A-fib on Eliquis, peripheral vascular disease/coronary artery disease status post PCI with stent placement 3 months ago, essential hypertension, CKD 3B, chronic sciatica pain on chronic opioids, who presented to [facility] ED with complaints of persistent nausea and vomiting. MD assessment includes: hypovolemic hyponatremia, intractable nausea and vomiting, and elevated troponin.   Clinical Impression  Pt was pleasant and motivated to participate during the session and put forth good effort throughout. Pt was steady with transfers with good eccentric and concentric control.  Pt was able to ambulate with fair cadence and with good stability with no adverse symptoms noted but HR increased quickly from the upper 90s at rest to a high of 150 bpm with pt returned to sitting and MD/nurse notified.  Pt' HR returned back to the upper 90s to low 100s in <30 sec upon returning to sitting.  Pt will benefit from HHPT upon discharge to safely address deficits listed in patient problem list for decreased caregiver assistance and eventual return to PLOF.         Recommendations for follow up therapy are one component of a multi-disciplinary discharge planning process, led by the attending physician.  Recommendations may be updated based on patient status, additional functional criteria and insurance authorization.  Follow Up Recommendations Home health PT    Assistance Recommended at Discharge Intermittent Supervision/Assistance  Patient can return home with the following  A little help with walking and/or transfers;Assist for transportation    Equipment Recommendations None recommended by PT  Recommendations for Other Services       Functional Status Assessment Patient has had a recent  decline in their functional status and demonstrates the ability to make significant improvements in function in a reasonable and predictable amount of time.     Precautions / Restrictions Precautions Precautions: Fall Restrictions Weight Bearing Restrictions: No Other Position/Activity Restrictions: Watch HR      Mobility  Bed Mobility               General bed mobility comments: NT, pt sitting at EOB upon entry to room    Transfers Overall transfer level: Needs assistance Equipment used: Rolling walker (2 wheels) Transfers: Sit to/from Stand Sit to Stand: Supervision           General transfer comment: Good eccentric and concentric control and stability    Ambulation/Gait Ambulation/Gait assistance: Supervision Gait Distance (Feet): 50 Feet Assistive device: Rolling walker (2 wheels) Gait Pattern/deviations: Step-through pattern, Decreased step length - right, Decreased step length - left Gait velocity: decreased     General Gait Details: Pt steady with amb with distance limited to 50 feet by this PT secondary to HR increasing from upper 90s at rest to a high of 150 bpm.  HR returned to baseline in <30 sec upon return to sitting with no adverse symptoms noted by the pt.  Stairs            Wheelchair Mobility    Modified Rankin (Stroke Patients Only)       Balance Overall balance assessment: No apparent balance deficits (not formally assessed)  Pertinent Vitals/Pain Pain Assessment Pain Assessment: 0-10 Pain Score: 8  Pain Location: chronic sciatica Pain Descriptors / Indicators: Aching, Sore Pain Intervention(s): Repositioned, Premedicated before session, Monitored during session, Patient requesting pain meds-RN notified    Home Living Family/patient expects to be discharged to:: Private residence Living Arrangements: Alone Available Help at Discharge: Family;Available 24  hours/day Type of Home: House Home Access: Level entry       Home Layout: One level Home Equipment: Rollator (4 wheels);Cane - single point      Prior Function Prior Level of Function : Independent/Modified Independent             Mobility Comments: Mod Ind amb with a rollator or SPC, no fall history ADLs Comments: Ind with ADLs     Hand Dominance        Extremity/Trunk Assessment   Upper Extremity Assessment Upper Extremity Assessment: Generalized weakness    Lower Extremity Assessment Lower Extremity Assessment: Generalized weakness       Communication   Communication: No difficulties  Cognition Arousal/Alertness: Awake/alert Behavior During Therapy: WFL for tasks assessed/performed Overall Cognitive Status: Within Functional Limits for tasks assessed                                          General Comments      Exercises Total Joint Exercises Ankle Circles/Pumps: AROM, Strengthening, Both, 10 reps Quad Sets: Strengthening, Both, 10 reps Gluteal Sets: Strengthening, Both, 10 reps Long Arc Quad: AROM, Strengthening, Both, 10 reps, 15 reps Knee Flexion: AROM, Strengthening, Both, 15 reps, 10 reps Other Exercises Other Exercises: HEP education for BLE APs, QS, GS, and LAQ   Assessment/Plan    PT Assessment Patient needs continued PT services  PT Problem List Decreased strength;Decreased activity tolerance;Decreased mobility       PT Treatment Interventions DME instruction;Gait training;Functional mobility training;Therapeutic activities;Therapeutic exercise;Balance training;Patient/family education    PT Goals (Current goals can be found in the Care Plan section)  Acute Rehab PT Goals Patient Stated Goal: To get stronger PT Goal Formulation: With patient Time For Goal Achievement: 02/27/22 Potential to Achieve Goals: Good    Frequency Min 2X/week     Co-evaluation               AM-PAC PT "6 Clicks" Mobility   Outcome Measure Help needed turning from your back to your side while in a flat bed without using bedrails?: A Little Help needed moving from lying on your back to sitting on the side of a flat bed without using bedrails?: A Little Help needed moving to and from a bed to a chair (including a wheelchair)?: A Little Help needed standing up from a chair using your arms (e.g., wheelchair or bedside chair)?: A Little Help needed to walk in hospital room?: A Little Help needed climbing 3-5 steps with a railing? : A Little 6 Click Score: 18    End of Session Equipment Utilized During Treatment: Gait belt Activity Tolerance: Other (comment) (amb limited by this PT secondary to tachycardia) Patient left: in chair;with call bell/phone within reach;with chair alarm set;with family/visitor present Nurse Communication: Mobility status;Other (comment) (Nsg, MD notified of pt's HR to 150 with limited ambulation) PT Visit Diagnosis: Muscle weakness (generalized) (M62.81);Difficulty in walking, not elsewhere classified (R26.2)    Time: 9798-9211 PT Time Calculation (min) (ACUTE ONLY): 23 min   Charges:   PT Evaluation $PT  Eval Moderate Complexity: 1 Mod PT Treatments $Therapeutic Exercise: 8-22 mins        D. Royetta Asal PT, DPT 02/14/22, 11:33 AM

## 2022-02-15 LAB — GLUCOSE, CAPILLARY: Glucose-Capillary: 113 mg/dL — ABNORMAL HIGH (ref 70–99)

## 2022-02-15 LAB — ECHOCARDIOGRAM COMPLETE
AR max vel: 3.1 cm2
AV Area VTI: 3.44 cm2
AV Area mean vel: 3.22 cm2
AV Mean grad: 3 mmHg
AV Peak grad: 5.2 mmHg
Ao pk vel: 1.14 m/s
Area-P 1/2: 2.68 cm2
Height: 61 in
MV VTI: 2.6 cm2
S' Lateral: 2.14 cm
Weight: 2016 [oz_av]

## 2022-02-15 LAB — BASIC METABOLIC PANEL
Anion gap: 8 (ref 5–15)
BUN: 23 mg/dL (ref 8–23)
CO2: 22 mmol/L (ref 22–32)
Calcium: 9.3 mg/dL (ref 8.9–10.3)
Chloride: 103 mmol/L (ref 98–111)
Creatinine, Ser: 0.99 mg/dL (ref 0.44–1.00)
GFR, Estimated: 54 mL/min — ABNORMAL LOW (ref >60)
Glucose, Bld: 104 mg/dL — ABNORMAL HIGH (ref 70–99)
Potassium: 4.2 mmol/L (ref 3.5–5.1)
Sodium: 133 mmol/L — ABNORMAL LOW (ref 135–145)

## 2022-02-15 LAB — MAGNESIUM: Magnesium: 2 mg/dL (ref 1.7–2.4)

## 2022-02-15 MED ORDER — PANTOPRAZOLE SODIUM 40 MG PO TBEC: 40.0000 mg | DELAYED_RELEASE_TABLET | Freq: Every day | ORAL | 0 refills | Status: DC

## 2022-02-15 MED ORDER — SODIUM BICARBONATE 650 MG PO TABS: 650.0000 mg | ORAL_TABLET | Freq: Three times a day (TID) | ORAL | 0 refills | Status: AC

## 2022-02-15 NOTE — Progress Notes (Signed)
Physical Therapy Treatment Patient Details Name: Cheyenne [name] MRN: [id number] DOB: 1927-10-16 Today's Date: 02/15/2022   History of Present Illness Pt is a 88 y.o. female with medical history significant for paroxysmal A-fib on Eliquis, peripheral vascular disease/coronary artery disease status post PCI with stent placement 3 months ago, essential hypertension, CKD 3B, chronic sciatica pain on chronic opioids, who presented to [facility] ED with complaints of persistent nausea and vomiting. MD assessment includes: hypovolemic hyponatremia, intractable nausea and vomiting, and elevated troponin.    PT Comments    Pt seen for PT tx. Pt received attempting to get out of bed, noted to be off telemetry as nurse reports pt is anticipating d/c soon. PT offered to ambulate with pt & pt ambulates into hallway with RW & supervision. Back in room pt performs STS without UE support with focus on BLE strengthening. Pt c/o feeling tired & weak but otherwise okay. Pt reports she plans to d/c home with daughter's assistance. Continue to recommend HHPT.    Recommendations for follow up therapy are one component of a multi-disciplinary discharge planning process, led by the attending physician.  Recommendations may be updated based on patient status, additional functional criteria and insurance authorization.  Follow Up Recommendations  Home health PT     Assistance Recommended at Discharge Intermittent Supervision/Assistance  Patient can return home with the following A little help with walking and/or transfers;Assist for transportation   Equipment Recommendations  None recommended by PT    Recommendations for Other Services       Precautions / Restrictions Precautions Precautions: Fall Restrictions Weight Bearing Restrictions: No Other Position/Activity Restrictions: Watch HR     Mobility  Bed Mobility Overal bed mobility: Modified Independent             General bed mobility comments:  supine<>sit    Transfers Overall transfer level: Needs assistance Equipment used: Rolling walker (2 wheels) Transfers: Sit to/from Stand Sit to Stand: Supervision                Ambulation/Gait Ambulation/Gait assistance: Supervision Gait Distance (Feet): 85 Feet Assistive device: Rolling walker (2 wheels) Gait Pattern/deviations: Decreased step length - right, Decreased step length - left Gait velocity: decreased         Stairs             Wheelchair Mobility    Modified Rankin (Stroke Patients Only)       Balance     Sitting balance-Leahy Scale: Good     Standing balance support: Bilateral upper extremity supported, During functional activity Standing balance-Leahy Scale: Good                              Cognition Arousal/Alertness: Awake/alert Behavior During Therapy: WFL for tasks assessed/performed, Flat affect Overall Cognitive Status: Within Functional Limits for tasks assessed                                          Exercises Other Exercises Other Exercises: Pt performed 10x STS from EOB without BUE support but BLE slightly leaning posterior on EOB with focus on BLE & overall strengthening.    General Comments        Pertinent Vitals/Pain Pain Assessment Pain Assessment: No/denies pain    Home Living  Prior Function            PT Goals (current goals can now be found in the care plan section)      Frequency    Min 2X/week      PT Plan Current plan remains appropriate    Co-evaluation              AM-PAC PT "6 Clicks" Mobility   Outcome Measure  Help needed turning from your back to your side while in a flat bed without using bedrails?: None Help needed moving from lying on your back to sitting on the side of a flat bed without using bedrails?: None Help needed moving to and from a bed to a chair (including a wheelchair)?: A Little Help  needed standing up from a chair using your arms (e.g., wheelchair or bedside chair)?: A Little Help needed to walk in hospital room?: A Little Help needed climbing 3-5 steps with a railing? : A Little 6 Click Score: 20    End of Session   Activity Tolerance: Patient tolerated treatment well Patient left: in bed;with call bell/phone within reach;with bed alarm set Nurse Communication: Mobility status PT Visit Diagnosis: Muscle weakness (generalized) (M62.81);Difficulty in walking, not elsewhere classified (R26.2)     Time: 4967-5916 PT Time Calculation (min) (ACUTE ONLY): 8 min  Charges:  $Therapeutic Activity: 8-22 mins                     Lavone Nian, PT, DPT 02/15/22, 11:38 AM   Waunita Schooner 02/15/2022, 11:37 AM

## 2022-02-15 NOTE — TOC Transition Note (Signed)
Transition of Care [facility]) - CM/SW Discharge Note   Patient Details  Name: CLARIVEL CALLAWAY MRN: [id number] Date of Birth: 12-18-1927  Transition of Care [facility]) CM/SW Contact:  Candie Chroman, LCSW Phone Number: 02/15/2022, 12:09 PM   Clinical Narrative:  Patient has orders to discharge home today. [employer] representative is aware. No further concerns. CSW signing off.   Final next level of care: Home w Home Health Services Barriers to Discharge: Barriers Resolved   Patient Goals and CMS Choice   CMS Medicare.gov Compare Post Acute Care list provided to:: Patient Choice offered to / list presented to : Patient, Adult Children  Discharge Placement                    Patient and family notified of of transfer: 02/15/22  Discharge Plan and Services     Post Acute Care Choice: [place] Arranged: PT, OT [facility] Agency: [facility] Date [facility] Agency Contacted: 02/15/22   Representative spoke with at [place]: Juanda Crumble  Social Determinants of Health (SDOH) Interventions     Readmission Risk Interventions     View : No data to display.

## 2022-02-15 NOTE — Progress Notes (Signed)
OT Cancellation Note  Patient Details Name: [name] MRN: [id number] DOB: [birth year]   Cancelled Treatment:    Reason Eval/Treat Not Completed: Other (comment). OT treatment attempted but pt's HR is 145 bpm while seated in recliner chair. OT going by to offer assistance and check on pt and placing pillow per pt request for comfort. OT will re-attempt when pt is next able to actively and safely participate.   Darleen Crocker, MS, OTR/L , CBIS ascom [phone]  02/15/22, 10:20 AM

## 2022-02-15 NOTE — Discharge Summary (Signed)
Physician Discharge Summary   Patient: Cheyenne [name] MRN: [id number] DOB: 03/11/1928  Admit date:     02/13/2022  Discharge date: 02/15/22  Discharge Physician: Sharen Hones   PCP: [facility]   Recommendations at discharge:    Follow with pcp in 1 week Follow with palliative care as outpatient  Discharge Diagnoses: Principal Problem:   Hyponatremia Active Problems:   Intractable nausea and vomiting   Elevated troponin   Paroxysmal atrial fibrillation (HCC)   Metabolic acidosis   Chronic kidney disease, stage 3b (HCC)   Prolonged QT interval   Sciatica neuralgia  Resolved Problems:   * No resolved hospital problems. *  Hospital Course: Cheyenne [name] is a 88 y.o. 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   Patient sodium level was 125, she was placed on normal saline.  Sodium level dropped down to 123 on 5/31.    Assessment and Plan:  Hyponatremia. Intractable nausea vomiting. Patient no longer has any nausea vomiting, will change diet to heart healthy to avoid excess amount of fluid. Patient condition had improved, sodium level went up to 132 today.  Patient is medically stable to be discharged.  At this point, I will discontinue Aldactone, continue torsemide.  Follow-up with PCP in 1 week and check a BMP at that time.     Minimal elevation of troponin. Coronary artery disease. Paroxysmal atrial fibrillation. Prolonged QT interval. Chronic diastolic congestive heart failure. Conditions are stable, patient has a minimal elevation troponin, currently not a concern.  Continue anticoagulation. Patient appears to have significant debility, she developed short of breath  and tachycardia with walking.  I will restart torsemide for chronic diastolic congestive heart failure.  Patient had echocardiogram showed ejection fraction 60 to 65% with grade 1 diastolic dysfunction.  Chronic kidney disease stage IIIb. Metabolic acidosis. Functions better, will continue sodium bicarb for additional 3 days.  Bicarb has increased to 22 today.  Patient long-term prognosis is poor due to multiple medical problems.  Palliative care has been seeing patient, we will continue to follow.       Consultants: None Procedures performed: None  Disposition: Home health Diet recommendation:  Discharge Diet Orders (From admission, onward)     Start     Ordered   02/15/22 0000  Diet - low sodium heart healthy        02/15/22 1034           Cardiac diet DISCHARGE MEDICATION: Allergies as of 02/15/2022   No Known Allergies      Medication List     STOP taking these medications    Calcium Carbonate-Vitamin D 600-5 MG-MCG Tabs   Entresto 24-26 MG Generic drug: sacubitril-valsartan   etodolac 400 MG tablet Commonly known as: LODINE   GoodSense Aspirin Low Dose 81 MG tablet Generic drug: aspirin EC   meloxicam 15 MG tablet Commonly known as: MOBIC   Multi-Vitamin tablet   omeprazole 40 MG capsule Commonly known as: PRILOSEC   ondansetron 8 MG disintegrating tablet Commonly known as: ZOFRAN-ODT   Oxycodone HCl 10 MG Tabs   spironolactone 25 MG tablet Commonly known as: ALDACTONE   tiZANidine 2 MG tablet Commonly known as: ZANAFLEX   traMADol 50 MG tablet Commonly known as: Veatrice Bourbon  TAKE these medications    cetirizine 10 MG tablet Commonly known as: ZYRTEC Take by mouth.   clopidogrel 75 MG tablet Commonly known as: PLAVIX Take 75 mg by mouth daily.   cyanocobalamin 1000 MCG tablet TAKE 1 TABLET EVERY DAY FOR VITAMIN B12 DEFICIENCY   Eliquis 2.5 MG Tabs tablet Generic drug: apixaban Take 2.5 mg by mouth 2 (two) times daily.    HYDROcodone-acetaminophen 5-325 MG tablet Commonly known as: NORCO/VICODIN Take 1 tablet by mouth every 4 (four) hours as needed.   Jardiance 10 MG Tabs tablet Generic drug: empagliflozin Take 10 mg by mouth daily.   lisinopril 2.5 MG tablet Commonly known as: ZESTRIL Take 2.5 mg by mouth daily.   metoprolol succinate 25 MG 24 hr tablet Commonly known as: TOPROL-XL Take 25 mg by mouth daily.   naloxone 4 MG/0.1ML Liqd nasal spray kit Commonly known as: NARCAN SMARTSIG:Both Nares   nitroGLYCERIN 0.4 MG SL tablet Commonly known as: NITROSTAT   pantoprazole 40 MG tablet Commonly known as: Protonix Take 1 tablet (40 mg total) by mouth daily.   pregabalin 150 MG capsule Commonly known as: LYRICA Take 150 mg by mouth 2 (two) times daily. What changed: Another medication with the same name was removed. Continue taking this medication, and follow the directions you see here.   sodium bicarbonate 650 MG tablet Take 1 tablet (650 mg total) by mouth 3 (three) times daily for 3 days.   torsemide 20 MG tablet Commonly known as: DEMADEX Take 20 mg by mouth daily.        Follow-up Information     [facility] Follow up in 1 week(s).   Contact information: [place] [address] [phone]                Discharge Exam: Danley Danker Weights   02/13/22 1435  Weight: 57.2 kg   General exam: Appears calm and comfortable  Respiratory system: Decreased breath sounds without crackles. Respiratory effort normal. Cardiovascular system: Regular with some tachycardia. No JVD, murmurs, rubs, gallops or clicks. No pedal edema. Gastrointestinal system: Abdomen is nondistended, soft and nontender. No organomegaly or masses felt. Normal bowel sounds heard. Central nervous system: Alert and oriented. No focal neurological deficits. Extremities: Symmetric 5 x 5 power. Skin: No rashes, lesions or ulcers Psychiatry: Judgement and insight appear normal. Mood  & affect appropriate.    Condition at discharge: good  The results of significant diagnostics from this hospitalization (including imaging, microbiology, ancillary and laboratory) are listed below for reference.   Imaging Studies: DG Chest 2 View  Result Date: 02/13/2022 CLINICAL DATA:  Weakness, hyponatremia EXAM: CHEST - 2 VIEW COMPARISON:  11/02/2013 FINDINGS: Lungs are clear.  No pleural effusion or pneumothorax. The heart is normal in size. Stable moderate wedging of a lower thoracic/upper lumbar vertebral body, unchanged. Surgical clips along the right chest wall/axilla. IMPRESSION: Normal chest radiographs. Electronically Signed   By: Julian Hy M.D.   On: 02/13/2022 18:12   CT HEAD WO CONTRAST (5MM)  Result Date: 02/14/2022 CLINICAL DATA:  A 88 year old female presents for evaluation of neuro deficit. EXAM: CT HEAD WITHOUT CONTRAST TECHNIQUE: Contiguous axial images were obtained from the base of the skull through the vertex without intravenous contrast. RADIATION DOSE REDUCTION: This exam was performed according to the departmental dose-optimization program which includes automated exposure control, adjustment of the mA and/or kV according to patient size and/or use of iterative reconstruction technique. COMPARISON:  Imaging from October of 2009. FINDINGS:  Brain: No evidence of acute infarction, hemorrhage, hydrocephalus, extra-axial collection or mass lesion/mass effect. Signs of atrophy with evidence of chronic microvascular ischemic change in deep white matter. Vascular: No hyperdense vessel or unexpected calcification. Skull: Normal. Negative for fracture or focal lesion. Sinuses/Orbits: Mild LEFT sphenoid sinus disease with chronic appearance. Visualize paranasal sinuses and orbits are otherwise unremarkable to the extent evaluated. Other: None. IMPRESSION: 1. No acute intracranial abnormality. 2. Signs of atrophy with evidence of chronic microvascular ischemic change in deep white  matter. 3. Mild LEFT sphenoid sinus disease with chronic appearance. Electronically Signed   By: Zetta Bills M.D.   On: 02/14/2022 16:39   ECHOCARDIOGRAM COMPLETE  Result Date: 02/15/2022    ECHOCARDIOGRAM REPORT   Patient Name:   KARESA MAULTSBY [facility] Date of Exam: 02/14/2022 Medical Rec #:  [id number]         Height:       61.0 in Accession #:    [id number]        Weight:       126.0 lb Date of Birth:  1928/07/01        BSA:          1.552 m Patient Age:    57 years          BP:           164/70 mmHg Patient Gender: F                 HR:           62 bpm. Exam Location:  [facility] Procedure: 2D Echo, Color Doppler and Cardiac Doppler Indications:     Elevated troponin  History:         Patient has no prior history of Echocardiogram examinations.                  Altered mental status.  Sonographer:     Charmayne Sheer Referring Phys:  [id number] [place] Diagnosing Phys: Isaias Cowman MD  Sonographer Comments: Suboptimal apical window. IMPRESSIONS  1. Left ventricular ejection fraction, by estimation, is 60 to 65%. The left ventricle has normal function. The left ventricle has no regional wall motion abnormalities. Left ventricular diastolic parameters are consistent with Grade I diastolic dysfunction (impaired relaxation).  2. Right ventricular systolic function is normal. The right ventricular size is normal.  3. The mitral valve is normal in structure. Mild to moderate mitral valve regurgitation. No evidence of mitral stenosis.  4. The aortic valve is normal in structure. Aortic valve regurgitation is not visualized. No aortic stenosis is present.  5. The inferior vena cava is normal in size with greater than 50% respiratory variability, suggesting right atrial pressure of 3 mmHg. FINDINGS  Left Ventricle: Left ventricular ejection fraction, by estimation, is 60 to 65%. The left ventricle has normal function. The left ventricle has no regional wall motion abnormalities. The left ventricular internal cavity size  was normal in size. There is  no left ventricular hypertrophy. Left ventricular diastolic parameters are consistent with Grade I diastolic dysfunction (impaired relaxation). Right Ventricle: The right ventricular size is normal. No increase in right ventricular wall thickness. Right ventricular systolic function is normal. Left Atrium: Left atrial size was normal in size. Right Atrium: Right atrial size was normal in size. Pericardium: There is no evidence of pericardial effusion. Mitral Valve: The mitral valve is normal in structure. Mild to moderate mitral valve regurgitation. No evidence of mitral valve stenosis. MV peak gradient, 5.2 mmHg.  The mean mitral valve gradient is 2.0 mmHg. Tricuspid Valve: The tricuspid valve is normal in structure. Tricuspid valve regurgitation is mild . No evidence of tricuspid stenosis. Aortic Valve: The aortic valve is normal in structure. Aortic valve regurgitation is not visualized. No aortic stenosis is present. Aortic valve mean gradient measures 3.0 mmHg. Aortic valve peak gradient measures 5.2 mmHg. Aortic valve area, by VTI measures 3.44 cm. Pulmonic Valve: The pulmonic valve was normal in structure. Pulmonic valve regurgitation is not visualized. No evidence of pulmonic stenosis. Aorta: The aortic root is normal in size and structure. Venous: The inferior vena cava is normal in size with greater than 50% respiratory variability, suggesting right atrial pressure of 3 mmHg. IAS/Shunts: No atrial level shunt detected by color flow Doppler.  LEFT VENTRICLE PLAX 2D LVIDd:         3.55 cm LVIDs:         2.14 cm LV PW:         0.88 cm LV IVS:        0.84 cm LVOT diam:     2.10 cm LV SV:         85 LV SV Index:   55 LVOT Area:     3.46 cm  RIGHT VENTRICLE RV Basal diam:  2.71 cm LEFT ATRIUM             Index        RIGHT ATRIUM           Index LA diam:        2.70 cm 1.74 cm/m   RA Area:     10.20 cm LA Vol (A2C):   28.4 ml 18.30 ml/m  RA Volume:   16.30 ml  10.50 ml/m LA Vol  (A4C):   23.3 ml 15.01 ml/m LA Biplane Vol: 26.0 ml 16.75 ml/m  AORTIC VALVE                    PULMONIC VALVE AV Area (Vmax):    3.10 cm     PV Vmax:       0.80 m/s AV Area (Vmean):   3.22 cm     PV Vmean:      51.900 cm/s AV Area (VTI):     3.44 cm     PV VTI:        0.125 m AV Vmax:           114.00 cm/s  PV Peak grad:  2.6 mmHg AV Vmean:          76.000 cm/s  PV Mean grad:  1.0 mmHg AV VTI:            0.247 m AV Peak Grad:      5.2 mmHg AV Mean Grad:      3.0 mmHg LVOT Vmax:         102.00 cm/s LVOT Vmean:        70.700 cm/s LVOT VTI:          0.245 m LVOT/AV VTI ratio: 0.99  AORTA Ao Root diam: 3.00 cm MITRAL VALVE MV Area (PHT): 2.68 cm     SHUNTS MV Area VTI:   2.60 cm     Systemic VTI:  0.24 m MV Peak grad:  5.2 mmHg     Systemic Diam: 2.10 cm MV Mean grad:  2.0 mmHg MV Vmax:       1.14 m/s MV Vmean:      63.1 cm/s MV Decel  Time: 283 msec MV E velocity: 78.00 cm/s MV A velocity: 101.00 cm/s MV E/A ratio:  0.77 Isaias Cowman MD Electronically signed by Isaias Cowman MD Signature Date/Time: 02/15/2022/9:20:24 AM    Final     Microbiology: Results for orders placed or performed during the hospital encounter of 02/13/22  Urine Culture     Status: Abnormal   Collection Time: 02/13/22  8:51 AM   Specimen: Urine, Clean Catch  Result Value Ref Range Status   Specimen Description   Final    URINE, CLEAN CATCH Performed at [facility], [address], [place], [place] [address]    Special Requests   Final    NONE Performed at [facility], [address] [place], [place] [address]    Culture (A)  Final    <10,000 COLONIES/mL INSIGNIFICANT GROWTH Performed at [facility] [address], [place], [place] [address]    Report Status 02/14/2022 FINAL  Final    Labs: CBC: Recent Labs  Lab 02/13/22 1445 02/14/22 0628  WBC 8.3 6.9  HGB 13.8 11.9*  HCT 39.6 33.0*  MCV 92.5 91.9  PLT 259 914   Basic Metabolic Panel: Recent Labs  Lab  02/13/22 1445 02/13/22 2058 02/14/22 0110 02/14/22 0628 02/15/22 0556  NA 125* 125* 124* 123* 133*  K 4.4  --   --  4.4 4.2  CL 92*  --   --  97* 103  CO2 20*  --   --  18* 22  GLUCOSE 102*  --   --  81 104*  BUN 31*  --   --  27* 23  CREATININE 1.29*  --   --  1.03* 0.99  CALCIUM 9.4  --   --  8.4* 9.3  MG  --   --   --  1.8 2.0  PHOS  --   --   --  3.9  --    Liver Function Tests: Recent Labs  Lab 02/13/22 1445 02/14/22 0628  AST 24 21  ALT 15 13  ALKPHOS 45 32*  BILITOT 1.1 0.9  PROT 7.6 5.8*  ALBUMIN 4.1 3.1*   CBG: Recent Labs  Lab 02/14/22 0802 02/14/22 1153 02/14/22 1645 02/14/22 2142 02/15/22 0733  GLUCAP 80 110* 133* 111* 113*    Discharge time spent: greater than 30 minutes.  Signed: Sharen Hones, MD [facility] 02/15/2022

## 2022-02-17 ENCOUNTER — Other Ambulatory Visit: Payer: Self-pay | Admitting: Gerontology

## 2022-02-17 DIAGNOSIS — R4182 Altered mental status, unspecified: Secondary | ICD-10-CM

## 2022-02-21 ENCOUNTER — Telehealth: Payer: Self-pay | Admitting: Primary Care

## 2022-02-21 NOTE — Telephone Encounter (Signed)
Spoke with patient's daughter Bethena Roys regarding the Palliative referral/services and all questions were answered and she was in agreement with scheduling visit with [place].  I have scheduled a Telehealth Consult for 02/22/22 @ 1:30 PM

## 2022-02-22 ENCOUNTER — Other Ambulatory Visit: Payer: Medicare HMO | Admitting: Primary Care

## 2022-02-22 DIAGNOSIS — N1832 Chronic kidney disease, stage 3b: Secondary | ICD-10-CM

## 2022-02-22 DIAGNOSIS — Z515 Encounter for palliative care: Secondary | ICD-10-CM

## 2022-02-22 DIAGNOSIS — E872 Acidosis, unspecified: Secondary | ICD-10-CM

## 2022-02-22 NOTE — Progress Notes (Signed)
    [facility] Consult Note Telephone: [phone]  Fax: [phone]    Date of encounter: 02/22/22 PATIENT NAME: RESA RINKS [address] [place] [address]   [phone] (home)  DOB: 1927/12/24 MRN: [id number] PRIMARY CARE PROVIDER:    [place],  [place] [address] [id number]  REFERRING PROVIDER:   [place] [address] [place],  [place] [address] [phone]  RESPONSIBLE PARTY:    Contact Information     Name Relation Home Work [place] Daughter [phone]  [phone]   [facility] Relative [phone]     [place], [place] [phone]     Aisa Schoeppner,Phyllis Niece   [phone]       Due to the COVID-19 crisis, this visit was done via telemedicine from my office and it was initiated and consent by this patient and or family.  I connected with  [name] OR PROXY on 02/22/22 by a video enabled telemedicine application and verified that I am speaking with the correct person using two identifiers.   I discussed the limitations of evaluation and management by telemedicine. The patient expressed understanding and agreed to proceed.   Palliative Care was asked to follow this patient by consultation request of  [place] to address advance care planning.                                   ASSESSMENT AND PLAN / RECOMMENDATIONS:   Advance Care Planning/Goals of Care: Goals include to maximize quality of life and symptom management. Patient/health care surrogate gave his/her permission to discuss.Our advance care planning conversation included a discussion about:    The value and importance of advance care planning  Experiences with loved ones who have been seriously ill or have died  Exploration of personal, cultural or spiritual beliefs that might influence medical decisions  Exploration of goals of care in the event of a sudden  injury or illness  Identification of a healthcare agent - daughter Bethena Roys acts as proxy Review  of an  advance directive document . Discussed having home health via [facility], which will be sent shortly. Discussed community resources and role of palliative medicine in patient management. CODE STATUS: States no prolonging, will do forms on my in home visit.  Follow up Palliative Care Visit: Palliative care will continue to follow for complex medical decision making, advance care planning, and clarification of goals. Return 4 weeks or prn.  I spent 20 minutes providing this consultation. More than 50% of the time in this consultation was spent in counseling and care coordination.  Thank you for the opportunity to participate in the care of Ms. Killman.  The palliative care team will continue to follow. Please call our office at [phone] if we can be of additional assistance.   Jason Coop DNP, MPH, AGPCNP-BC, ACHPN   COVID-19 PATIENT SCREENING TOOL Asked and negative response unless otherwise noted:   Have you had symptoms of covid, tested positive or been in contact with someone with symptoms/positive test in the past 5-10 days?

## 2022-03-07 DIAGNOSIS — M5417 Radiculopathy, lumbosacral region: Secondary | ICD-10-CM | POA: Diagnosis not present

## 2022-03-13 ENCOUNTER — Other Ambulatory Visit: Payer: Medicare HMO | Admitting: Primary Care

## 2022-03-13 DIAGNOSIS — N1832 Chronic kidney disease, stage 3b: Secondary | ICD-10-CM | POA: Diagnosis not present

## 2022-03-13 DIAGNOSIS — M5432 Sciatica, left side: Secondary | ICD-10-CM

## 2022-03-13 DIAGNOSIS — Z515 Encounter for palliative care: Secondary | ICD-10-CM | POA: Diagnosis not present

## 2022-03-13 NOTE — Progress Notes (Signed)
 Therapist, nutritional Palliative Care Consult Note Telephone: [phone]  Fax: [phone]   Date of encounter: 03/13/22 12:16 PM PATIENT NAME: Cheyenne [name] [address] [place] [address]   [phone] (home)  DOB: 1927/10/04 MRN: [id number] PRIMARY CARE PROVIDER:    Steva Clotilda DEL, NP,  [address] [place] [address] [phone]  REFERRING PROVIDER:   Steva Clotilda DEL, NP [address] [place],  [place] [address] [phone]  RESPONSIBLE PARTY:    Contact Information     Name Relation Home Work Cheyenne [name] Daughter [phone]  [phone]   [facility] Relative [phone]     Rether, Rison [phone]     Siddarth Hsiung,Phyllis Niece   [phone]       I met face to face with patient and family in  home. Palliative Care was asked to follow this patient by consultation request of  Steva Clotilda DEL, NP to address advance care planning and complex medical decision making. This is the initial visit.                                     ASSESSMENT AND PLAN / RECOMMENDATIONS:   Advance Care Planning/Goals of Care: Goals include to maximize quality of life and symptom management. Patient/health care surrogate gave his/her permission to discuss.Our advance care planning conversation included a discussion about:    The value and importance of advance care planning  Experiences with loved ones who have been seriously ill or have died  Exploration of personal, cultural or spiritual beliefs that might influence medical decisions  Exploration of goals of care in the event of a sudden injury or illness  Identification of a healthcare agent -Dagoberto Finder Review and creation of an  advance directive document . Decision not to resuscitate due to poor prognosis. CODE STATUS: DNR   I completed a MOST form today. The patient outlined her wishes for the following treatment decisions:  Cardiopulmonary  Resuscitation: Do Not Attempt Resuscitation (DNR/No CPR)  Medical Interventions: Limited Additional Interventions: Use medical treatment, IV fluids and cardiac monitoring as indicated, DO NOT USE intubation or mechanical ventilation. May consider use of less invasive airway support such as BiPAP or CPAP. Also provide comfort measures. Transfer to the hospital if indicated. Avoid intensive care.   Antibiotics: Antibiotics if indicated  IV Fluids: IV fluids for a defined trial period  Feeding Tube: No feeding tube   I spent 20 minutes providing this consultation. More than 50% of the time in this consultation was spent in counseling and care coordination. ------------------------------------------------------------------------------------------------------------------------------ Symptom Management/Plan:  Pain;  Has L sciatica,  doing PT now, well care.. Discussed increasing lyrica  to tid for pain management. Discussed acupressure, acupuncture. Has a good mattress. Takes tylenol  instead of nsaids. Can get relief from lying down.Uses cold packs for relief. Recommend titration of  lyrica  up.  CHF: Endorses DOE, poor endurance. On meds and followed by cardiology, continue.  Renal function: slight improvement, encouraged to drink water, follow creatinine.  Nutrition: Had nausea and dehydration, hyponatremia. States poor appetite, takes boost/supplements.  Endorses needing to drink more water. Weight is stable.   Mobility: No recent falls, uses cane and walker in home. Discussed safety features such as bathroom equipment, grab bars. Continue to have home PT assess and follow.  Follow up Palliative Care Visit: Palliative care will continue to follow for complex medical decision making, advance care  planning, and clarification of goals. Return 4-6 weeks or prn.  This visit was coded based on medical decision making (MDM).  PPS: 50%  HOSPICE ELIGIBILITY/DIAGNOSIS: TBD  Chief Complaint: debility,  pain  HISTORY OF PRESENT ILLNESS:  Cheyenne [name] is a 88 y.o. year old female  with l sciatica, CHF, debility, low vision. Patient seen today to review palliative care needs to include medical decision making and advance care planning as appropriate.   History obtained from review of EMR, discussion with primary team, and interview with family, facility staff/caregiver and/or Ms. Raine.  I reviewed available labs, medications, imaging, studies and related documents from the EMR.  Records reviewed and summarized above.   ROS   General: NAD EYES: denies vision changes, endorses low vision ENMT: denies dysphagia Cardiovascular: denies chest pain, denies DOE Pulmonary: denies cough, denies increased SOB Abdomen: endorses good appetite, denies constipation, endorses continence of bowel GU: denies dysuria, endorses continence of urine MSK:  denies increased weakness,  endorses limited stamina, no falls reported Skin: denies rashes or wounds Neurological: denies pain, denies insomnia Psych: Endorses positive mood Heme/lymph/immuno: denies bruises, abnormal bleeding  Physical Exam: Current and past weights: 116 lbs recently, has re gained to 118 lbs. Constitutional: NAD General: frail appearing, thin EYES: anicteric sclera, lids intact, no discharge , low vision ENMT: intact hearing, oral mucous membranes moist, dentition intact CV: S1S2, RRR, no LE edema Pulmonary: LCTA, no increased work of breathing, no cough, room air Abdomen: intake 80%, normo-active BS + 4 quadrants, soft and non tender, no ascites GU: deferred MSK: moderate  sarcopenia, moves all extremities, ambulatory Skin: warm and dry, no rashes or wounds on visible skin Neuro:  + generalized weakness,  no cognitive impairment Psych: non-anxious affect, A and O x 3 Hem/lymph/immuno: no widespread bruising  CURRENT PROBLEM LIST:  Patient Active Problem List   Diagnosis Date Noted   Intractable nausea and vomiting  02/14/2022   Elevated troponin 02/14/2022   Paroxysmal atrial fibrillation (HCC) 02/14/2022   Metabolic acidosis 02/14/2022   Chronic kidney disease, stage 3b (HCC) 02/14/2022   Prolonged QT interval 02/14/2022   Sciatica neuralgia 02/14/2022   Hyponatremia 02/13/2022   PAST MEDICAL HISTORY:  Active Ambulatory Problems    Diagnosis Date Noted   Hyponatremia 02/13/2022   Intractable nausea and vomiting 02/14/2022   Elevated troponin 02/14/2022   Paroxysmal atrial fibrillation (HCC) 02/14/2022   Metabolic acidosis 02/14/2022   Chronic kidney disease, stage 3b (HCC) 02/14/2022   Prolonged QT interval 02/14/2022   Sciatica neuralgia 02/14/2022   Resolved Ambulatory Problems    Diagnosis Date Noted   No Resolved Ambulatory Problems   No Additional Past Medical History   SOCIAL HX:  Social History   Tobacco Use   Smoking status: Never   Smokeless tobacco: Never  Substance Use Topics   Alcohol  use: Never   FAMILY HX: No family history on file.    ALLERGIES: No Known Allergies   PERTINENT MEDICATIONS:  Outpatient Encounter Medications as of 03/13/2022  Medication Sig   cetirizine (ZYRTEC) 10 MG tablet Take by mouth.   clopidogrel  (PLAVIX ) 75 MG tablet Take 75 mg by mouth daily.   cyanocobalamin 1000 MCG tablet TAKE 1 TABLET EVERY DAY FOR VITAMIN B12 DEFICIENCY   ELIQUIS  2.5 MG TABS tablet Take 2.5 mg by mouth 2 (two) times daily.   HYDROcodone -acetaminophen  (NORCO/VICODIN) 5-325 MG tablet Take 1 tablet by mouth every 4 (four) hours as needed.   JARDIANCE 10 MG TABS tablet Take 10  mg by mouth daily.   lisinopril  (ZESTRIL ) 2.5 MG tablet Take 2.5 mg by mouth daily.   metoprolol  succinate (TOPROL -XL) 25 MG 24 hr tablet Take 25 mg by mouth daily.   naloxone (NARCAN) nasal spray 4 mg/0.1 mL SMARTSIG:Both Nares   nitroGLYCERIN  (NITROSTAT ) 0.4 MG SL tablet    pantoprazole  (PROTONIX ) 40 MG tablet Take 1 tablet (40 mg total) by mouth daily.   pregabalin  (LYRICA ) 150 MG capsule Take  150 mg by mouth 2 (two) times daily.   torsemide  (DEMADEX ) 20 MG tablet Take 20 mg by mouth daily.   No facility-administered encounter medications on file as of 03/13/2022.   Thank you for the opportunity to participate in the care of Cheyenne [name].  The palliative care team will continue to follow. Please call our office at [phone] if we can be of additional assistance.   Lamarr Welby Sharps, NP , DNP, AGPCNP-BC  COVID-19 PATIENT SCREENING TOOL Asked and negative response unless otherwise noted:  Have you had symptoms of covid, tested positive or been in contact with someone with symptoms/positive test in the past 5-10 days?

## 2022-03-14 ENCOUNTER — Ambulatory Visit: Payer: Medicare HMO

## 2022-03-21 DIAGNOSIS — M545 Low back pain, unspecified: Secondary | ICD-10-CM | POA: Diagnosis not present

## 2022-03-21 DIAGNOSIS — G588 Other specified mononeuropathies: Secondary | ICD-10-CM | POA: Diagnosis not present

## 2022-03-21 DIAGNOSIS — M47896 Other spondylosis, lumbar region: Secondary | ICD-10-CM | POA: Diagnosis not present

## 2022-03-21 DIAGNOSIS — M48061 Spinal stenosis, lumbar region without neurogenic claudication: Secondary | ICD-10-CM | POA: Diagnosis not present

## 2022-03-21 DIAGNOSIS — M25552 Pain in left hip: Secondary | ICD-10-CM | POA: Diagnosis not present

## 2022-03-21 DIAGNOSIS — M5416 Radiculopathy, lumbar region: Secondary | ICD-10-CM | POA: Diagnosis not present

## 2022-04-16 ENCOUNTER — Other Ambulatory Visit: Payer: Medicare HMO | Admitting: Primary Care

## 2022-04-16 DIAGNOSIS — Z515 Encounter for palliative care: Secondary | ICD-10-CM

## 2022-04-16 DIAGNOSIS — M5432 Sciatica, left side: Secondary | ICD-10-CM

## 2022-04-16 NOTE — Progress Notes (Signed)
[facility] Consult Note Telephone: [phone]  Fax: [phone]    Date of encounter: 04/16/22 11:44 AM PATIENT NAME: Cheyenne [name] [address] [place] [address]   [phone] (home)  DOB: 1927/12/03 MRN: [id number] PRIMARY CARE PROVIDER:    Latanya Maudlin, [name],  [place] [address] [phone]  REFERRING PROVIDER:   Latanya Maudlin, [name],  [place] [address] [phone]  RESPONSIBLE PARTY:    Contact Information     Name Relation Home Work Cheyenne [name] Daughter [phone]  [phone]   Cheyenne [name] Relative [phone]     Cheyenne [name], Cheyenne [name] [phone]     Cheyenne [name],Cheyenne [name] Niece   [phone]        I met face to face with patient  in  home. Palliative Care was asked to follow this patient by consultation request of  Cheyenne [name] to address advance care planning and complex medical decision making. This is a follow up visit.                                   ASSESSMENT AND PLAN / RECOMMENDATIONS:   Advance Care Planning/Goals of Care: Goals include to maximize quality of life and symptom management. Patient/health care surrogate gave his/her permission to discuss.Our advance care planning conversation included a discussion about:    The value and importance of advance care planning  Experiences with loved ones who have been seriously ill or have died  Exploration of personal, cultural or spiritual beliefs that might influence medical decisions  Exploration of goals of care in the event of a sudden injury or illness  Acknowledges her age and debility CODE STATUS: DNR  Symptom Management/Plan:  Pain:  Has been taking half a tab of pain pills without relief.  States "a nurse" told her to take a half pill I have Instructed to take whole tablet up to q4 hrs, and additional tylenol. < 3 gm/ day. Has appt for Aug 16 for sciatic  ablation. Patient appears to be very painful.endorses spinal stenosis. Is not interested in SSRI, although I think it would help with pain management. Outlines death of her son at age 66. She cared for him during this time.  Nutrition: Taking small meals.  Niece brings food. She endorses good appetite.  Edema: Has compression  stockings and elevates. Non pitting. I asked her to trial her torsemide again, several days a week. Her BP was elevated as well, 150/80. She will likely discuss with PCP.  Caregiving: Discussed hiring caregiving help to offset her fatigue and to help her daughter who has cancer. She has a niece who also checks in with her daily. She states it will be  a good idea to try to find a helper a few days a week.  Mobility: Has a rollator which she uses intermittently, and also a cane. I encouraged her to use the rollator more for stability and rest. She also has one given to her  so may need one suited to her size. May need PT after ablation.  Follow up Palliative Care Visit: Palliative care will continue to follow for complex medical decision making, advance care planning, and clarification of goals. Return 4-6 weeks or prn.  I spent 40 minutes providing this consultation. More than 50% of the time in this consultation was spent in counseling and care coordination.  PPS: 50%  HOSPICE ELIGIBILITY/DIAGNOSIS: no  Chief Complaint: chronic pain  HISTORY OF PRESENT ILLNESS:  Cheyenne [name] is a 88 y.o. year old female  with CAD, chronic pain, OA.Patient seen today to review palliative care needs to include medical decision making and advance care planning as appropriate.   History obtained from review of EMR, discussion with primary team, and interview with family, facility staff/caregiver and/or Cheyenne [name].  I reviewed available labs, medications, imaging, studies and related documents from the EMR.  Records reviewed and summarized above.   ROS   General: painful  EYES:  denies vision changes ENMT: denies dysphagia Cardiovascular: denies chest pain, denies DOE Pulmonary: denies cough, denies increased SOB Abdomen: endorses good appetite, denies constipation, endorses continence of bowel GU: denies dysuria, endorses continence of urine MSK:  denies  increased weakness,  no falls reported Skin: denies rashes or wounds Neurological: endorses pain, denies insomnia Psych: Endorses discouraged  mood  Physical Exam: Current and past weights: unavailable Constitutional: NAD General: frail appearing, thin EYES: anicteric sclera, lids intact, no discharge  ENMT: intact hearing, oral mucous membranes moist, dentition intact CV: 2-3+ non pitting bil LE edema Pulmonary:  no increased work of breathing, no cough, room air Abdomen: intake 80%, no ascites MSK: no sarcopenia, moves all extremities, ambulatory Skin: warm and dry, no rashes or wounds on visible skin Neuro:  + generalized weakness,  no cognitive impairment, anxious affect   Thank you for the opportunity to participate in the care of Cheyenne [name].  The palliative care team will continue to follow. Please call our office at [phone] if we can be of additional assistance.   Cheyenne Coop, NP DNP, AGPCNP-BC  COVID-19 PATIENT SCREENING TOOL Asked and negative response unless otherwise noted:   Have you had symptoms of covid, tested positive or been in contact with someone with symptoms/positive test in the past 5-10 days?

## 2022-05-09 DIAGNOSIS — I2510 Atherosclerotic heart disease of native coronary artery without angina pectoris: Secondary | ICD-10-CM | POA: Diagnosis not present

## 2022-05-09 DIAGNOSIS — R600 Localized edema: Secondary | ICD-10-CM | POA: Diagnosis not present

## 2022-05-09 DIAGNOSIS — N182 Chronic kidney disease, stage 2 (mild): Secondary | ICD-10-CM | POA: Diagnosis not present

## 2022-05-09 DIAGNOSIS — I739 Peripheral vascular disease, unspecified: Secondary | ICD-10-CM | POA: Diagnosis not present

## 2022-05-09 DIAGNOSIS — I480 Paroxysmal atrial fibrillation: Secondary | ICD-10-CM | POA: Diagnosis not present

## 2022-05-09 DIAGNOSIS — I10 Essential (primary) hypertension: Secondary | ICD-10-CM | POA: Diagnosis not present

## 2022-05-09 DIAGNOSIS — R0609 Other forms of dyspnea: Secondary | ICD-10-CM | POA: Diagnosis not present

## 2022-05-09 DIAGNOSIS — Z95820 Peripheral vascular angioplasty status with implants and grafts: Secondary | ICD-10-CM | POA: Diagnosis not present

## 2022-05-09 DIAGNOSIS — N184 Chronic kidney disease, stage 4 (severe): Secondary | ICD-10-CM | POA: Diagnosis not present

## 2022-05-16 DIAGNOSIS — F4321 Adjustment disorder with depressed mood: Secondary | ICD-10-CM | POA: Diagnosis not present

## 2022-05-16 DIAGNOSIS — E559 Vitamin D deficiency, unspecified: Secondary | ICD-10-CM | POA: Diagnosis not present

## 2022-05-16 DIAGNOSIS — Z09 Encounter for follow-up examination after completed treatment for conditions other than malignant neoplasm: Secondary | ICD-10-CM | POA: Diagnosis not present

## 2022-05-16 DIAGNOSIS — E538 Deficiency of other specified B group vitamins: Secondary | ICD-10-CM | POA: Diagnosis not present

## 2022-05-16 DIAGNOSIS — I5043 Acute on chronic combined systolic (congestive) and diastolic (congestive) heart failure: Secondary | ICD-10-CM | POA: Diagnosis not present

## 2022-05-16 DIAGNOSIS — I480 Paroxysmal atrial fibrillation: Secondary | ICD-10-CM | POA: Diagnosis not present

## 2022-05-16 DIAGNOSIS — I2510 Atherosclerotic heart disease of native coronary artery without angina pectoris: Secondary | ICD-10-CM | POA: Diagnosis not present

## 2022-05-16 DIAGNOSIS — F419 Anxiety disorder, unspecified: Secondary | ICD-10-CM | POA: Diagnosis not present

## 2022-05-16 DIAGNOSIS — R030 Elevated blood-pressure reading, without diagnosis of hypertension: Secondary | ICD-10-CM | POA: Diagnosis not present

## 2022-05-18 DIAGNOSIS — R112 Nausea with vomiting, unspecified: Secondary | ICD-10-CM | POA: Diagnosis not present

## 2022-05-18 DIAGNOSIS — I480 Paroxysmal atrial fibrillation: Secondary | ICD-10-CM | POA: Diagnosis not present

## 2022-05-18 DIAGNOSIS — R1110 Vomiting, unspecified: Secondary | ICD-10-CM | POA: Diagnosis not present

## 2022-05-18 DIAGNOSIS — M549 Dorsalgia, unspecified: Secondary | ICD-10-CM | POA: Diagnosis not present

## 2022-05-18 DIAGNOSIS — F419 Anxiety disorder, unspecified: Secondary | ICD-10-CM | POA: Diagnosis not present

## 2022-05-18 DIAGNOSIS — Z853 Personal history of malignant neoplasm of breast: Secondary | ICD-10-CM | POA: Diagnosis not present

## 2022-05-18 DIAGNOSIS — I2510 Atherosclerotic heart disease of native coronary artery without angina pectoris: Secondary | ICD-10-CM | POA: Diagnosis not present

## 2022-05-18 DIAGNOSIS — R600 Localized edema: Secondary | ICD-10-CM | POA: Diagnosis not present

## 2022-05-18 DIAGNOSIS — I739 Peripheral vascular disease, unspecified: Secondary | ICD-10-CM | POA: Diagnosis not present

## 2022-05-18 DIAGNOSIS — R410 Disorientation, unspecified: Secondary | ICD-10-CM | POA: Diagnosis not present

## 2022-05-18 DIAGNOSIS — I509 Heart failure, unspecified: Secondary | ICD-10-CM | POA: Diagnosis not present

## 2022-05-18 DIAGNOSIS — R140 Abdominal distension (gaseous): Secondary | ICD-10-CM | POA: Diagnosis not present

## 2022-05-18 DIAGNOSIS — N184 Chronic kidney disease, stage 4 (severe): Secondary | ICD-10-CM | POA: Diagnosis not present

## 2022-05-18 DIAGNOSIS — R5383 Other fatigue: Secondary | ICD-10-CM | POA: Diagnosis not present

## 2022-05-19 DIAGNOSIS — R1110 Vomiting, unspecified: Secondary | ICD-10-CM | POA: Diagnosis not present

## 2022-05-20 DIAGNOSIS — R112 Nausea with vomiting, unspecified: Secondary | ICD-10-CM | POA: Diagnosis not present

## 2022-05-24 ENCOUNTER — Other Ambulatory Visit: Payer: Medicare HMO | Admitting: Primary Care

## 2022-05-24 DIAGNOSIS — R54 Age-related physical debility: Secondary | ICD-10-CM

## 2022-05-24 DIAGNOSIS — Z515 Encounter for palliative care: Secondary | ICD-10-CM

## 2022-05-24 DIAGNOSIS — M5432 Sciatica, left side: Secondary | ICD-10-CM

## 2022-05-24 NOTE — Progress Notes (Signed)
[facility] Consult Note Telephone: [phone]  Fax: [phone]    Date of encounter: 05/24/22 10:24 AM PATIENT NAME: Cheyenne [name] [address] [place] [address]   [phone] (home)  DOB: [birth year] MRN: [id number] PRIMARY CARE PROVIDER:    Latanya Maudlin, NP,  [place] [address] [phone]  REFERRING PROVIDER:   Latanya Maudlin, NP [place],  [place] [address] [phone]  RESPONSIBLE PARTY:    Contact Information     Name Relation Home Work [place] Daughter [phone]  [phone]   [facility] Relative [phone]     Cheyenne, [name] [phone]     Cheyenne [name],Cheyenne [name]   [phone]        I met face to face with patient in   home. Palliative Care was asked to follow this patient by consultation request of  Cheyenne Maudlin, NP to address advance care planning and complex medical decision making. This is a follow up visit.                                   ASSESSMENT AND PLAN / RECOMMENDATIONS:   Advance Care Planning/Goals of Care: Goals include to maximize quality of life and symptom management. Patient/health care surrogate gave his/her permission to discuss.Our advance care planning conversation included a discussion about:     Exploration of personal, cultural or spiritual beliefs that might influence medical decisions  Exploration of goals of care in the event of a sudden injury or illness  Identification of a healthcare agent -daughter Review of an  advance directive document .- done last visit no changes Spoke with POA about planning for declining function and needing some help with adls and iadls. CODE STATUS: DNR  Symptom Management/Plan:  Pain: State pain is worse and has run out of lyrica, has had 2 dose hiatus.  Is conservative with opioid, discussed QoL and I encouraged her to take more to be able to function.  Also using cold, heat. We discussed acupuncture, she may investigate.  Sleep: Doing well, pain fortunately does not impact sleep.   Nutrition: Eating well, denies constipation.  QoL States poor due to such chronic and intense pain, usually 9-10/10. We discussed getting home assistance to help with adls, she would like to start with cleaners, errands. I have given her some names to start calling for services. She would also benefit from more social interaction and some hobbies once her pain is better managed.   Depression: States she is not sure she is, but we discussed action of SSRI on pain management. She will try increased opioids first, and we can revisit if pain not better controlled. Also taking Lyrica.   Follow up Palliative Care Visit: Palliative care will continue to follow for complex medical decision making, advance care planning, and clarification of goals. Return 6 weeks or prn.  I spent 60 minutes providing this consultation. More than 50% of the time in this consultation was spent in counseling and care coordination.  PPS: 40%  HOSPICE ELIGIBILITY/DIAGNOSIS: TBD  Chief Complaint: chronic and severe nerve pain  HISTORY OF PRESENT ILLNESS:  Cheyenne [name] is a 88 y.o. year old female  with OA, chronic sciatic pain, spinal stenosis . Patient seen today to review palliative care needs to include medical decision making and advance care planning as appropriate.   History obtained from  review of EMR, discussion with primary team, and interview with family, facility staff/caregiver and/or Cheyenne [name].  I reviewed available labs, medications, imaging, studies and related documents from the EMR.  Records reviewed and summarized above.   ROS   General: NAD EYES: denies vision changes, has glasses ENMT: denies dysphagia Cardiovascular: denies chest pain, denies DOE Pulmonary: denies cough, denies increased SOB Abdomen: endorses good appetite, denies constipation, endorses  continence of bowel GU: denies dysuria, endorses continence of urine MSK:  denies  increased weakness,  no falls reported Skin: denies rashes or wounds Neurological:endorses extreme  pain, denies insomnia Psych: Endorses fatigue , discouraged  Physical Exam: Current and past weights: stable  Constitutional: NAD General: frail appearing, thin  EYES: anicteric sclera, lids intact, no discharge  ENMT: intact hearing, oral mucous membranes moist, dentition intact CV: no LE edema Pulmonary, no increased work of breathing, no cough, room air Abdomen: intake 100%, soft and non tender, no ascites MSK: + sarcopenia, moves all extremities, ambulatory Skin: warm and dry, no rashes or wounds on visible skin Neuro:  + generalized weakness,  no cognitive impairment, anxious affect   Thank you for the opportunity to participate in the care of Cheyenne [name].  The palliative care team will continue to follow. Please call our office at [phone] if we can be of additional assistance.   Cheyenne Coop, NP DNP, AGPCNP-BC  COVID-19 PATIENT SCREENING TOOL Asked and negative response unless otherwise noted:   Have you had symptoms of covid, tested positive or been in contact with someone with symptoms/positive test in the past 5-10 days?

## 2022-06-11 ENCOUNTER — Ambulatory Visit
Admission: RE | Admit: 2022-06-11 | Discharge: 2022-06-11 | Disposition: A | Discharge status: Home / Self Care | Payer: Self-pay | Source: Ambulatory Visit | Attending: Orthopedic Surgery | Admitting: Orthopedic Surgery

## 2022-06-11 ENCOUNTER — Telehealth: Payer: Self-pay

## 2022-06-11 ENCOUNTER — Other Ambulatory Visit: Payer: Self-pay

## 2022-06-11 DIAGNOSIS — Z049 Encounter for examination and observation for unspecified reason: Secondary | ICD-10-CM

## 2022-06-11 NOTE — Telephone Encounter (Signed)
We still need the MRI report Also need PT notes if there are any. Once we receive the above, you can schedule her with Marzetta Board or Dr Izora Ribas (whoever has the sooner opening).

## 2022-06-11 NOTE — Telephone Encounter (Signed)
Patient calls office today and states she is in so much pain. She is requesting an appointment as soon as possible. Please call patient at your earliest convenience to schedule an appointment. Marland Kitchen

## 2022-06-11 NOTE — Telephone Encounter (Signed)
I reprinted what the daughter sent me via email. She has been requesting an appt since 05/28/2022.

## 2022-06-11 NOTE — Telephone Encounter (Addendum)
I received her CD on Friday. Just downloaded it into Epic. It has an MRI from 08/2021 done at [facility]. I think we are missing the report for it. I think we are also missing her notes.

## 2022-06-11 NOTE — Telephone Encounter (Signed)
Left message for Judy to return my call

## 2022-06-12 NOTE — Telephone Encounter (Signed)
MRI report scanned. She is scheduled for 06/21/22. Per daughter she has done [facility] therapy. I looked in the [facility] chart did some some orders.

## 2022-06-20 NOTE — Progress Notes (Unsigned)
    Referring Physician:  Latanya Maudlin, NP [place],  [place] [address]  Primary Physician:  Latanya Maudlin, NP  History of Present Illness: 06/20/2022 Cheyenne [name] is here today with a chief complaint of ***  Back pain? Left Leg pain?  Duration: *** Location: *** Quality: *** Severity: *** 10/10 Precipitating: aggravated by ***walking, standing?? Modifying factors: made better by ***nothing? Weakness: none Timing: ***constant Bowel/Bladder Dysfunction: none  Conservative measures:  Physical therapy: *** has not participated in?  Multimodal medical therapy including regular antiinflammatories: *** oxycodone, hydrocodone, lyrica, tizanidine, meloxicam Injections: *** has had epidural steroid injections 09/01/21: L4-5 TF ESI Left Hip injection  Past Surgery: ***denies  Cheyenne [name] has ***no symptoms of cervical myelopathy.  The symptoms are causing a significant impact on the patient's life.   Review of Systems:  A 10 point review of systems is negative, except for the pertinent positives and negatives detailed in the HPI.  Past Medical History: No past medical history on file.  Past Surgical History: Past Surgical History:  Procedure Laterality Date   CARDIAC SURGERY  08/17/2021   3 stints and a Balloon.    Allergies: Allergies as of 06/21/2022   (No Known Allergies)    Medications: No outpatient medications have been marked as taking for the 06/21/22 encounter (Appointment) with Meade Maw, MD.    Social History: Social History   Tobacco Use   Smoking status: Never   Smokeless tobacco: Never  Vaping Use   Vaping Use: Never used  Substance Use Topics   Alcohol use: Never   Drug use: Never    Family Medical History: No family history on file.  Physical Examination: There were no vitals filed for this visit.  General: Patient is well developed, well nourished, calm, collected, and in no apparent  distress. Attention to examination is appropriate.  Neck:   Supple.  Full range of motion.  Respiratory: Patient is breathing without any difficulty.   NEUROLOGICAL:     Awake, alert, oriented to person, place, and time.  Speech is clear and fluent. Fund of knowledge is appropriate.   Cranial Nerves: Pupils equal round and reactive to light.  Facial tone is symmetric.  Facial sensation is symmetric. Shoulder shrug is symmetric. Tongue protrusion is midline.  There is no pronator drift.  ROM of spine: full.    Strength: Side Biceps Triceps Deltoid Interossei Grip Wrist Ext. Wrist Flex.  R '5 5 5 5 5 5 5  '$ L '5 5 5 5 5 5 5   '$ Side Iliopsoas Quads Hamstring PF DF EHL  R '5 5 5 5 5 5  '$ L '5 5 5 5 5 5   '$ Reflexes are ***2+ and symmetric at the biceps, triceps, brachioradialis, patella and achilles.   Hoffman's is absent.   Bilateral upper and lower extremity sensation is intact to light touch.    No evidence of dysmetria noted.  Gait is normal.     Medical Decision Making  Imaging: ***  I have personally reviewed the images and agree with the above interpretation.  Assessment and Plan: Cheyenne [name] is a pleasant 88 y.o. female with ***   I spent a total of *** minutes in face-to-face and non-face-to-face activities related to this patient's care today.  Thank you for involving me in the care of this patient.      Mujahid Jalomo K. Izora Ribas MD, [facility]

## 2022-06-21 ENCOUNTER — Ambulatory Visit: Payer: Medicare HMO | Admitting: Neurosurgery

## 2022-06-21 ENCOUNTER — Encounter: Payer: Self-pay | Admitting: Neurosurgery

## 2022-06-21 VITALS — BP 124/64 | Ht 61.0 in | Wt 128.8 lb

## 2022-06-21 DIAGNOSIS — G8929 Other chronic pain: Secondary | ICD-10-CM | POA: Diagnosis not present

## 2022-06-21 DIAGNOSIS — M5442 Lumbago with sciatica, left side: Secondary | ICD-10-CM

## 2022-06-21 DIAGNOSIS — M5416 Radiculopathy, lumbar region: Secondary | ICD-10-CM

## 2022-06-26 ENCOUNTER — Telehealth: Payer: Self-pay

## 2022-06-26 NOTE — Telephone Encounter (Incomplete)
Please see below for information in regards to your upcoming surgery:  Planned surgery: Left L4-5 laminoforaminotomy   Surgery date: *** - you will find out your arrival time the business day before your surgery.   Blood thinners: ***ok to stay on aspirin '81mg'$  if cardiac history Aspirin:   if taking as a preventative, stop aspirin 7 days prior, resume aspirin 14 days after      Eliquis:   stop Eliquis 72 hours prior, resume Eliquis 14 days after  Plavix:     Stop Plavix 7 days prior, resume Plavix 14 days after  ***Jardiance - hold for 3 days prior to surgery   ***GLP-1 and SGLT2 meds: GLP-1 antagonists taken weekly be held for 7 days prior to surgery. GLP-1 antagonists taken daily be held the day of surgery These include: Dulaglutide (Trulicity) Exenatide extended release (Bydureon BCise) Exenatide (Byetta) Semaglutide (Ozempic; Reagen.Frieze) Tirzepatide (Mounjaro) Liraglutide (Victoza, Saxenda) Lixisenatide (Adlyxin)  ***Semaglutide (Rybelsus) is oral and should be held for 3 days ***Metformin - should be held for 2 days prior to surgery  ***SGLT2 inhibitors should be held for 3 days (not longer because of the risk of euglycemic diabetic ketoacidosis) and include: Canagliflozin (Invokana) Ertugliflozin (Steglatro) Dapagliflozin (Farxiga) Empagliflozin (Jardiance)   Surgical clearance: we will send a clearance form to Dr Clayborn Bigness and Thornton Dales, NP   Pre-op appointment at [facility]: we will call you with a date/time for this. Pre-admit testing is located on the first floor of the [facility], [place], [address].   Pre-op labs may be done at your pre-op appointment. You are not required to fast for these labs.    Should you need to change your pre-op appointment, please call Pre-admit testing at [phone].    ***Home health physical therapy: [facility] [place] will contact you regarding home  health physical therapy for after surgery.Their number is [phone]. [place] (not [employer] [facility]) [facility] [employer] [facility] ***    If you have FMLA/disability paperwork, please drop it off or fax it to [phone], attention [place].   If you have any questions/concerns before or after surgery, you can reach [place] at [phone], or you can send a mychart message. If you have a concern after hours that cannot wait until normal business hours, you can call [phone] or [phone] and ask the answering service to page the neurosurgeon on call.    Appointments/FMLA & disability paperwork: [place] Nurse: Ophelia Shoulder  Medical assistant: Raquel Sarna Physician Assistant's: Cooper Render & Geronimo Boot Surgeon: Meade Maw, MD

## 2022-06-27 ENCOUNTER — Other Ambulatory Visit: Payer: Self-pay

## 2022-06-27 DIAGNOSIS — Z01818 Encounter for other preprocedural examination: Secondary | ICD-10-CM

## 2022-06-27 NOTE — Telephone Encounter (Signed)
I spoke with Cheyenne [name] and her daughter Cheyenne [name] about the information below. We will start planning for surgery on 07/16/22.

## 2022-07-03 ENCOUNTER — Encounter
Admission: RE | Admit: 2022-07-03 | Discharge: 2022-07-03 | Disposition: A | Discharge status: Home / Self Care | Payer: Medicare HMO | Source: Ambulatory Visit | Attending: Neurosurgery | Admitting: Neurosurgery

## 2022-07-03 ENCOUNTER — Other Ambulatory Visit: Payer: Self-pay

## 2022-07-03 VITALS — BP 137/73 | HR 65 | Resp 16 | Ht 61.0 in

## 2022-07-03 DIAGNOSIS — E871 Hypo-osmolality and hyponatremia: Secondary | ICD-10-CM | POA: Diagnosis not present

## 2022-07-03 DIAGNOSIS — Z01818 Encounter for other preprocedural examination: Secondary | ICD-10-CM | POA: Insufficient documentation

## 2022-07-03 DIAGNOSIS — Z01812 Encounter for preprocedural laboratory examination: Secondary | ICD-10-CM

## 2022-07-03 DIAGNOSIS — Z01810 Encounter for preprocedural cardiovascular examination: Secondary | ICD-10-CM

## 2022-07-03 DIAGNOSIS — I480 Paroxysmal atrial fibrillation: Secondary | ICD-10-CM | POA: Insufficient documentation

## 2022-07-03 DIAGNOSIS — N1832 Chronic kidney disease, stage 3b: Secondary | ICD-10-CM | POA: Diagnosis not present

## 2022-07-03 HISTORY — DX: Anemia, unspecified: D64.9

## 2022-07-03 LAB — SURGICAL PCR SCREEN
MRSA, PCR: NEGATIVE
Staphylococcus aureus: NEGATIVE

## 2022-07-03 LAB — URINALYSIS, COMPLETE (UACMP) WITH MICROSCOPIC
Bacteria, UA: NONE SEEN
Bilirubin Urine: NEGATIVE
Glucose, UA: 50 mg/dL — AB
Hgb urine dipstick: NEGATIVE
Ketones, ur: NEGATIVE mg/dL
Leukocytes,Ua: NEGATIVE
Nitrite: NEGATIVE
Protein, ur: NEGATIVE mg/dL
Specific Gravity, Urine: 1.006 (ref 1.005–1.030)
Squamous Epithelial / HPF: NONE SEEN (ref 0–5)
WBC, UA: NONE SEEN WBC/hpf (ref 0–5)
pH: 5 (ref 5.0–8.0)

## 2022-07-03 LAB — TYPE AND SCREEN
ABO/RH(D): A POS
Antibody Screen: NEGATIVE

## 2022-07-03 NOTE — Patient Instructions (Addendum)
Your procedure is scheduled on: 07/16/22 - Monday Report to the Registration Desk on the 1st floor of the [facility]. To find out your arrival time, please call [phone] between 1PM - 3PM on: 07/13/22 - Friday If your arrival time is 6:00 am, do not arrive prior to that time as the [facility] entrance doors do not open until 6:00 am.  REMEMBER: Instructions that are not followed completely may result in serious medical risk, up to and including death; or upon the discretion of your surgeon and anesthesiologist your surgery may need to be rescheduled.  Do not eat food after midnight the night before surgery.  No gum chewing, lozengers or hard candies.  You may however, drink CLEAR liquids up to 2 hours before you are scheduled to arrive for your surgery. Do not drink anything within 2 hours of your scheduled arrival time.  Clear liquids include: - water  - apple juice without pulp - gatorade (not RED colors) - black coffee or tea (Do NOT add milk or creamers to the coffee or tea) Do NOT drink anything that is not on this list.   TAKE THESE MEDICATIONS THE MORNING OF SURGERY WITH A SIP OF WATER:  - pantoprazole (PROTONIX)  - pregabalin (LYRICA) - HYDROcodone-acetaminophen if needed.  - We have instructed the patient to hold their blood thinner(s) for surgery as listed below: Eliquis: stop Eliquis 72 hours prior, resume Eliquis 14 days after.   - Plavix: stop Plavix 7 days prior, resume Plavix 14 days after .  - Jardiance - hold for 3 days prior to surgery.  One week prior to surgery: Stop Anti-inflammatories (NSAIDS) such as Advil, Aleve, Ibuprofen, Motrin, Naproxen, Naprosyn and Aspirin based products such as Excedrin, Goodys Powder, BC Powder.  Stop ANY OVER THE COUNTER supplements until after surgery.  You may however, continue to take Tylenol if needed for pain up until the day of surgery.  No Alcohol for 24 hours before or after surgery.  No Smoking including  e-cigarettes for 24 hours prior to surgery.  No chewable tobacco products for at least 6 hours prior to surgery.  No nicotine patches on the day of surgery.  Do not use any "recreational" drugs for at least a week prior to your surgery.  Please be advised that the combination of cocaine and anesthesia may have negative outcomes, up to and including death. If you test positive for cocaine, your surgery will be cancelled.  On the morning of surgery brush your teeth with toothpaste and water, you may rinse your mouth with mouthwash if you wish. Do not swallow any toothpaste or mouthwash.  Use CHG Soap or wipes as directed on instruction sheet.  Do not wear jewelry, make-up, hairpins, clips or nail polish.  Do not wear lotions, powders, or perfumes.   Do not shave body from the neck down 48 hours prior to surgery just in case you cut yourself which could leave a site for infection.  Also, freshly shaved skin may become irritated if using the CHG soap.  Contact lenses, hearing aids and dentures may not be worn into surgery.  Do not bring valuables to the hospital. [facility] is not responsible for any missing/lost belongings or valuables.   Notify your doctor if there is any change in your medical condition (cold, fever, infection).  Wear comfortable clothing (specific to your surgery type) to the hospital.  After surgery, you can help prevent lung complications by doing breathing exercises.  Take deep breaths  and cough every 1-2 hours. Your doctor may order a device called an Incentive Spirometer to help you take deep breaths. When coughing or sneezing, hold a pillow firmly against your incision with both hands. This is called "splinting." Doing this helps protect your incision. It also decreases belly discomfort.  If you are being admitted to the hospital overnight, leave your suitcase in the car. After surgery it may be brought to your room.  If you are being discharged the day of  surgery, you will not be allowed to drive home. You will need a responsible adult (18 years or older) to drive you home and stay with you that night.   If you are taking public transportation, you will need to have a responsible adult (18 years or older) with you. Please confirm with your physician that it is acceptable to use public transportation.   Please call the [facility] Dept. at [phone] if you have any questions about these instructions.  Surgery Visitation Policy:  Patients undergoing a surgery or procedure may have two family members or support persons with them as long as the person is not COVID-19 positive or experiencing its symptoms.   Inpatient Visitation:    Visiting hours are 7 a.m. to 8 p.m. Up to four visitors are allowed at one time in a patient room, including children. The visitors may rotate out with other people during the day. One designated support person (adult) may remain overnight.

## 2022-07-10 ENCOUNTER — Other Ambulatory Visit: Payer: Medicare HMO | Admitting: Primary Care

## 2022-07-10 DIAGNOSIS — Z515 Encounter for palliative care: Secondary | ICD-10-CM

## 2022-07-10 DIAGNOSIS — M5432 Sciatica, left side: Secondary | ICD-10-CM

## 2022-07-10 DIAGNOSIS — R54 Age-related physical debility: Secondary | ICD-10-CM

## 2022-07-10 NOTE — Progress Notes (Signed)
[facility] Consult Note Telephone: [phone]  Fax: [phone]    Date of encounter: 07/10/22 10:24 AM PATIENT NAME: Cheyenne [name] [address] [place] [address]   [phone] (home)  DOB: July 17, 1928 MRN: [id number] PRIMARY CARE PROVIDER:    Latanya Maudlin, NP,  [place] [address] [phone]  REFERRING PROVIDER:   Latanya Maudlin, NP [place],  [place] [address] [phone]  RESPONSIBLE PARTY:    Contact Information     Name Relation Home Work [place] Daughter [phone]  [phone]   [facility] Relative [phone]     Yajayra, Feldt [phone]     Samba Cumba,Phyllis Niece   [phone]        I met face to face with patient in home. Palliative Care was asked to follow this patient by consultation request of  Latanya Maudlin, NP to address advance care planning and complex medical decision making. This is a follow up visit.                                   ASSESSMENT AND PLAN / RECOMMENDATIONS:   Advance Care Planning/Goals of Care: Goals include to maximize quality of life and symptom management. Patient/health care surrogate gave his/her permission to discuss. Our advance care planning conversation included a discussion about:    The value and importance of advance care planning   Exploration of personal, cultural or spiritual beliefs that might influence medical decisions  Exploration of goals of care in the event of a sudden injury or illness  Identification of a healthcare agent daughter  CODE STATUS: DNR  Symptom Management/Plan:  Mobility: Discussed upcoming L4-5 laminoforaminotomy. Showed video about procedure. Cheyenne [name] from [facility] for PT, asked her to arrange in home home health as well as a helper for some weeks for her recovery.  Caregiving: Self care with effort due to extreme pain. Discussed her getting  help, she still wants to wait.  Nutrition: Good, she endorses good intake and no weight changes.  Pain; Still very significant when up and relieved when in bed. She is pursuing surgery due to her poor QoL with constant debilitating pain.  Follow up Palliative Care Visit: Palliative care will continue to follow for complex medical decision making, advance care planning, and clarification of goals. Return 3 weeks or prn.  I spent 40 minutes providing this consultation. More than 50% of the time in this consultation was spent in counseling and care coordination.  PPS: 40%  HOSPICE ELIGIBILITY/DIAGNOSIS: TBD  Chief Complaint: chronic pain  HISTORY OF PRESENT ILLNESS:  Cheyenne [name] is a 88 y.o. year old female  with chronic pain, debility, fall risk .   History obtained from review of EMR, discussion with primary team, and interview with family, facility staff/caregiver and/or Cheyenne [name].  I reviewed available labs, medications, imaging, studies and related documents from the EMR.  Records reviewed and summarized above.   ROS  General: NAD EYES: denies vision changes, has glasses ENMT: denies dysphagia Cardiovascular: denies chest pain, denies DOE Pulmonary: denies cough, denies increased SOB Abdomen: endorses good appetite, denies constipation, endorses continence of bowel GU: denies dysuria, endorses continence of urine MSK:  reports some increased weakness,  no falls reported Skin: denies rashes or wounds Neurological: endorses  8/10 L leg  pain, denies insomnia Psych: Endorses positive mood Heme/lymph/immuno: denies  bruises, abnormal bleeding  Physical Exam: Current and past weights: stable  Constitutional: NAD General: frail appearing, WNWD EYES: anicteric sclera, lids intact, no discharge  ENMT: intact hearing, oral mucous membranes moist, dentition intact CV: no LE edema Pulmonary: no increased work of breathing, no cough, room air Abdomen: intake 80%,  soft and  non tender, no ascites GU: deferred MSK: + sarcopenia, moves all extremities, ambulatory with cane Skin: warm and dry, no rashes or wounds on visible skin Neuro:  no  new generalized weakness,  no cognitive impairment Psych: non-anxious affect, A and O x 3 Hem/lymph/immuno: no widespread bruising   Thank you for the opportunity to participate in the care of Cheyenne [name].  The palliative care team will continue to follow. Please call our office at [phone] if we can be of additional assistance.   Jason Coop, NP   COVID-19 PATIENT SCREENING TOOL Asked and negative response unless otherwise noted:   Have you had symptoms of covid, tested positive or been in contact with someone with symptoms/positive test in the past 5-10 days?

## 2022-07-12 ENCOUNTER — Encounter: Payer: Self-pay | Admitting: Neurosurgery

## 2022-07-12 NOTE — Progress Notes (Signed)
Perioperative Services  Pre-Admission/Anesthesia Testing Clinical Review  Date: 07/13/22  Patient Demographics:  Name: Cheyenne [name] DOB:   1928-02-04 MRN:   [id number]  Planned Surgical Procedure(s):    Case: [id number] Date/Time: 07/16/22 1503   Procedure: LEFT L4-5 LAMINOFORAMINOTOMY (Left)   Anesthesia type: General   Pre-op diagnosis: M54.16 lumbar radiculopathy   Location: [facility] OR ROOM 03 / [place] [facility]   Surgeons: Meade Maw, MD   NOTE: Available PAT nursing documentation and vital signs have been reviewed. Clinical nursing staff has updated patient's PMH/PSHx, current medication list, and drug allergies/intolerances to ensure comprehensive history available to assist in medical decision making as it pertains to the aforementioned surgical procedure and anticipated anesthetic course. Extensive review of available clinical information performed. Cheyenne [name] PMH and PSHx updated with any diagnoses/procedures that  may have been inadvertently omitted during her intake with the pre-admission testing department's nursing staff.  Clinical Discussion:  Cheyenne [name] is a 88 y.o. female who is submitted for pre-surgical anesthesia review and clearance prior to her undergoing the above procedure. Patient has never been a smoker. 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  Patient is followed by cardiology Clayborn Bigness, MD). She was last seen in the cardiology clinic on 05/09/2022; notes reviewed.  At the time of her clinic visit, patient doing well overall from a cardiovascular perspective.  She denied any episodes of chest pain, PND, orthopnea, palpitations, significant peripheral  edema, vertiginous symptoms, or presyncope/syncope.  Patient continued to experience episodes of exertional dyspnea, however this symptom was noted to be stable and at baseline.  Patient also with complaints of significant back pain and describes it as being often "unbearable".  Patient with a past medical history significant for cardiovascular diagnoses.  Patient suffered an NSTEMI on 09/13/2021.    Patient with newly diagnosed HFrEF on 09/14/2021.  TTE at that time revealed a moderately reduced left ventricular systolic function with an EF of 35%.  There was global hypokinesis, in addition to septal/inferior/posterior akinesis.  There was mild left atrial enlargement.  There was trivial to mild pan valvular regurgitation.Left ventricular diastolic Doppler parameters consistent with pseudonormalization (G2DD).  GLS -9.1%.  There was no evidence of a significant transvalvular gradient to suggest stenosis.  Diagnostic LEFT heart catheterization was performed on 09/15/2021 revealing multivessel CAD; 95% mid RCA, 99% proximal LCx, and tandem 90% LAD and 70% D1 bifurcation stenoses.  PCI was performed placing a 2.75 x 30 mm Resolute Onyx Frontier DES to the proximal LCx lesion and a 3.5 x 15 mm Resolute Onyx Frontier DES to the mid RCA lesion.  Procedure resulted and excellent angiographic result and TIMI-3 flow.  Plans were for staged PCI procedure for revascularization of the LAD.  Patient underwent staged PCI procedure on 09/19/2021.  PTCA, and subsequent DES x 1 placement (unknown type) was performed to the mid LAD.  Procedure yielded excellent angiographic result and TIMI-3 flow.  Most recent TTE was performed on 02/14/2022 revealing a normal left ventricular systolic function with an EF of 60-65%.  There were no regional wall motion abnormalities. Left ventricular diastolic Doppler parameters consistent with abnormal relaxation (G1DD).  Right ventricular size and function normal.  There was mild to  moderate mitral valve regurgitation.  All transvalvular gradients were  normal suggesting no evidence of stenosis.  Patient with an atrial fibrillation diagnosis; CHA2DS2-VASc Score = 6 (age x 2, sex, HFrEF, HTN, NSTEMI). Her rate and rhythm are currently being maintained on oral metoprolol succinate.  Given significant CAD resulting in placement of multiple stents, PVD, and concurrent atrial fibrillation, patient is on both anticoagulation (apixaban) and antiplatelet (clopidogrel) therapies.  Patient reportedly compliant with interventions with no evidence of or reports GI bleeding.  Blood pressure well controlled at 102/64 mmHg on currently prescribed ACEi (lisinopril), beta-blocker (metoprolol succinate), and diuretic (torsemide) therapies.  Patient is not currently taking any type of lipid-lowering therapies for her HLD diagnosis or further ASCVD prevention.  She has a supply of short acting nitrates (NTG) to use on a as needed basis for recurrent anginal symptoms; denied recent use.  Patient also on an SGLT2i (empagliflozin) for management of her HFrEF diagnosis in the setting of stage IV CKD. She is not diabetic. Patient does not have an OSAH diagnosis.  Functional capacity limited by age, debilitating back pain/arthritides, and multiple medical comorbidities.  Patient questionably able to achieve 4 METS of physical activity without experiencing any degree of angina/anginal equivalent symptoms.  No changes were made to her medication regimen.  Patient to follow-up with outpatient cardiology in 3 months or sooner if needed.  Cheyenne [name] is scheduled for a LEFT L4-5 LAMINOFORAMINOTOMY on 07/16/2022 with Dr. Meade Maw, MD.  Given patient's past medical history significant for cardiovascular and multiple medical comorbid diagnoses, presurgical clearances were sought from patient's cardiologist and primary care provider.  Per PCP Michaelle Copas, NP-C), "patient is aware of the risk involved with  surgery, but her statement is that she is okay with the risk to be out of pain.  Due to age and atrial fibrillation diagnosis, patient is felt to be at a HIGH risk for the planned surgical intervention.  Patient is optimized for the procedure and may proceed to surgery".  Per cardiology, "this patient is optimized for surgery and may proceed with the planned procedural course with a LOW risk of significant perioperative cardiovascular complications".  As previously mentioned, patient is currently on prescribed daily anticoagulation and antiplatelet therapies.  She has been instructed on recommendations from cardiology for holding her apixaban for 3 days (last dose 07/12/2022) and her clopidogrel for 7 days (last dose 07/08/2022) prior to her procedure with plans to restart both medications 14 days postoperatively, or when bleeding risk felt to be minimized by her primary attending surgeon.  Patient denies previous perioperative complications with anesthesia in the past.  In review her EMR, there are no records available for review pertaining to past procedural/anesthetic courses within the [facility] system.     07/03/2022    2:40 PM 06/21/2022   11:26 AM 02/15/2022    7:33 AM  Vitals with BMI  Height '5\' 1"'$  '5\' 1"'$    Weight  128 lbs 13 oz   BMI  20.94   Systolic 709 628 366  Diastolic 73 64 83  Pulse 65  79    Providers/Specialists:   NOTE: Primary physician provider listed below. Patient may have been seen by APP or partner within same practice.   PROVIDER ROLE / SPECIALTY LAST Dola Factor, MD Neurosurgery (Surgeon) 06/21/2022  Latanya Maudlin, NP Primary Care Provider 05/16/2022  Katrine Coho, MD Cardiology 05/09/2022  Estevan Oaks, NP-C Palliative Care 07/10/2022   Allergies:  Alendronate, Levofloxacin, Codeine, and Propoxyphene  Current Home Medications:   No current facility-administered medications for  this encounter.    acetaminophen (TYLENOL) 500 MG  tablet   cetirizine (ZYRTEC) 10 MG tablet   Cholecalciferol (VITAMIN D3 PO)   clopidogrel (PLAVIX) 75 MG tablet   cyanocobalamin 1000 MCG tablet   ELIQUIS 2.5 MG TABS tablet   HYDROcodone-acetaminophen (NORCO/VICODIN) 5-325 MG tablet   JARDIANCE 10 MG TABS tablet   lisinopril (ZESTRIL) 2.5 MG tablet   metoprolol succinate (TOPROL-XL) 25 MG 24 hr tablet   naloxone (NARCAN) nasal spray 4 mg/0.1 mL   nitroGLYCERIN (NITROSTAT) 0.4 MG SL tablet   pantoprazole (PROTONIX) 40 MG tablet   pregabalin (LYRICA) 150 MG capsule   torsemide (DEMADEX) 20 MG tablet   History:   Past Medical History:  Diagnosis Date   A-fib ([place])    a.) CHA2DS2VASc = 6 (age x 2, sex, HFrEF, HTN, NSTEMI);  b.) rate/rhythm maintained on oral metoprolol succinate; chronically anticoagulated with apixaban + clopidogrel   Allergic rhinitis    Anemia    Anxiety    Arthritis    Asthma    Breast cancer, right ([place]) 1995   a.) s/p surgical resection (mastectomy) + chemotherapy + 5 years endocrine therapy (tamoxifen)   CAD (coronary artery disease)    a.) LHC 09/15/2021: 90% mLAD, 70% D1, 99% pLCx, 95% mRCA.   Carpal tunnel syndrome    Cervical radiculopathy due to degenerative joint disease of spine    Chronic kidney disease (CKD), stage IV (severe) (HCC)    Chronic use of opiate for therapeutic purpose    a.) has naloxone Rx in place   Depression    Diverticulitis    Diverticulosis    DOE (dyspnea on exertion)    Followed by palliative care service    GERD (gastroesophageal reflux disease)    H. pylori infection 06/2005   H/O bilateral cataract extraction 12/2005   HFrEF (heart failure with reduced ejection fraction) ([place]) 09/14/2021   a.) newly Dx'd. EF 35%, glob HK, sep/inf/post AK, mild LAE, triv AR/PR, mild MR/TR, GLS -9.1%, G2DD; b.) TTE 02/14/2022: EF 60-65%, mild-mod MR, G1DD   History of spinal fracture 1990   a.) T7 and T12 fractures - traumatic sequela of a mechanical fall   HLD (hyperlipidemia)     HTN (hypertension)    Impaired glucose tolerance    LBBB (left bundle branch block)    Long term current use of anticoagulant    a.) dose reduced apixaban   Long term current use of antithrombotics/antiplatelets    a.) clopidogrel   Lumbago    Lumbar radiculopathy    Macular degeneration    NSTEMI (non-ST elevated myocardial infarction) ([place]) 09/13/2021   a.) LHC/PCI 09/15/2021: 90% mLAD (plans for staged PCI), 70% D1, 99% pLCx (2.75 x 30 mm Resolute Onyx Frontier DES), 95% mRCA (3.5 x 15 mm Resolute Onyx Frontier DES); b.) staged PCI 09/19/2021 - PTCA/DES x1 (unknown type) to mLAD   Osteoporosis    Ovarian cyst    Paresthesia of both hands    Peripheral edema    PVD (peripheral vascular disease) ([place])    Vitamin D deficiency    Past Surgical History:  Procedure Laterality Date   ABDOMINAL HYSTERECTOMY  1972   CARPAL TUNNEL RELEASE Right 08/10/2019   CATARACT EXTRACTION Bilateral 2007   COLONOSCOPY     CORONARY ANGIOPLASTY WITH STENT PLACEMENT Left 09/15/2021   Procedure: CORONARY ANGIOPLASTY WITH STENT PLACEMENT; Location: [facility]; Surgeon: Glynn Octave, MD   CORONARY ANGIOPLASTY WITH STENT PLACEMENT Left 09/19/2021   Procedure: CORONARY ANGIOPLASTY WITH STENT  PLACEMENT; Location: [facility]; Surgeon: Varney Biles, MD   MASTECTOMY Right 1999   SIGMOIDOSCOPY  2004   UPPER GASTROINTESTINAL ENDOSCOPY  2006   No family history on file. Social History   Tobacco Use   Smoking status: Never   Smokeless tobacco: Never  Vaping Use   Vaping Use: Never used  Substance Use Topics   Alcohol use: Never   Drug use: Never    Pertinent Clinical Results:  LABS: Labs reviewed: Acceptable for surgery.  Component Date Value Ref Range Status   MRSA, PCR 07/03/2022 NEGATIVE  NEGATIVE Final   Staphylococcus aureus 07/03/2022 NEGATIVE  NEGATIVE Final   Comment: (NOTE) The Xpert SA Assay (FDA approved for NASAL specimens in patients 15 years of age and older), is one component of a  comprehensive surveillance program. It is not intended to diagnose infection nor to guide or monitor treatment. Performed at [facility], [address] [place], [place] [address]    ABO/RH(D) 07/03/2022 A POS   Final   Antibody Screen 07/03/2022 NEG   Final   Sample Expiration 07/03/2022 07/17/2022,2359   Final   Extend sample reason 07/03/2022    Final                   Value:NO TRANSFUSIONS OR PREGNANCY IN THE PAST 3 MONTHS Performed at [facility], [address] [place], [place] [address]    Color, Urine 07/03/2022 STRAW (A)  YELLOW Final   APPearance 07/03/2022 CLEAR (A)  CLEAR Final   Specific Gravity, Urine 07/03/2022 1.006  1.005 - 1.030 Final   pH 07/03/2022 5.0  5.0 - 8.0 Final   Glucose, UA 07/03/2022 50 (A)  NEGATIVE mg/dL Final   Hgb urine dipstick 07/03/2022 NEGATIVE  NEGATIVE Final   Bilirubin Urine 07/03/2022 NEGATIVE  NEGATIVE Final   Ketones, ur 07/03/2022 NEGATIVE  NEGATIVE mg/dL Final   Protein, ur 07/03/2022 NEGATIVE  NEGATIVE mg/dL Final   Nitrite 07/03/2022 NEGATIVE  NEGATIVE Final   Leukocytes,Ua 07/03/2022 NEGATIVE  NEGATIVE Final   RBC / HPF 07/03/2022 0-5  0 - 5 RBC/hpf Final   WBC, UA 07/03/2022 NONE SEEN  0 - 5 WBC/hpf Final   Bacteria, UA 07/03/2022 NONE SEEN  NONE SEEN Final   Squamous Epithelial / LPF 07/03/2022 NONE SEEN  0 - 5 Final   Hyaline Casts, UA 07/03/2022 PRESENT   Final   Performed at [facility], [address] [place], [place] [address]       ECG: Date: 07/03/2022 Time ECG obtained: 1314 PM Rate: 58 bpm Rhythm: sinus bradycardia; LBBB Axis (leads I and aVF): Left axis deviation Intervals: PR 190 ms. QRS 136 ms. QTc 471 ms. ST segment and T wave changes: Lateral T wave inversions less evident Comparison: Similar to previous tracing obtained on 02/13/2022   IMAGING / PROCEDURES: CT ABDOMEN PELVIS W IV CONTRAST ONLY performed on 05/19/2022 No acute findings within the abdomen or  pelvis. Left adnexal cystic lesion measuring up to 5.6 cm, indeterminate. Nonemergent endovaginal ultrasound or MRI of the pelvis with and without contrast may provide more definitive characterization.  0.8 cm left lower lobe solid pulmonary nodule, indeterminate however may be infectious or inflammatory in etiology. Recommend follow-up CT of the chest in 8-12 weeks to assess for stability and/or clearance.   TRANSTHORACIC ECHOCARDIOGRAM performed on 02/14/2022 Left ventricular ejection fraction, by estimation, is 60 to 65%. The left ventricle has normal function. The left ventricle has no regional wall motion abnormalities. Left ventricular  diastolic parameters are consistent with Grade I diastolic dysfunction (impaired relaxation).  Right ventricular systolic function is normal. The right ventricular size is normal.  The mitral valve is normal in structure. Mild to moderate mitral valve regurgitation. No evidence of mitral stenosis.  The aortic valve is normal in structure. Aortic valve regurgitation is not visualized. No aortic stenosis is present.  The inferior vena cava is normal in size with greater than 50% respiratory variability, suggesting right atrial pressure of 3 mmHg.   LEFT HEART CATHETERIZATION AND CORONARY ANGIOGRAPHY performed on 09/15/2021 and 09/19/2021 Severe 95% mid RCA stenosis Resolute Onyx Frontier 3.5 x 15 mm DES-deployed at high pressure and post-dilated on 09/15/2021 Severe 99% proximal LCx stenosis,  confirmed with IVUS Resolute Onyx Frontier 2.75 x 30 mm DES-deployed at high pressure and post-dilated on 09/15/2021 Severe 90% LAD and D1 bifurcation stenosis Successful staged PTCA/DES x 1 to mid LAD with final kissing balloon inflation with D2 on 09/19/2021.  Final TIMI 3 flow in the LAD/D2.  Post-IVUS revealed apposed stent (except for proximal segment of stent which was further post-dilated with a 3.5 Oden balloon to high pressure).  Post-IVUS revealed a non-obstructive  lesion in the ostial LAD (MLA 6.63m2 without any edge dissection).   Normal LVEDP on left heart cath, LVEDP 10 mm Hg  Recommendations:  Risk factor modification for CAD and CAD risk factors  Routine post-PCI care-plan for revascularization of the LAD/D1 prior to discharge  Refer for cardiac rehab  Aspirin 81 mg lifelong, P2Y12 inhibitor for at least 6 months  Avoid elective surgery while receiving a P2Y12 inhibitor Follow up with outpatient cardio in ~4 weeks.   Impression and Plan:  Cheyenne SLAWSONhas been referred for pre-anesthesia review and clearance prior to her undergoing the planned anesthetic and procedural courses. Available labs, pertinent testing, and imaging results were personally reviewed by me. This patient has been appropriately cleared by family/internal medicine (HIGH) and by cardiology (LOW) with the individually indicated risk of significant perioperative complications.  Based on clinical review performed today (07/13/22), barring any significant acute changes in the patient's overall condition, it is anticipated that she will be able to proceed with the planned surgical intervention. Any acute changes in clinical condition may necessitate her procedure being postponed and/or cancelled. Patient will meet with anesthesia team (MD and/or CRNA) on the day of her procedure for preoperative evaluation/assessment. Questions regarding anesthetic course will be fielded at that time.   Pre-surgical instructions were reviewed with the patient during her PAT appointment and questions were fielded by PAT clinical staff. Patient was advised that if any questions or concerns arise prior to her procedure then she should return a call to PAT and/or her surgeon's office to discuss.  BHonor Loh MSN, APRN, FNP-C, CEN [facility] Peri-operative Services Nurse Practitioner Phone: [phone]: ([phone]/27/23 2:37 PM  NOTE: This note has been prepared  using Dragon dictation software. Despite my best ability to proofread, there is always the potential that unintentional transcriptional errors may still occur from this process.

## 2022-07-13 ENCOUNTER — Encounter: Payer: Self-pay | Admitting: Neurosurgery

## 2022-07-16 ENCOUNTER — Encounter: Admission: RE | Payer: Self-pay | Source: Home / Self Care

## 2022-07-16 ENCOUNTER — Ambulatory Visit: Admission: RE | Admit: 2022-07-16 | Payer: Medicare HMO | Source: Home / Self Care | Admitting: Neurosurgery

## 2022-07-16 ENCOUNTER — Telehealth: Payer: Self-pay

## 2022-07-16 ENCOUNTER — Other Ambulatory Visit: Payer: Self-pay

## 2022-07-16 HISTORY — DX: Encounter for palliative care: Z51.5

## 2022-07-16 HISTORY — DX: Other forms of dyspnea: R06.09

## 2022-07-16 HISTORY — DX: Localized edema: R60.0

## 2022-07-16 HISTORY — DX: Essential (primary) hypertension: I10

## 2022-07-16 HISTORY — DX: Other spondylosis with radiculopathy, cervical region: M47.22

## 2022-07-16 HISTORY — DX: Long term (current) use of opiate analgesic: Z79.891

## 2022-07-16 HISTORY — DX: Left bundle-branch block, unspecified: I44.7

## 2022-07-16 HISTORY — DX: Paresthesia of skin: R20.2

## 2022-07-16 HISTORY — DX: Long term (current) use of anticoagulants: Z79.01

## 2022-07-16 HISTORY — DX: Hyperlipidemia, unspecified: E78.5

## 2022-07-16 HISTORY — DX: Long term (current) use of antithrombotics/antiplatelets: Z79.02

## 2022-07-16 HISTORY — DX: Edema, unspecified: R60.9

## 2022-07-16 HISTORY — DX: Radiculopathy, lumbar region: M54.16

## 2022-07-16 SURGERY — LUMBAR LAMINECTOMY/DECOMPRESSION MICRODISCECTOMY 1 LEVEL
Anesthesia: General | Laterality: Left

## 2022-07-16 NOTE — Telephone Encounter (Signed)
I spoke with Cheyenne [name] (pt's sister) to reschedule Cheyenne [name]'s surgery due to Dr Izora Ribas being ill today. We have rescheduled surgery to 07/25/22. I reminded them regarding the eliquis, plavix, and jardiance. Post op appointments were moved accordingly.    Eliquis:   stop Eliquis 72 hours prior, resume Eliquis 14 days after   Plavix:  stop Plavix 7 days prior, resume Plavix 14 days after  Jardiance - hold for 3 days prior to surgery

## 2022-07-20 ENCOUNTER — Encounter
Admission: RE | Admit: 2022-07-20 | Discharge: 2022-07-20 | Disposition: A | Discharge status: Home / Self Care | Payer: Medicare HMO | Source: Ambulatory Visit | Attending: Neurosurgery | Admitting: Neurosurgery

## 2022-07-20 DIAGNOSIS — Z01812 Encounter for preprocedural laboratory examination: Secondary | ICD-10-CM

## 2022-07-20 NOTE — Pre-Procedure Instructions (Signed)
Spoke with patients daughter Cheyenne [name] to follow up on patients upcoming surgery scheduled with Dr. Izora Ribas, no changes in medications, HX or allergies, daughter understands instructions and has no questions at this time.

## 2022-07-24 MED ORDER — CEFAZOLIN SODIUM-DEXTROSE 2-4 GM/100ML-% IV SOLN
2.0000 g | INTRAVENOUS | Status: AC
  Administered 2022-07-25: 2 g via INTRAVENOUS

## 2022-07-24 MED ORDER — CHLORHEXIDINE GLUCONATE 0.12 % MT SOLN
15.0000 mL | Freq: Once | OROMUCOSAL | Status: AC
  Administered 2022-07-25: 15 mL via OROMUCOSAL

## 2022-07-24 MED ORDER — CEFAZOLIN IN SODIUM CHLORIDE 2-0.9 GM/100ML-% IV SOLN
2.0000 g | Freq: Once | INTRAVENOUS | Status: DC
  Filled 2022-07-24: qty 100

## 2022-07-24 MED ORDER — LACTATED RINGERS IV SOLN: INTRAVENOUS | Status: DC

## 2022-07-24 MED ORDER — ORAL CARE MOUTH RINSE: 15.0000 mL | Freq: Once | OROMUCOSAL | Status: AC

## 2022-07-25 ENCOUNTER — Observation Stay
Admission: RE | Admit: 2022-07-25 | Discharge: 2022-07-26 | Disposition: A | Discharge status: Home / Self Care | Payer: Medicare HMO | Attending: Neurosurgery | Admitting: Neurosurgery

## 2022-07-25 ENCOUNTER — Encounter
Admission: RE | Disposition: A | Discharge status: Home / Self Care | Payer: Self-pay | Source: Home / Self Care | Attending: Neurosurgery

## 2022-07-25 ENCOUNTER — Ambulatory Visit: Payer: Medicare HMO

## 2022-07-25 ENCOUNTER — Other Ambulatory Visit: Payer: Self-pay

## 2022-07-25 ENCOUNTER — Ambulatory Visit: Payer: Medicare HMO | Admitting: Urgent Care

## 2022-07-25 ENCOUNTER — Encounter: Payer: Self-pay | Admitting: Neurosurgery

## 2022-07-25 DIAGNOSIS — J45909 Unspecified asthma, uncomplicated: Secondary | ICD-10-CM | POA: Diagnosis not present

## 2022-07-25 DIAGNOSIS — I5020 Unspecified systolic (congestive) heart failure: Secondary | ICD-10-CM | POA: Insufficient documentation

## 2022-07-25 DIAGNOSIS — I130 Hypertensive heart and chronic kidney disease with heart failure and stage 1 through stage 4 chronic kidney disease, or unspecified chronic kidney disease: Secondary | ICD-10-CM | POA: Diagnosis not present

## 2022-07-25 DIAGNOSIS — N184 Chronic kidney disease, stage 4 (severe): Secondary | ICD-10-CM | POA: Insufficient documentation

## 2022-07-25 DIAGNOSIS — Z01818 Encounter for other preprocedural examination: Secondary | ICD-10-CM

## 2022-07-25 DIAGNOSIS — M4316 Spondylolisthesis, lumbar region: Secondary | ICD-10-CM | POA: Diagnosis not present

## 2022-07-25 DIAGNOSIS — Z7902 Long term (current) use of antithrombotics/antiplatelets: Secondary | ICD-10-CM | POA: Insufficient documentation

## 2022-07-25 DIAGNOSIS — I4891 Unspecified atrial fibrillation: Secondary | ICD-10-CM | POA: Diagnosis not present

## 2022-07-25 DIAGNOSIS — I2510 Atherosclerotic heart disease of native coronary artery without angina pectoris: Secondary | ICD-10-CM | POA: Diagnosis not present

## 2022-07-25 DIAGNOSIS — Z79899 Other long term (current) drug therapy: Secondary | ICD-10-CM | POA: Insufficient documentation

## 2022-07-25 DIAGNOSIS — M5416 Radiculopathy, lumbar region: Secondary | ICD-10-CM | POA: Diagnosis present

## 2022-07-25 DIAGNOSIS — Z01812 Encounter for preprocedural laboratory examination: Secondary | ICD-10-CM

## 2022-07-25 DIAGNOSIS — Z853 Personal history of malignant neoplasm of breast: Secondary | ICD-10-CM | POA: Insufficient documentation

## 2022-07-25 DIAGNOSIS — Z955 Presence of coronary angioplasty implant and graft: Secondary | ICD-10-CM | POA: Insufficient documentation

## 2022-07-25 DIAGNOSIS — Z7901 Long term (current) use of anticoagulants: Secondary | ICD-10-CM | POA: Insufficient documentation

## 2022-07-25 HISTORY — PX: LUMBAR LAMINECTOMY/DECOMPRESSION MICRODISCECTOMY: SHX5026

## 2022-07-25 LAB — APTT: aPTT: 20 seconds — ABNORMAL LOW (ref 24–36)

## 2022-07-25 LAB — TYPE AND SCREEN
ABO/RH(D): A POS
Antibody Screen: NEGATIVE

## 2022-07-25 LAB — PROTIME-INR
INR: 0.9 (ref 0.8–1.2)
Prothrombin Time: 12.5 seconds (ref 11.4–15.2)

## 2022-07-25 SURGERY — LUMBAR LAMINECTOMY/DECOMPRESSION MICRODISCECTOMY 1 LEVEL
Anesthesia: General | Site: Spine Lumbar | Laterality: Left

## 2022-07-25 MED ORDER — LISINOPRIL 5 MG PO TABS
2.5000 mg | ORAL_TABLET | Freq: Every day | ORAL | Status: DC
  Administered 2022-07-25 – 2022-07-26 (×2): 2.5 mg via ORAL
  Filled 2022-07-25 (×2): qty 1

## 2022-07-25 MED ORDER — MENTHOL 3 MG MT LOZG: 1.0000 | LOZENGE | OROMUCOSAL | Status: DC | PRN

## 2022-07-25 MED ORDER — PROPOFOL 10 MG/ML IV BOLUS
INTRAVENOUS | Status: DC | PRN
  Administered 2022-07-25: 70 mg via INTRAVENOUS

## 2022-07-25 MED ORDER — METHYLPREDNISOLONE ACETATE 40 MG/ML IJ SUSP
INTRAMUSCULAR | Status: DC | PRN
  Administered 2022-07-25: 40 mg

## 2022-07-25 MED ORDER — ACETAMINOPHEN 650 MG RE SUPP: 650.0000 mg | RECTAL | Status: DC | PRN

## 2022-07-25 MED ORDER — METHYLPREDNISOLONE ACETATE 40 MG/ML IJ SUSP
INTRAMUSCULAR | Status: AC
  Filled 2022-07-25: qty 1

## 2022-07-25 MED ORDER — HYDROCODONE-ACETAMINOPHEN 5-325 MG PO TABS: 2.0000 | ORAL_TABLET | ORAL | Status: DC | PRN

## 2022-07-25 MED ORDER — PROPOFOL 1000 MG/100ML IV EMUL
INTRAVENOUS | Status: AC
  Filled 2022-07-25: qty 100

## 2022-07-25 MED ORDER — ACETAMINOPHEN 10 MG/ML IV SOLN
INTRAVENOUS | Status: DC | PRN
  Administered 2022-07-25: 1000 mg via INTRAVENOUS

## 2022-07-25 MED ORDER — ONDANSETRON HCL 4 MG/2ML IJ SOLN
INTRAMUSCULAR | Status: DC | PRN
  Administered 2022-07-25: 4 mg via INTRAVENOUS

## 2022-07-25 MED ORDER — FENTANYL CITRATE (PF) 100 MCG/2ML IJ SOLN
INTRAMUSCULAR | Status: DC | PRN
  Administered 2022-07-25 (×2): 25 ug via INTRAVENOUS

## 2022-07-25 MED ORDER — SUCCINYLCHOLINE CHLORIDE 200 MG/10ML IV SOSY
PREFILLED_SYRINGE | INTRAVENOUS | Status: DC | PRN
  Administered 2022-07-25: 100 mg via INTRAVENOUS

## 2022-07-25 MED ORDER — ACETAMINOPHEN 10 MG/ML IV SOLN: 1000.0000 mg | Freq: Once | INTRAVENOUS | Status: DC | PRN

## 2022-07-25 MED ORDER — SODIUM CHLORIDE 0.9% FLUSH: 3.0000 mL | INTRAVENOUS | Status: DC | PRN

## 2022-07-25 MED ORDER — BUPIVACAINE-EPINEPHRINE (PF) 0.5% -1:200000 IJ SOLN
INTRAMUSCULAR | Status: AC
  Filled 2022-07-25: qty 30

## 2022-07-25 MED ORDER — PREGABALIN 75 MG PO CAPS
150.0000 mg | ORAL_CAPSULE | Freq: Two times a day (BID) | ORAL | Status: DC
  Administered 2022-07-25 – 2022-07-26 (×2): 150 mg via ORAL
  Filled 2022-07-25 (×3): qty 2

## 2022-07-25 MED ORDER — ENOXAPARIN SODIUM 30 MG/0.3ML IJ SOSY
30.0000 mg | PREFILLED_SYRINGE | INTRAMUSCULAR | Status: DC
  Administered 2022-07-26: 30 mg via SUBCUTANEOUS
  Filled 2022-07-25: qty 0.3

## 2022-07-25 MED ORDER — 0.9 % SODIUM CHLORIDE (POUR BTL) OPTIME
TOPICAL | Status: DC | PRN
  Administered 2022-07-25: 500 mL

## 2022-07-25 MED ORDER — PROPOFOL 10 MG/ML IV BOLUS
INTRAVENOUS | Status: AC
  Filled 2022-07-25: qty 40

## 2022-07-25 MED ORDER — SUCCINYLCHOLINE CHLORIDE 200 MG/10ML IV SOSY
PREFILLED_SYRINGE | INTRAVENOUS | Status: AC
  Filled 2022-07-25: qty 10

## 2022-07-25 MED ORDER — FENTANYL CITRATE (PF) 100 MCG/2ML IJ SOLN: 25.0000 ug | INTRAMUSCULAR | Status: DC | PRN

## 2022-07-25 MED ORDER — ACETAMINOPHEN 325 MG PO TABS: 650.0000 mg | ORAL_TABLET | ORAL | Status: DC | PRN

## 2022-07-25 MED ORDER — SENNA 8.6 MG PO TABS
1.0000 | ORAL_TABLET | Freq: Two times a day (BID) | ORAL | Status: DC
  Administered 2022-07-25 – 2022-07-26 (×3): 8.6 mg via ORAL
  Filled 2022-07-25 (×3): qty 1

## 2022-07-25 MED ORDER — HYDROCODONE-ACETAMINOPHEN 5-325 MG PO TABS: 1.0000 | ORAL_TABLET | ORAL | Status: DC | PRN

## 2022-07-25 MED ORDER — METHOCARBAMOL 500 MG PO TABS: 500.0000 mg | ORAL_TABLET | Freq: Four times a day (QID) | ORAL | Status: DC | PRN

## 2022-07-25 MED ORDER — CHLORHEXIDINE GLUCONATE 0.12 % MT SOLN
OROMUCOSAL | Status: AC
  Filled 2022-07-25: qty 15

## 2022-07-25 MED ORDER — ONDANSETRON HCL 4 MG/2ML IJ SOLN
INTRAMUSCULAR | Status: AC
  Filled 2022-07-25: qty 2

## 2022-07-25 MED ORDER — BUPIVACAINE LIPOSOME 1.3 % IJ SUSP
INTRAMUSCULAR | Status: AC
  Filled 2022-07-25: qty 20

## 2022-07-25 MED ORDER — HYDROMORPHONE HCL 1 MG/ML IJ SOLN: 0.5000 mg | INTRAMUSCULAR | Status: DC | PRN

## 2022-07-25 MED ORDER — SODIUM CHLORIDE FLUSH 0.9 % IV SOLN
INTRAVENOUS | Status: AC
  Filled 2022-07-25: qty 20

## 2022-07-25 MED ORDER — BUPIVACAINE-EPINEPHRINE (PF) 0.5% -1:200000 IJ SOLN
INTRAMUSCULAR | Status: DC | PRN
  Administered 2022-07-25: 3 mL via PERINEURAL

## 2022-07-25 MED ORDER — EPHEDRINE SULFATE (PRESSORS) 50 MG/ML IJ SOLN
INTRAMUSCULAR | Status: DC | PRN
  Administered 2022-07-25 (×4): 5 mg via INTRAVENOUS

## 2022-07-25 MED ORDER — SODIUM CHLORIDE (PF) 0.9 % IJ SOLN
INTRAMUSCULAR | Status: DC | PRN
  Administered 2022-07-25: 60 mL via INTRAMUSCULAR

## 2022-07-25 MED ORDER — BUPIVACAINE HCL (PF) 0.5 % IJ SOLN
INTRAMUSCULAR | Status: AC
  Filled 2022-07-25: qty 30

## 2022-07-25 MED ORDER — FENTANYL CITRATE (PF) 100 MCG/2ML IJ SOLN
INTRAMUSCULAR | Status: AC
  Filled 2022-07-25: qty 2

## 2022-07-25 MED ORDER — CEFAZOLIN SODIUM-DEXTROSE 2-4 GM/100ML-% IV SOLN
INTRAVENOUS | Status: AC
  Filled 2022-07-25: qty 100

## 2022-07-25 MED ORDER — VASOPRESSIN 20 UNIT/ML IV SOLN
INTRAVENOUS | Status: DC | PRN
  Administered 2022-07-25: 1 [IU] via INTRAVENOUS

## 2022-07-25 MED ORDER — METHOCARBAMOL 1000 MG/10ML IJ SOLN: 500.0000 mg | Freq: Four times a day (QID) | INTRAVENOUS | Status: DC | PRN

## 2022-07-25 MED ORDER — OXYCODONE HCL 5 MG PO TABS: 5.0000 mg | ORAL_TABLET | Freq: Once | ORAL | Status: DC | PRN

## 2022-07-25 MED ORDER — SODIUM CHLORIDE 0.9% FLUSH
3.0000 mL | Freq: Two times a day (BID) | INTRAVENOUS | Status: DC
  Administered 2022-07-25 – 2022-07-26 (×2): 3 mL via INTRAVENOUS

## 2022-07-25 MED ORDER — DEXAMETHASONE SODIUM PHOSPHATE 10 MG/ML IJ SOLN
INTRAMUSCULAR | Status: AC
  Filled 2022-07-25: qty 1

## 2022-07-25 MED ORDER — OXYCODONE HCL 5 MG/5ML PO SOLN: 5.0000 mg | Freq: Once | ORAL | Status: DC | PRN

## 2022-07-25 MED ORDER — DEXAMETHASONE SODIUM PHOSPHATE 10 MG/ML IJ SOLN
INTRAMUSCULAR | Status: DC | PRN
  Administered 2022-07-25: 5 mg via INTRAVENOUS

## 2022-07-25 MED ORDER — PHENYLEPHRINE 80 MCG/ML (10ML) SYRINGE FOR IV PUSH (FOR BLOOD PRESSURE SUPPORT)
PREFILLED_SYRINGE | INTRAVENOUS | Status: DC | PRN
  Administered 2022-07-25: 80 ug via INTRAVENOUS

## 2022-07-25 MED ORDER — LIDOCAINE HCL (CARDIAC) PF 100 MG/5ML IV SOSY
PREFILLED_SYRINGE | INTRAVENOUS | Status: DC | PRN
  Administered 2022-07-25: 100 mg via INTRAVENOUS

## 2022-07-25 MED ORDER — TORSEMIDE 20 MG PO TABS
20.0000 mg | ORAL_TABLET | Freq: Every day | ORAL | Status: DC
  Administered 2022-07-25 – 2022-07-26 (×2): 20 mg via ORAL
  Filled 2022-07-25 (×2): qty 1

## 2022-07-25 MED ORDER — ACETAMINOPHEN 10 MG/ML IV SOLN
INTRAVENOUS | Status: AC
  Filled 2022-07-25: qty 100

## 2022-07-25 MED ORDER — METOPROLOL SUCCINATE ER 25 MG PO TB24
25.0000 mg | ORAL_TABLET | Freq: Every day | ORAL | Status: DC
  Administered 2022-07-25 – 2022-07-26 (×2): 25 mg via ORAL
  Filled 2022-07-25 (×2): qty 1

## 2022-07-25 MED ORDER — NITROGLYCERIN 0.4 MG SL SUBL: 0.4000 mg | SUBLINGUAL_TABLET | SUBLINGUAL | Status: DC | PRN

## 2022-07-25 MED ORDER — VASOPRESSIN 20 UNIT/ML IV SOLN
INTRAVENOUS | Status: AC
  Filled 2022-07-25: qty 1

## 2022-07-25 MED ORDER — SODIUM CHLORIDE 0.9 % IV SOLN: INTRAVENOUS | Status: DC

## 2022-07-25 MED ORDER — SODIUM CHLORIDE 0.9 % IV SOLN: 250.0000 mL | INTRAVENOUS | Status: DC

## 2022-07-25 MED ORDER — PHENOL 1.4 % MT LIQD: 1.0000 | OROMUCOSAL | Status: DC | PRN

## 2022-07-25 MED ORDER — SURGIFLO WITH THROMBIN (HEMOSTATIC MATRIX KIT) OPTIME
TOPICAL | Status: DC | PRN
  Administered 2022-07-25: 1 via TOPICAL

## 2022-07-25 MED ORDER — GLYCOPYRROLATE 0.2 MG/ML IJ SOLN
INTRAMUSCULAR | Status: DC | PRN
  Administered 2022-07-25: .1 mg via INTRAVENOUS

## 2022-07-25 SURGICAL SUPPLY — 52 items
BASIN KIT SINGLE STR (MISCELLANEOUS) ×1 IMPLANT
BUR NEURO DRILL SOFT 3.0X3.8M (BURR) ×1 IMPLANT
CHLORAPREP W/TINT 26 (MISCELLANEOUS) ×1 IMPLANT
CNTNR SPEC 2.5X3XGRAD LEK (MISCELLANEOUS) ×1
CONT SPEC 4OZ STER OR WHT (MISCELLANEOUS) ×1
CONTAINER SPEC 2.5X3XGRAD LEK (MISCELLANEOUS) ×1 IMPLANT
DERMABOND ADVANCED .7 DNX12 (GAUZE/BANDAGES/DRESSINGS) ×1 IMPLANT
DRAPE C ARM PK CFD 31 SPINE (DRAPES) ×1 IMPLANT
DRAPE LAPAROTOMY 100X77 ABD (DRAPES) ×1 IMPLANT
DRAPE MICROSCOPE SPINE 48X150 (DRAPES) ×1 IMPLANT
DRAPE SURG 17X11 SM STRL (DRAPES) ×1 IMPLANT
DRSG OPSITE POSTOP 3X4 (GAUZE/BANDAGES/DRESSINGS) IMPLANT
DRSG TEGADERM 2-3/8X2-3/4 SM (GAUZE/BANDAGES/DRESSINGS) IMPLANT
ELECT EZSTD 165MM 6.5IN (MISCELLANEOUS) ×1
ELECT REM PT RETURN 9FT ADLT (ELECTROSURGICAL) ×1
ELECTRODE EZSTD 165MM 6.5IN (MISCELLANEOUS) IMPLANT
ELECTRODE REM PT RTRN 9FT ADLT (ELECTROSURGICAL) ×1 IMPLANT
GLOVE BIOGEL PI IND STRL 6.5 (GLOVE) ×1 IMPLANT
GLOVE BIOGEL PI IND STRL 8.5 (GLOVE) ×1 IMPLANT
GLOVE SURG SYN 6.5 ES PF (GLOVE) ×2 IMPLANT
GLOVE SURG SYN 6.5 PF PI (GLOVE) ×2 IMPLANT
GLOVE SURG SYN 8.5  E (GLOVE) ×3
GLOVE SURG SYN 8.5 E (GLOVE) ×3 IMPLANT
GLOVE SURG SYN 8.5 PF PI (GLOVE) ×3 IMPLANT
GOWN SRG LRG LVL 4 IMPRV REINF (GOWNS) ×1 IMPLANT
GOWN SRG XL LVL 3 NONREINFORCE (GOWNS) ×1 IMPLANT
GOWN STRL NON-REIN TWL XL LVL3 (GOWNS) ×1
GOWN STRL REIN LRG LVL4 (GOWNS) ×1
GRAFT DURAGEN MATRIX 1WX1L (Tissue) IMPLANT
HEMOVAC 400CC 10FR (MISCELLANEOUS) IMPLANT
KIT SPINAL PRONEVIEW (KITS) ×1 IMPLANT
MANIFOLD NEPTUNE II (INSTRUMENTS) ×1 IMPLANT
MARKER SKIN DUAL TIP RULER LAB (MISCELLANEOUS) ×1 IMPLANT
NDL SAFETY ECLIP 18X1.5 (MISCELLANEOUS) ×1 IMPLANT
NS IRRIG 1000ML POUR BTL (IV SOLUTION) ×1 IMPLANT
NS IRRIG 500ML POUR BTL (IV SOLUTION) IMPLANT
PACK LAMINECTOMY NEURO (CUSTOM PROCEDURE TRAY) ×1 IMPLANT
PAD ARMBOARD 7.5X6 YLW CONV (MISCELLANEOUS) ×1 IMPLANT
PENCIL SMOKE EVACUATOR COATED (MISCELLANEOUS) IMPLANT
SURGIFLO W/THROMBIN 8M KIT (HEMOSTASIS) ×1 IMPLANT
SUT DVC VLOC 3-0 CL 6 P-12 (SUTURE) ×1 IMPLANT
SUT ETHILON 3-0 FS-10 30 BLK (SUTURE) ×1
SUT VIC AB 0 CT1 27 (SUTURE) ×1
SUT VIC AB 0 CT1 27XCR 8 STRN (SUTURE) ×1 IMPLANT
SUT VIC AB 2-0 CT1 18 (SUTURE) ×1 IMPLANT
SUTURE EHLN 3-0 FS-10 30 BLK (SUTURE) IMPLANT
SYR 10ML LL (SYRINGE) ×2 IMPLANT
SYR 30ML LL (SYRINGE) ×2 IMPLANT
SYR 3ML LL SCALE MARK (SYRINGE) ×1 IMPLANT
TRAP FLUID SMOKE EVACUATOR (MISCELLANEOUS) ×1 IMPLANT
WATER STERILE IRR 1000ML POUR (IV SOLUTION) ×2 IMPLANT
WATER STERILE IRR 500ML POUR (IV SOLUTION) IMPLANT

## 2022-07-25 NOTE — Anesthesia Procedure Notes (Signed)
Procedure Name: Intubation Date/Time: 07/25/2022 7:30 AM  Performed by: Esaw Grandchild, CRNAPre-anesthesia Checklist: Patient identified, Emergency Drugs available, Suction available and Patient being monitored Patient Re-evaluated:Patient Re-evaluated prior to induction Oxygen Delivery Method: Circle system utilized Preoxygenation: Pre-oxygenation with 100% oxygen Induction Type: IV induction Ventilation: Mask ventilation without difficulty Laryngoscope Size: Miller and 2 Grade View: Grade I Tube type: Oral Tube size: 7.0 mm Number of attempts: 1 Airway Equipment and Method: Stylet, Oral airway and Bite block Placement Confirmation: ETT inserted through vocal cords under direct vision, positive ETCO2 and breath sounds checked- equal and bilateral Secured at: 20 cm Tube secured with: Tape Dental Injury: Teeth and Oropharynx as per pre-operative assessment and Injury to lip

## 2022-07-25 NOTE — Plan of Care (Signed)
  Problem: Education: Goal: Ability to verbalize activity precautions or restrictions will improve Outcome: Progressing Goal: Knowledge of the prescribed therapeutic regimen will improve Outcome: Progressing Goal: Understanding of discharge needs will improve Outcome: Progressing

## 2022-07-25 NOTE — Anesthesia Preprocedure Evaluation (Addendum)
Anesthesia Evaluation  Patient identified by MRN, date of birth, ID band Patient awake    Reviewed: Allergy & Precautions, NPO status , Patient's Chart, lab work & pertinent test results  Airway Mallampati: III  TM Distance: >3 FB Neck ROM: full    Dental  (+) Missing,    Pulmonary asthma    Pulmonary exam normal        Cardiovascular Exercise Tolerance: Poor hypertension, + CAD, + Past MI (2022), + Cardiac Stents (staged PCI 09/19/2021 - PTCA/DES x1 (unknown type) to mLAD), + Peripheral Vascular Disease, +CHF (HFrEF) and + DOE  Normal cardiovascular exam+ dysrhythmias Atrial Fibrillation + Valvular Problems/Murmurs MR   LHC 09/15/2021: 90% mLAD, 70% D1, 99% pLCx, 95% mRCA.  newly Dx'd. EF 35%, glob HK, sep/inf/post AK, mild LAE, triv AR/PR, mild MR/TR, GLS -9.1%, G2DD; b.) TTE 02/14/2022: EF 60-65%, mild-mod MR, G1DD  LHC/PCI 09/15/2021: 90% mLAD (plans for staged PCI), 70% D1, 99% pLCx (2.75 x 30 mm Resolute Onyx Frontier DES), 95% mRCA (3.5 x 15 mm Resolute Onyx Frontier DES); b.) staged PCI 09/19/2021 - PTCA/DES x1 (unknown type) to mLAD   Neuro/Psych  PSYCHIATRIC DISORDERS Anxiety      Neuromuscular disease    GI/Hepatic Neg liver ROS,GERD  Controlled,,  Endo/Other  negative endocrine ROS    Renal/GU Renal InsufficiencyRenal disease     Musculoskeletal  (+) Arthritis ,    Abdominal Normal abdominal exam  (+)   Peds  Hematology negative hematology ROS (+)   Anesthesia Other Findings Past Medical History: No date: A-fib (HCC)     Comment:  a.) CHA2DS2VASc = 6 (age x 2, sex, HFrEF, HTN, NSTEMI);               b.) rate/rhythm maintained on oral metoprolol succinate;               chronically anticoagulated with apixaban + clopidogrel No date: Allergic rhinitis No date: Anemia No date: Anxiety No date: Arthritis No date: Asthma 1995: Breast cancer, right ([place])     Comment:  a.) s/p surgical resection  (mastectomy) + chemotherapy +              5 years endocrine therapy (tamoxifen) No date: CAD (coronary artery disease)     Comment:  a.) LHC 09/15/2021: 90% mLAD, 70% D1, 99% pLCx, 95%               mRCA. No date: Carpal tunnel syndrome No date: Cervical radiculopathy due to degenerative joint disease of  spine No date: Chronic kidney disease (CKD), stage IV (severe) (HCC) No date: Chronic use of opiate for therapeutic purpose     Comment:  a.) has naloxone Rx in place No date: Depression No date: Diverticulitis No date: Diverticulosis No date: DOE (dyspnea on exertion) No date: Followed by palliative care service No date: GERD (gastroesophageal reflux disease) 06/2005: H. pylori infection 12/2005: H/O bilateral cataract extraction 09/14/2021: HFrEF (heart failure with reduced ejection fraction) ([place])     Comment:  a.) newly Dx'd. EF 35%, glob HK, sep/inf/post AK, mild               LAE, triv AR/PR, mild MR/TR, GLS -9.1%, G2DD; b.) TTE               02/14/2022: EF 60-65%, mild-mod MR, G1DD 1990: History of spinal fracture     Comment:  a.) T7 and T12 fractures - traumatic sequela of a  mechanical fall No date: HLD (hyperlipidemia) No date: HTN (hypertension) No date: Impaired glucose tolerance No date: LBBB (left bundle branch block) No date: Long term current use of anticoagulant     Comment:  a.) dose reduced apixaban No date: Long term current use of antithrombotics/antiplatelets     Comment:  a.) clopidogrel No date: Lumbago No date: Lumbar radiculopathy No date: Macular degeneration 09/13/2021: NSTEMI (non-ST elevated myocardial infarction) ([place])     Comment:  a.) LHC/PCI 09/15/2021: 90% mLAD (plans for staged PCI),              70% D1, 99% pLCx (2.75 x 30 mm Resolute Onyx Frontier               DES), 95% mRCA (3.5 x 15 mm Resolute Onyx Frontier DES);               b.) staged PCI 09/19/2021 - PTCA/DES x1 (unknown type) to              mLAD No date:  Osteoporosis No date: Ovarian cyst No date: Paresthesia of both hands No date: Peripheral edema No date: PVD (peripheral vascular disease) ([place]) No date: Vitamin D deficiency  Past Surgical History: 1972: ABDOMINAL HYSTERECTOMY 08/10/2019: CARPAL TUNNEL RELEASE; Right 2007: CATARACT EXTRACTION; Bilateral No date: COLONOSCOPY 09/15/2021: CORONARY ANGIOPLASTY WITH STENT PLACEMENT; Left     Comment:  Procedure: CORONARY ANGIOPLASTY WITH STENT PLACEMENT;               Location: [facility]; Surgeon: Glynn Octave, MD 09/19/2021: CORONARY ANGIOPLASTY WITH STENT PLACEMENT; Left     Comment:  Procedure: CORONARY ANGIOPLASTY WITH STENT PLACEMENT;               Location: [facility]; Surgeon: Varney Biles, MD 1999: MASTECTOMY; Right 2004: SIGMOIDOSCOPY 2006: UPPER GASTROINTESTINAL ENDOSCOPY  BMI    Body Mass Index: 23.81 kg/m      Reproductive/Obstetrics negative OB ROS                             Anesthesia Physical Anesthesia Plan  ASA: 3  Anesthesia Plan: General ETT   Post-op Pain Management: Tylenol PO (pre-op)* and Regional block*   Induction: Intravenous  PONV Risk Score and Plan: 3 and Ondansetron, Dexamethasone and Midazolam  Airway Management Planned: Oral ETT  Additional Equipment:   Intra-op Plan:   Post-operative Plan: Extubation in OR  Informed Consent: I have reviewed the patients History and Physical, chart, labs and discussed the procedure including the risks, benefits and alternatives for the proposed anesthesia with the patient or authorized representative who has indicated his/her understanding and acceptance.     Dental Advisory Given  Plan Discussed with: Anesthesiologist, CRNA and Surgeon  Anesthesia Plan Comments:          Anesthesia Quick Evaluation

## 2022-07-25 NOTE — H&P (Signed)
Referring Physician:  No referring provider defined for this encounter.  Primary Physician:  Cheyenne Maudlin, NP  History of Present Illness: 07/25/2022 Cheyenne [name] is here today with a chief complaint of left leg pain.  06/21/2022 [name] is here today with a chief complaint of left lower back pain with left buttock and left posterior lateral thigh pain.  She sometimes gets pain on the anterolateral aspect of her calf, but most of her pain is above her knee.  She has been having pain for approximate 1 year.  Walking and standing make it much worse.  Laying down makes her pain go away.     Bowel/Bladder Dysfunction: none   Conservative measures:  Physical therapy: has participated this year with [facility] Multimodal medical therapy including regular antiinflammatories: oxycodone, hydrocodone, lyrica, tizanidine, meloxicam Injections:  has had epidural steroid injections 05/02/22: Left Superior cluneal nerve radiofrequency ablation 03/29/22: Left superior cluneal nerve block 03/07/22: Left L4-5 TFESI at [facility] - no relief 12/06/21: Left L4-5 TFESI at [facility] - no relief 09/01/21: L4-5 TF ESI Left Hip injection   Past Surgery: denies   Cheyenne [name] has no symptoms of cervical myelopathy.   The symptoms are causing a significant impact on the patient's life.   Review of Systems:  A 10 point review of systems is negative, except for the pertinent positives and negatives detailed in the HPI.  Past Medical History: Past Medical History:  Diagnosis Date   A-fib [facility])    a.) CHA2DS2VASc = 6 (age x 2, sex, HFrEF, HTN, NSTEMI);  b.) rate/rhythm maintained on oral metoprolol succinate; chronically anticoagulated with apixaban + clopidogrel   Allergic rhinitis    Anemia    Anxiety    Arthritis    Asthma    Breast cancer, right ([place]) 1995   a.) s/p surgical resection (mastectomy) + chemotherapy + 5 years endocrine therapy  (tamoxifen)   CAD (coronary artery disease)    a.) LHC 09/15/2021: 90% mLAD, 70% D1, 99% pLCx, 95% mRCA.   Carpal tunnel syndrome    Cervical radiculopathy due to degenerative joint disease of spine    Chronic kidney disease (CKD), stage IV (severe) (HCC)    Chronic use of opiate for therapeutic purpose    a.) has naloxone Rx in place   Depression    Diverticulitis    Diverticulosis    DOE (dyspnea on exertion)    Followed by palliative care service    GERD (gastroesophageal reflux disease)    H. pylori infection 06/2005   H/O bilateral cataract extraction 12/2005   HFrEF (heart failure with reduced ejection fraction) ([place]) 09/14/2021   a.) newly Dx'd. EF 35%, glob HK, sep/inf/post AK, mild LAE, triv AR/PR, mild MR/TR, GLS -9.1%, G2DD; b.) TTE 02/14/2022: EF 60-65%, mild-mod MR, G1DD   History of spinal fracture 1990   a.) T7 and T12 fractures - traumatic sequela of a mechanical fall   HLD (hyperlipidemia)    HTN (hypertension)    Impaired glucose tolerance    LBBB (left bundle branch block)    Long term current use of anticoagulant    a.) dose reduced apixaban   Long term current use of antithrombotics/antiplatelets    a.) clopidogrel   Lumbago    Lumbar radiculopathy    Macular degeneration    NSTEMI (non-ST elevated myocardial infarction) ([place]) 09/13/2021   a.) LHC/PCI 09/15/2021: 90% mLAD (plans for staged PCI), 70% D1, 99% pLCx (2.75 x  30 mm Resolute Onyx Frontier DES), 95% mRCA (3.5 x 15 mm Resolute Onyx Frontier DES); b.) staged PCI 09/19/2021 - PTCA/DES x1 (unknown type) to mLAD   Osteoporosis    Ovarian cyst    Paresthesia of both hands    Peripheral edema    PVD (peripheral vascular disease) (HCC)    Vitamin D deficiency     Past Surgical History: Past Surgical History:  Procedure Laterality Date   ABDOMINAL HYSTERECTOMY  1972   CARPAL TUNNEL RELEASE Right 08/10/2019   CATARACT EXTRACTION Bilateral 2007   COLONOSCOPY     CORONARY ANGIOPLASTY WITH STENT  PLACEMENT Left 09/15/2021   Procedure: CORONARY ANGIOPLASTY WITH STENT PLACEMENT; Location: [facility]; Surgeon: Glynn Octave, MD   CORONARY ANGIOPLASTY WITH STENT PLACEMENT Left 09/19/2021   Procedure: CORONARY ANGIOPLASTY WITH STENT PLACEMENT; Location: [facility]; Surgeon: Varney Biles, MD   MASTECTOMY Right 1999   SIGMOIDOSCOPY  2004   UPPER GASTROINTESTINAL ENDOSCOPY  2006    Allergies: Allergies as of 07/16/2022 - Review Complete 07/03/2022  Allergen Reaction Noted   Alendronate  01/07/2014   Levofloxacin  01/07/2014   Codeine Nausea Only 01/07/2014   Propoxyphene Nausea Only 01/07/2014    Medications: Current Meds  Medication Sig   acetaminophen (TYLENOL) 500 MG tablet Take 1,000 mg by mouth every 6 (six) hours as needed.   Cholecalciferol (VITAMIN D3 PO) Take by mouth daily.   clopidogrel (PLAVIX) 75 MG tablet Take 75 mg by mouth daily.   cyanocobalamin 1000 MCG tablet TAKE 1 TABLET EVERY DAY FOR VITAMIN B12 DEFICIENCY   HYDROcodone-acetaminophen (NORCO/VICODIN) 5-325 MG tablet Take 1 tablet by mouth every 4 (four) hours as needed.   lisinopril (ZESTRIL) 2.5 MG tablet Take 2.5 mg by mouth daily.   metoprolol succinate (TOPROL-XL) 25 MG 24 hr tablet Take 25 mg by mouth daily.   pregabalin (LYRICA) 150 MG capsule Take 150 mg by mouth 2 (two) times daily.   torsemide (DEMADEX) 20 MG tablet Take 20 mg by mouth daily.    Social History: Social History   Tobacco Use   Smoking status: Never   Smokeless tobacco: Never  Vaping Use   Vaping Use: Never used  Substance Use Topics   Alcohol use: Never   Drug use: Never    Family Medical History: History reviewed. No pertinent family history.  Physical Examination: Vitals:   07/25/22 0642  BP: (!) 159/61  Pulse: 66  Resp: 16  Temp: 98.3 F (36.8 C)  SpO2: 95%   Heart sounds normal no MRG. Chest Clear to Auscultation Bilaterally.  General: Patient is well developed, well nourished, calm, collected, and in no apparent  distress. Attention to examination is appropriate.  Neck:   Supple.  Full range of motion.  Respiratory: Patient is breathing without any difficulty.   NEUROLOGICAL:     Awake, alert, oriented to person, place, and time.  Speech is clear and fluent. Fund of knowledge is appropriate.   Cranial Nerves: Pupils equal round and reactive to light.  Facial tone is symmetric.  Facial sensation is symmetric. Shoulder shrug is symmetric. Tongue protrusion is midline.  There is no pronator drift.  ROM of spine: full.    Strength: Side Biceps Triceps Deltoid Interossei Grip Wrist Ext. Wrist Flex.  R '5 5 5 5 5 5 5  '$ L '5 5 5 5 5 5 5   '$ Side Iliopsoas Quads Hamstring PF DF EHL  R '5 5 5 5 5 5  '$ L '5 5 5 5 5 '$ 5  Reflexes are 1+ and symmetric at the biceps, triceps, brachioradialis, patella and achilles.   Hoffman's is absent.   Bilateral upper and lower extremity sensation is intact to light touch.    No evidence of dysmetria noted.  Gait is untested.     Medical Decision Making  Imaging: MRI L spine 08/30/21 MRI lumbar spine on August 30, 2021 shows moderate L3-4 and L4-5 central canal stenosis with mild L2-3 stenosis.  There is moderate neuroforaminal narrowing on the right at L2-3 as well as bilaterally at L3-4 and L4-5.  There is grade 1 anterolisthesis of L4 and L5.  Chronic T12 compression fracture.   I have personally reviewed the images and agree with the above interpretation.  I have personally reviewed the images and agree with the above interpretation.  Assessment and Plan: Ms. Bennion is a pleasant 88 y.o. female with  left L5 radiculopathy.  She has anterolisthesis of L4 and L5.  It is mobile on x-rays.   She has multiple medical issues including cardiac disease as well as severe kidney disease.  She has failed conservative management and would be a candidate for L4-5 intervention, but is not a candidate for a lumbar spinal fusion which would be the most appropriate  treatment.  We will proceed with left L4-5 laminoforaminotomy.     Deondrea Markos K. Izora Ribas MD, [facility] Neurosurgery

## 2022-07-25 NOTE — Anesthesia Postprocedure Evaluation (Signed)
Anesthesia Post Note  Patient: Esbeydi T Swart  Procedure(s) Performed: LEFT L4-5 LAMINOFORAMINOTOMY (Left: Spine Lumbar)  Patient location during evaluation: PACU Anesthesia Type: General Level of consciousness: awake and alert Pain management: pain level controlled Vital Signs Assessment: post-procedure vital signs reviewed and stable Respiratory status: spontaneous breathing, nonlabored ventilation and respiratory function stable Cardiovascular status: blood pressure returned to baseline and stable Postop Assessment: no apparent nausea or vomiting Anesthetic complications: no   No notable events documented.   Last Vitals:  Vitals:   07/25/22 1045 07/25/22 1107  BP: (!) 155/72 (!) 173/76  Pulse: 78 83  Resp: 16 20  Temp: (!) 36.1 C 37.1 C  SpO2: 93% 95%    Last Pain:  Vitals:   07/25/22 1045  TempSrc:   PainSc: 0-No pain                 [place] Ouch

## 2022-07-25 NOTE — Transfer of Care (Signed)
Immediate Anesthesia Transfer of Care Note  Patient: Cheyenne [name]  Procedure(s) Performed: LEFT L4-5 LAMINOFORAMINOTOMY (Left: Spine Lumbar)  Patient Location: PACU  Anesthesia Type:General  Level of Consciousness: drowsy  Airway & Oxygen Therapy: Patient Spontanous Breathing and Patient connected to face mask oxygen  Post-op Assessment: Report given to RN, Post -op Vital signs reviewed and stable, and Patient moving all extremities  Post vital signs: Reviewed and stable  Last Vitals:  Vitals Value Taken Time  BP    Temp    Pulse    Resp    SpO2      Last Pain:  Vitals:   07/25/22 0642  TempSrc: Temporal  PainSc: 0-No pain      Patients Stated Pain Goal: 0 (09/73/53 2992)  Complications: No notable events documented.

## 2022-07-25 NOTE — Op Note (Signed)
Indications: [name] is suffering from M54.16 lumbar radiculopathy. The patient tried and failed conservative management, prompting surgical intervention.  Findings: synovial cyst  Preoperative Diagnosis: M54.16 lumbar radiculopathy  Postoperative Diagnosis: same   EBL: 20 ml IVF: see AR ml Drains: 1 drain Disposition: Extubated and Stable to PACU Complications: none  No foley catheter was placed.   Preoperative Note:   Risks of surgery discussed include: infection, bleeding, stroke, coma, death, paralysis, CSF leak, nerve/spinal cord injury, numbness, tingling, weakness, complex regional pain syndrome, recurrent stenosis and/or disc herniation, vascular injury, development of instability, neck/back pain, need for further surgery, persistent symptoms, development of deformity, and the risks of anesthesia. The patient understood these risks and agreed to proceed.  Operative Note:   1. Left L4-5 lumbar decompression  The patient was then brought from the preoperative center with intravenous access established.  The patient underwent general anesthesia and endotracheal tube intubation, and was then rotated on the [place] rail top where all pressure points were appropriately padded.  The skin was then thoroughly cleansed.  Perioperative antibiotic prophylaxis was administered.  Sterile prep and drapes were then applied and a timeout was then observed.  C-arm was brought into the field under sterile conditions and under lateral visualization the L4-5 interspace was identified and marked.  The incision was marked on the left and injected with local anesthetic. Once this was complete a 2 cm incision was opened with the use of a #10 blade knife.    The metrx tubes were sequentially advanced and confirmed in position at L4-5. An 82m by 521mtube was locked in place to the bed side attachment.  The microscope was then sterilely brought into the field and muscle creep was hemostased  with a bipolar and resected with a pituitary rongeur.  A Bovie extender was then used to expose the spinous process and lamina.  Careful attention was placed to not violate the facet capsule. A 3 mm matchstick drill bit was then used to make a hemi-laminotomy trough until the ligamentum flavum was exposed.  This was extended to the base of the spinous process and to the contralateral side to remove all the central bone from each side.  Once this was complete and the underlying ligamentum flavum was visualized, it was dissected with a curette and resected with Kerrison rongeurs.  Extensive ligamentum hypertrophy was noted, requiring a substantial amount of time and care for removal.  The dura was identified and palpated. The kerrison rongeur was then used to remove the medial facet bilaterally until no compression was noted.  A balltip probe was used to confirm decompression of the ipsilateral L5 nerve root.  No CSF leak was noted.  A Depo-Medrol soaked Gelfoam pledget was placed in the defect.  The wound was copiously irrigated. The tube system was then removed under microscopic visualization and hemostasis was obtained with a bipolar.  A drain was placed.  The fascial layer was reapproximated with the use of a 0 Vicryl suture.  Subcutaneous tissue layer was reapproximated using 2-0 Vicryl suture.  3-0 monocryl was placed in subcuticular fashion. The skin was then cleansed and Dermabond was used to close the skin opening.  Patient was then rotated back to the preoperative bed awakened from anesthesia and taken to recovery all counts are correct in this case.  I performed the entire procedure with the assistance of Cheyenne [name] as an asPensions consultantAn assistant was required for this procedure due to the complexity.  The assistant  provided assistance in tissue manipulation and suction, and was required for the successful and safe performance of the procedure. I performed the critical portions of the  procedure.   Cheyenne Hain K. Izora Ribas MD

## 2022-07-26 DIAGNOSIS — M5416 Radiculopathy, lumbar region: Secondary | ICD-10-CM | POA: Diagnosis not present

## 2022-07-26 MED ORDER — METHOCARBAMOL 500 MG PO TABS: 500.0000 mg | ORAL_TABLET | Freq: Four times a day (QID) | ORAL | 0 refills | Status: DC | PRN

## 2022-07-26 MED ORDER — SENNA 8.6 MG PO TABS: 1.0000 | ORAL_TABLET | Freq: Two times a day (BID) | ORAL | 0 refills | Status: DC | PRN

## 2022-07-26 MED ORDER — HYDROCODONE-ACETAMINOPHEN 5-325 MG PO TABS: 1.0000 | ORAL_TABLET | ORAL | 0 refills | Status: AC | PRN

## 2022-07-26 NOTE — Progress Notes (Signed)
Discharge instructions given to the patient.  Emphasized education on surgical wound care, signs of infection, and when to resume medications as instructed by neurosurgery.  Patient verbalized understanding.  Low mid back dressing on old drainage site with minimal serous-sanguinous drainage.  Surgical site CDI.  Needs addressed.  Patient picked up by daughter Bethena Roys, discharged home.

## 2022-07-26 NOTE — Evaluation (Signed)
Occupational Therapy Evaluation Patient Details Name: Cheyenne [name] MRN: [id number] DOB: Jul 03, 1928 Today's Date: 07/26/2022   History of Present Illness 88 yo female s/p left L4-5 Laminoforaminotomy. PMH includes HTN, asthma, CAD, Past MI, PVD, CHF, Afib, Murmurs, Renal Disease, and CKD   Clinical Impression   Patient presenting with decreased independence in self-care, functional mobility, safety, strength, and endurance. Patient reports she lives at home alone, but will be living with her daughter until she is able to care for herself. Her daughter is able to provide 24/7 assistance at discharge. Patient reports, at baseline she uses a rollator to ambulate, independent with ADLs and her neighbors assist her with meals and to appointments. Has a BSC. Patient currently functioning at supervison/ min guard for rolling into sidelying, min A for sidelying to sit EOB.  Min guard Sit<> stand using RW. SP02 monitored and removed SP02 at 94% on RA. Nurse present/ notified. Ambulated ~30 with min guard to bathroom and transferred to toilet and peri-care performed with min guard/ supervision. Patient educated on log rolling, back precautions, and LB dressing trechniques (figure 4) Patient was able to demonstrate and verbalize understanding. Recommending RW. Patient left in recliner with call bell in reach, chair alarm set, and all needs met. Patient will benefit from acute OT to increase overall independence in the areas of ADLs, functional mobility,  in order to safely discharge home.       Recommendations for follow up therapy are one component of a multi-disciplinary discharge planning process, led by the attending physician.  Recommendations may be updated based on patient status, additional functional criteria and insurance authorization.   Follow Up Recommendations  No OT follow up    Assistance Recommended at Discharge Frequent or constant Supervision/Assistance  Patient can return home with  the following A little help with walking and/or transfers;A little help with bathing/dressing/bathroom;Assist for transportation;Assistance with cooking/housework;Help with stairs or ramp for entrance    Functional Status Assessment  Patient has had a recent decline in their functional status and demonstrates the ability to make significant improvements in function in a reasonable and predictable amount of time.  Equipment Recommendations  Other (comment) (RW)       Precautions / Restrictions Precautions Precautions: Back Precaution Booklet Issued: No Precaution Comments: BLT Restrictions Weight Bearing Restrictions: No      Mobility Bed Mobility Overal bed mobility: Needs Assistance Bed Mobility: Rolling, Sidelying to Sit Rolling: Supervision, Min guard Sidelying to sit: Min assist            Transfers Overall transfer level: Needs assistance Equipment used: Rolling walker (2 wheels) Transfers: Sit to/from Stand Sit to Stand: Min guard           General transfer comment: required VC for hand placment for safety      Balance Overall balance assessment: Needs assistance Sitting-balance support: Feet supported, No upper extremity supported Sitting balance-Leahy Scale: Good     Standing balance support: Bilateral upper extremity supported Standing balance-Leahy Scale: Fair                             ADL either performed or assessed with clinical judgement   ADL Overall ADL's : Needs assistance/impaired                     Lower Body Dressing: Minimal assistance Lower Body Dressing Details (indicate cue type and reason): Patient educated on figure 4 sitting to assist  with LB dressing. Anticipate patient will need min A.             Functional mobility during ADLs: Supervision/safety;Min guard;Rolling walker (2 wheels);Cueing for safety       Vision Patient Visual Report: No change from baseline              Pertinent  Vitals/Pain Pain Assessment Pain Assessment: No/denies pain     Hand Dominance Right   Extremity/Trunk Assessment Upper Extremity Assessment Upper Extremity Assessment: Overall WFL for tasks assessed   Lower Extremity Assessment Lower Extremity Assessment: Generalized weakness   Cervical / Trunk Assessment Cervical / Trunk Assessment: Normal   Communication Communication Communication: HOH   Cognition Arousal/Alertness: Awake/alert Behavior During Therapy: WFL for tasks assessed/performed Overall Cognitive Status: Within Functional Limits for tasks assessed                                                  Home Living Family/patient expects to be discharged to:: Private residence Living Arrangements: Alone Available Help at Discharge: Family;Neighbor;Friend(s) Type of Home: House Home Access: Stairs to enter [employer] of Steps: 1 Entrance Stairs-Rails: None Home Layout: One level     Bathroom Shower/Tub: Teacher, early years/pre: Standard Bathroom Accessibility: Yes   Home Equipment: Rollator (4 wheels);Cane - single point;BSC/3in1;Shower seat          Prior Functioning/Environment Prior Level of Function : Independent/Modified Independent             Mobility Comments: Mod I amb with rollator ADLs Comments: neighbor drives her        OT Problem List: Decreased strength;Decreased safety awareness;Decreased activity tolerance         OT Goals(Current goals can be found in the care plan section) Acute Rehab OT Goals Patient Stated Goal: return home OT Goal Formulation: With patient Time For Goal Achievement: 08/09/22 Potential to Achieve Goals: Good ADL Goals Pt Will Perform Lower Body Bathing: with modified independence Pt Will Perform Lower Body Dressing: with modified independence Pt Will Transfer to Toilet: with modified independence Pt Will Perform Toileting - Clothing Manipulation and hygiene: with  modified independence Additional ADL Goal #1: Patient will verbalize and adhere to back precautions while completing ADL and IADL tasks.  OT Frequency:      Co-evaluation              AM-PAC OT "6 Clicks" Daily Activity     Outcome Measure Help from another person eating meals?: None Help from another person taking care of personal grooming?: None Help from another person toileting, which includes using toliet, bedpan, or urinal?: A Little Help from another person bathing (including washing, rinsing, drying)?: A Little Help from another person to put on and taking off regular upper body clothing?: None Help from another person to put on and taking off regular lower body clothing?: A Little 6 Click Score: 21   End of Session Equipment Utilized During Treatment: Rolling walker (2 wheels) Nurse Communication: Mobility status;Other (comment) (SP02 removed, at 94% on RA)  Activity Tolerance: Patient tolerated treatment well Patient left: in chair;with call bell/phone within reach;with chair alarm set  OT Visit Diagnosis: Muscle weakness (generalized) (M62.81)                Time: 9735-3299 OT Time Calculation (min): 30 min Charges:  OT  General Charges $OT Visit: 1 Visit OT Evaluation $OT Eval Low Complexity: 1 Low OT Treatments $Self Care/Home Management : 8-22 mins   Falesha Schommer, OTS 07/26/2022, 10:41 AM

## 2022-07-26 NOTE — Discharge Instructions (Signed)
Your surgeon has performed an operation on your lumbar spine (low back) to relieve pressure on one or more nerves. Many times, patients feel better immediately after surgery and can "overdo it." Even if you feel well, it is important that you follow these activity guidelines. If you do not let your back heal properly from the surgery, you can increase the chance of a disc herniation and/or return of your symptoms. The following are instructions to help in your recovery once you have been discharged from the hospital.  * It is ok to take NSAIDs after surgery.  Activity    No bending, lifting, or twisting ("BLT"). Avoid lifting objects heavier than 10 pounds (gallon milk jug).  Where possible, avoid household activities that involve lifting, bending, pushing, or pulling such as laundry, vacuuming, grocery shopping, and childcare. Try to arrange for help from friends and family for these activities while your back heals.  Increase physical activity slowly as tolerated.  Taking short walks is encouraged, but avoid strenuous exercise. Do not jog, run, bicycle, lift weights, or participate in any other exercises unless specifically allowed by your doctor. Avoid prolonged sitting, including car rides.  Talk to your doctor before resuming sexual activity.  You should not drive until cleared by your doctor.  Until released by your doctor, you should not return to work or school.  You should rest at home and let your body heal.   You may shower two days after your surgery.  After showering, lightly dab your incision dry. Do not take a tub bath or go swimming for 3 weeks, or until approved by your doctor at your follow-up appointment.  If you smoke, we strongly recommend that you quit.  Smoking has been proven to interfere with normal healing in your back and will dramatically reduce the success rate of your surgery. Please contact [facility] (800-QUIT-NOW) and use the resources at www.[facility].com for  assistance in stopping smoking.  Surgical Incision   If you have a dressing on your incision, you may remove it three days after your surgery. Keep your incision area clean and dry.  If you have staples or stitches on your incision, you should have a follow up scheduled for removal. If you do not have staples or stitches, you will have steri-strips (small pieces of surgical tape) or Dermabond glue. The steri-strips/glue should begin to peel away within about a week (it is fine if the steri-strips fall off before then). If the strips are still in place one week after your surgery, you may gently remove them.  Diet            You may return to your usual diet. Be sure to stay hydrated.  When to Contact Us  Although your surgery and recovery will likely be uneventful, you may have some residual numbness, aches, and pains in your back and/or legs. This is normal and should improve in the next few weeks.  However, should you experience any of the following, contact us immediately: New numbness or weakness Pain that is progressively getting worse, and is not relieved by your pain medications or rest Bleeding, redness, swelling, pain, or drainage from surgical incision Chills or flu-like symptoms Fever greater than 101.0 F (38.3 C) Problems with bowel or bladder functions Difficulty breathing or shortness of breath Warmth, tenderness, or swelling in your calf  Contact Information During office hours (Monday-Friday 9 am to 5 pm), please call your physician at [phone] and ask for Kendelyn Jean After hours and   weekends, please call [phone] and speak with the neurosurgeon on call For a life-threatening emergency, call 911 

## 2022-07-26 NOTE — Discharge Summary (Signed)
Physician Discharge Summary  Patient ID: Cheyenne [name] MRN: [id number] DOB/AGE: 86/01/1928 86 y.o.  Admit date: 07/25/2022 Discharge date: 07/26/2022  Admission Diagnoses:Principal Problem:   Spondylolisthesis of lumbar region Active Problems:   Lumbar radiculopathy  Discharge Diagnoses:  Principal Problem:   Spondylolisthesis of lumbar region Active Problems:   Lumbar radiculopathy   Discharged Condition: good  Hospital Course:  Cheyenne [name] underwent elective surgery.  She did very well with surgery and was stable for discharge on POD1/  Consults: None  Significant Diagnostic Studies: radiology: X-Ray: localization  Treatments: surgery: L4-5 decompression  Discharge Exam: Blood pressure (!) 146/61, pulse 70, temperature (!) 97.4 F (36.3 C), resp. rate 16, height _0  (1.549 m), weight 57.2 kg, SpO2 95 %. General appearance: alert and cooperative CNI MAEW Incision c/d/i  Disposition: Discharge disposition: 01-Home or Self Care       Discharge Instructions     Discharge patient   Complete by: As directed    Discharge disposition: 01-Home or Self Care   Discharge patient date: 07/26/2022   Incentive spirometry RT   Complete by: As directed       Allergies as of 07/26/2022       Reactions   Alendronate    Other reaction(s): Abdominal Pain, Other (See Comments), Other (See Comments) abd pain abd pain abd pain   Levofloxacin    Other reaction(s): Muscle Pain   Codeine Nausea Only   Propoxyphene Nausea Only        Medication List     TAKE these medications    acetaminophen 500 MG tablet Commonly known as: TYLENOL Take 1,000 mg by mouth every 6 (six) hours as needed.   cetirizine 10 MG tablet Commonly known as: ZYRTEC Take by mouth as needed.   clopidogrel 75 MG tablet Commonly known as: PLAVIX Take 75 mg by mouth daily. Notes to patient: Ok to restart on 08/08/22   cyanocobalamin 1000 MCG tablet TAKE 1 TABLET EVERY DAY FOR  VITAMIN B12 DEFICIENCY   Eliquis 2.5 MG Tabs tablet Generic drug: apixaban Take 2.5 mg by mouth 2 (two) times daily. Notes to patient: Ok to restart on 08/08/22   HYDROcodone-acetaminophen 5-325 MG tablet Commonly known as: NORCO/VICODIN Take 1-2 tablets by mouth every 4 (four) hours as needed for up to 5 days. What changed: how much to take   Jardiance 10 MG Tabs tablet Generic drug: empagliflozin Take 10 mg by mouth daily.   lisinopril 2.5 MG tablet Commonly known as: ZESTRIL Take 2.5 mg by mouth daily.   methocarbamol 500 MG tablet Commonly known as: ROBAXIN Take 1 tablet (500 mg total) by mouth every 6 (six) hours as needed for muscle spasms.   metoprolol succinate 25 MG 24 hr tablet Commonly known as: TOPROL-XL Take 25 mg by mouth daily.   naloxone 4 MG/0.1ML Liqd nasal spray kit Commonly known as: NARCAN SMARTSIG:Both Nares   nitroGLYCERIN 0.4 MG SL tablet Commonly known as: NITROSTAT   pantoprazole 40 MG tablet Commonly known as: Protonix Take 1 tablet (40 mg total) by mouth daily.   pregabalin 150 MG capsule Commonly known as: LYRICA Take 150 mg by mouth 2 (two) times daily.   senna 8.6 MG Tabs tablet Commonly known as: SENOKOT Take 1 tablet (8.6 mg total) by mouth 2 (two) times daily as needed for mild constipation.   torsemide 20 MG tablet Commonly known as: DEMADEX Take 20 mg by mouth daily.   VITAMIN D3 PO Take by mouth daily.  Durable Medical Equipment  (From admission, onward)           Start     Ordered   07/26/22 1218  For home use only DME Walker rolling  Once       Question Answer Comment  Walker: With [place] Wheels   Patient needs a walker to treat with the following condition Impaired mobility      07/26/22 1217             Signed: Jamia Hoban 07/26/2022, 4:50 PM

## 2022-07-26 NOTE — Evaluation (Signed)
Physical Therapy Evaluation Patient Details Name: Cheyenne [name] MRN: [id number] DOB: 86-Nov-1929 Today's Date: 07/26/2022  History of Present Illness  88 yo female s/p left L4-5 Laminoforaminotomy. PMH includes HTN, asthma, CAD, Past MI, PVD, CHF, Afib, Murmurs, Renal Disease, and CKD  Clinical Impression  Pt found in chair upon PT entry. Pt educated on BLT precautions and able to verbalize understanding. Sit<>Stand with RW, CGA, and mod VC or hand placement for safety with RW. Pt ambulated 400 ft with CGA and RW. Pt ascended/descended 1 step with no rails or no LOB. Pt very motivated and pleasant throughout session. Pt would benefit from skilled physical therapy to address the listed deficits (see below) to increase independence with ADLs and safety in the home. Current recommendation is HHPT with intermittent assistance to return pt to PLOF.          Recommendations for follow up therapy are one component of a multi-disciplinary discharge planning process, led by the attending physician.  Recommendations may be updated based on patient status, additional functional criteria and insurance authorization.  Follow Up Recommendations Home health PT      Assistance Recommended at Discharge Intermittent Supervision/Assistance  Patient can return home with the following  A little help with walking and/or transfers;A little help with bathing/dressing/bathroom;Assistance with cooking/housework;Assist for transportation;Help with stairs or ramp for entrance    Equipment Recommendations Rolling walker (2 wheels)  Recommendations for Other Services       Functional Status Assessment Patient has had a recent decline in their functional status and demonstrates the ability to make significant improvements in function in a reasonable and predictable amount of time.     Precautions / Restrictions Precautions Precautions: Back Precaution Booklet Issued: No Precaution Comments:  BLT Restrictions Weight Bearing Restrictions: No      Mobility  Bed Mobility               General bed mobility comments: pt in chair upon PT entry    Transfers Overall transfer level: Needs assistance Equipment used: Rolling walker (2 wheels) Transfers: Sit to/from Stand Sit to Stand: Min guard           General transfer comment: required VC for hand placment for safety    Ambulation/Gait Ambulation/Gait assistance: Min guard Gait Distance (Feet): 400 Feet Assistive device: Rolling walker (2 wheels) Gait Pattern/deviations: Step-through pattern, WFL(Within Functional Limits)       General Gait Details: no vc required for RW mechanics  Stairs Stairs: Yes Stairs assistance: Min guard Stair Management: No rails Number of Stairs: 1 General stair comments: no LOB  Wheelchair Mobility    Modified Rankin (Stroke Patients Only)       Balance Overall balance assessment: Needs assistance Sitting-balance support: Feet supported, No upper extremity supported Sitting balance-Leahy Scale: Good     Standing balance support: Bilateral upper extremity supported Standing balance-Leahy Scale: Fair Standing balance comment: pt able to maintain static balance while drinking water                             Pertinent Vitals/Pain Pain Assessment Pain Assessment: No/denies pain    Home Living Family/patient expects to be discharged to:: Private residence Living Arrangements: Alone Available Help at Discharge: Family;Neighbor;Friend(s) Type of Home: House Home Access: Stairs to enter Entrance Stairs-Rails: None Entrance Stairs-Number of Steps: 1   Home Layout: One level Home Equipment: Rollator (4 wheels);Cane - single point;BSC/3in1;Shower seat  Prior Function Prior Level of Function : Independent/Modified Independent             Mobility Comments: Mod I amb with rollator ADLs Comments: neighbor drives her     Hand Dominance    Dominant Hand: Right    Extremity/Trunk Assessment   Upper Extremity Assessment Upper Extremity Assessment: Overall WFL for tasks assessed    Lower Extremity Assessment Lower Extremity Assessment: Generalized weakness    Cervical / Trunk Assessment Cervical / Trunk Assessment: Normal  Communication   Communication: HOH  Cognition Arousal/Alertness: Awake/alert Behavior During Therapy: WFL for tasks assessed/performed Overall Cognitive Status: Within Functional Limits for tasks assessed                                          General Comments      Exercises     Assessment/Plan    PT Assessment Patient needs continued PT services  PT Problem List Decreased strength;Decreased balance;Decreased knowledge of precautions;Decreased mobility;Decreased knowledge of use of DME;Decreased activity tolerance       PT Treatment Interventions DME instruction;Functional mobility training;Balance training;Patient/family education;Gait training;Therapeutic activities;Neuromuscular re-education;Stair training;Therapeutic exercise    PT Goals (Current goals can be found in the Care Plan section)  Acute Rehab PT Goals Patient Stated Goal: to return home PT Goal Formulation: With patient Time For Goal Achievement: 08/09/22 Potential to Achieve Goals: Good    Frequency 7X/week     Co-evaluation               AM-PAC PT "6 Clicks" Mobility  Outcome Measure Help needed turning from your back to your side while in a flat bed without using bedrails?: None Help needed moving from lying on your back to sitting on the side of a flat bed without using bedrails?: None Help needed moving to and from a bed to a chair (including a wheelchair)?: A Little Help needed standing up from a chair using your arms (e.g., wheelchair or bedside chair)?: A Little Help needed to walk in hospital room?: A Little Help needed climbing 3-5 steps with a railing? : A Little 6 Click Score:  20    End of Session Equipment Utilized During Treatment: Gait belt Activity Tolerance: Patient tolerated treatment well Patient left: in chair;with chair alarm set;with call bell/phone within reach Nurse Communication: Mobility status PT Visit Diagnosis: Muscle weakness (generalized) (M62.81);Unsteadiness on feet (R26.81)    Time: 8850-2774 PT Time Calculation (min) (ACUTE ONLY): 36 min   Charges:              Claiborne Billings O'Daniel, SPT 07/26/2022, 10:11 AM

## 2022-07-26 NOTE — Progress Notes (Signed)
Met with the patient in the room She will be staying with her daughter She has a rollator and will need a rolling walker [employer] to deliver the rolling walker to the room Her daughter to provide transportation [facility] is set up for [facility]

## 2022-07-26 NOTE — Progress Notes (Signed)
    Attending Progress Note  History: Cheyenne [name] is here for lumbar radiculopathy.  She is doing well.    POD1: Little pain.  Doing well.  Physical Exam: Vitals:   07/25/22 1306 07/25/22 1953  BP: (!) 167/73 122/68  Pulse: 74 82  Resp: 18 16  Temp: 97.6 F (36.4 C) 98.7 F (37.1 C)  SpO2: 97% 95%    AA Ox3 CNI  Strength:5/5 throughout BLE Drain 50 overnight  Data:  Other tests/results: n/a  Assessment/Plan:  Cheyenne [name] is doing well after lumbar decompression  - mobilize - pain control - DVT prophylaxis - PTOT - Watch drain output and hope to remove this PM   Meade Maw MD, [facility] Department of Neurosurgery

## 2022-07-30 ENCOUNTER — Other Ambulatory Visit: Payer: Medicare HMO | Admitting: Primary Care

## 2022-07-30 DIAGNOSIS — M5432 Sciatica, left side: Secondary | ICD-10-CM

## 2022-07-30 DIAGNOSIS — R54 Age-related physical debility: Secondary | ICD-10-CM

## 2022-07-30 DIAGNOSIS — Z515 Encounter for palliative care: Secondary | ICD-10-CM

## 2022-07-30 NOTE — Progress Notes (Signed)
[facility] Consult Note Telephone: [phone]  Fax: [phone]    Date of encounter: 07/30/22 12:45 PM PATIENT NAME: Cheyenne [name] [address] [address]   [phone] (home)  DOB: [birth year] MRN: [id number] PRIMARY CARE PROVIDER:    Latanya Maudlin, NP,  [place] [address] [phone]  REFERRING PROVIDER:   Latanya Maudlin, NP [place],  [place] [address] [phone]  RESPONSIBLE PARTY:    Contact Information     Name Relation Home Work Twin Daughter [phone]  [phone]   [facility] Relative [phone]     Cheyenne [name], [name] [phone]     Cheyenne [name],Cheyenne [name]   [phone]        I met face to face with patient and family in  home. Palliative Care was asked to follow this patient by consultation request of  Latanya Maudlin, NP to address advance care planning and complex medical decision making. This is a follow up visit.                                   ASSESSMENT AND PLAN / RECOMMENDATIONS:   Advance Care Planning/Goals of Care: Goals include to maximize quality of life and symptom management. Patient/health care surrogate gave his/her permission to discuss. Our advance care planning conversation included a discussion about:      Experiences with loved ones who have been seriously ill or have died  Exploration of personal, cultural or spiritual beliefs that might influence medical decisions Discussed QoL after surgery  Identification of a healthcare agent - Cheyenne CODE STATUS: dnr, in home  Symptom Management/Plan:  PT; Has not heard from [place] yet, had [facility] prior and likes them. She may call [facility] and ask them to pick her up. We discussed importance of her exercising under direction for recovery and strengthening.  Nutrition: eating well, has food from friends coming in.   Today is her birthday and  she has cake coming.  She appears WNWD.  Mobility:  Getting around without device.  Feels stable in the home. Fall prevention  discussed. I advised using rollator thru recovery period.  Pain: Much improved s/p surgery.   Follow up Palliative Care Visit: Palliative care  home NP visiting program ending. Pt made aware.  I spent 40 minutes providing this consultation. More than 50% of the time in this consultation was spent in counseling and care coordination.  PPS: 40%  HOSPICE ELIGIBILITY/DIAGNOSIS: TBD \ Chief Complaint: debility, fall risk  HISTORY OF PRESENT ILLNESS:  Cheyenne [name] is a 88 y.o. year old female  with s/p cyst removal from spine, debility, fall risk .   History obtained from review of EMR, discussion with primary team, and interview with family, facility staff/caregiver and/or [name].  I reviewed available labs, medications, imaging, studies and related documents from the EMR.  Records reviewed and summarized above.   ROS   General: NAD EYES: denies vision changes, has glasses ENMT: denies dysphagia Pulmonary: denies cough, denies increased SOB Abdomen: endorses good appetite, denies constipation, endorses continence of bowel GU: denies dysuria, endorses continence of urine MSK:  denies increased weakness,  no falls reported Skin: denies rashes, endorses surgical wound Neurological: endorses improved LE  pain, denies insomnia Psych: Endorses positive mood Heme/lymph/immuno: denies bruises, abnormal bleeding  Physical Exam: Current and past weights: stable Constitutional:  NAD General: frail appearing, EYES: anicteric sclera, lids intact, no discharge  ENMT: intact hearing, oral mucous membranes moist, dentition intact CV: 1+ LE edema Pulmonary:no increased work of breathing, no cough, room air Abdomen: intake 80%, soft and non tender, no ascites GU: deferred MSK: mod  sarcopenia, moves all extremities, ambulatory Skin: warm and dry, no rashes,  lumbar surgical wound intact. Neuro:  no  new generalized weakness,  no cognitive impairment Psych: non-anxious affect, A and O x 3 Hem/lymph/immuno: no widespread bruising   Thank you for the opportunity to participate in the care of Cheyenne [name]. Please call our office at [phone] if we can be of additional assistance.   Jason Coop, NP   COVID-19 PATIENT SCREENING TOOL Asked and negative response unless otherwise noted:   Have you had symptoms of covid, tested positive or been in contact with someone with symptoms/positive test in the past 5-10 days?

## 2022-07-31 ENCOUNTER — Encounter: Payer: Medicare HMO | Admitting: Orthopedic Surgery

## 2022-07-31 ENCOUNTER — Telehealth: Payer: Self-pay

## 2022-07-31 NOTE — Telephone Encounter (Signed)
-----   Message from Espino sent at 07/31/2022  2:24 PM EST ----- Regarding: Hold Plavix Cherly Anderson with PT called and was checking for patient that she is to hold off on taking her Plavix for two weeks (until 11/22) as this is not something she remembers having to do in the past for other procedures/surgeries.   Please contact Cherly Anderson [phone] She will be with Ms. Darty for atleast the next hour and a half if someone was able to call her back in that time that would be great but if not that's fine.

## 2022-07-31 NOTE — Telephone Encounter (Signed)
I spoke with Cheyenne [name] and confirmed that Cheyenne [name] is supposed to hold her plavix and eliquis for 2 weeks from the date of surgery.  Cheyenne [name]'s # is [phone]*

## 2022-08-06 NOTE — Progress Notes (Unsigned)
   REFERRING PHYSICIAN:  Latanya Maudlin, [address],  [place] [address]  DOS: Left L4-5 lumbar decompression 07/25/22  HISTORY OF PRESENT ILLNESS: Cheyenne [name] is approximately 2 weeks status post Left L4-5 lumbar decompression. Was given norco 5 and robaxin on discharge from the hospital.   She is doing well. She is taking occasional robaxin and prn tylenol. Her preop leg pain is gone! She has intermittent buttock pain that is worse with prolonged sitting and in the morning.    PHYSICAL EXAMINATION:  General: Patient is well developed, well nourished, calm, collected, and in no apparent distress.   NEUROLOGICAL:  General: In no acute distress.   Awake, alert, oriented to person, place, and time.  Pupils equal round and reactive to light.  Facial tone is symmetric.     Strength:            Side Iliopsoas Quads Hamstring PF DF EHL  R '5 5 5 5 5 5  '$ L '5 5 5 5 5 5   '$ Incision c/d/i   ROS (Neurologic):  Negative except as noted above  IMAGING: Nothing new to review.   ASSESSMENT/PLAN:  Cheyenne [name] is doing well s/p above surgery. Treatment options reviewed with patient and following plan made:   - I have advised the patient to lift up to 10 pounds until 6 weeks after surgery (follow up with Dr. Izora Ribas).  - Reviewed wound care.  - No bending, twisting, or lifting.  - Continue on current medications including prn tylenol.  - Okay to restart plavix and eliquis tomorrow (2 weeks postop).  - BP initially 160/58 with no symptoms. Recheck at the end of the visit was 138/60.  - Follow up as scheduled in 4 weeks and prn.   Advised to contact the office if any questions or concerns arise.  Geronimo Boot PA-C Department of neurosurgery

## 2022-08-07 ENCOUNTER — Ambulatory Visit (INDEPENDENT_AMBULATORY_CARE_PROVIDER_SITE_OTHER): Payer: Medicare HMO | Admitting: Orthopedic Surgery

## 2022-08-07 ENCOUNTER — Encounter: Payer: Self-pay | Admitting: Orthopedic Surgery

## 2022-08-07 VITALS — BP 138/60 | Temp 97.6°F

## 2022-08-07 DIAGNOSIS — M5416 Radiculopathy, lumbar region: Secondary | ICD-10-CM

## 2022-08-07 DIAGNOSIS — Z09 Encounter for follow-up examination after completed treatment for conditions other than malignant neoplasm: Secondary | ICD-10-CM

## 2022-08-07 DIAGNOSIS — Z9889 Other specified postprocedural states: Secondary | ICD-10-CM

## 2022-08-13 ENCOUNTER — Telehealth: Payer: Self-pay

## 2022-08-13 NOTE — Telephone Encounter (Signed)
I spoke with Cheyenne [name] and gave her the verbal order for PT.

## 2022-08-13 NOTE — Telephone Encounter (Signed)
-----   Message from Peggyann Shoals sent at 08/13/2022  8:00 AM EST ----- Regarding: verbal order [facility] Contact: [phone] Voice mail 08/10/2022 post op left L4-5 laminoforaminotomy on 07/25/22 Raquel Sarna from [facility] Verbal order to extend PT 2 x 2 1 x 1

## 2022-08-15 ENCOUNTER — Telehealth: Payer: Self-pay

## 2022-08-15 NOTE — Telephone Encounter (Signed)
Ms Youngren called in complaining of left hip pain (in her butt cheek) when she walks. She states it feels different than the pain she had before surgery.  She had occasional pain there before surgery, but not like it is now.  Her leg pain is resolved.  She has tried methocarbamol '500mg'$  and it didn't make a difference She has tried tylenol, which helped a little bit She is still on pregabalin '150mg'$  BID She has tried a heating pad She has not tried any pain medication   Per discussion with Geronimo Boot, PA-C, she will try taking extra strength tylenol consistently 3 times a day and call us back for further direction if that doesn't help. She verbalized understanding.

## 2022-08-17 NOTE — Telephone Encounter (Signed)
Patient notified to take it for a week. She agreed.

## 2022-08-17 NOTE — Telephone Encounter (Signed)
How long should she be taking extra strength tylenol for? She knows it's not healthy to be taking it for along period of time. She states that it does help some.  [phone]

## 2022-08-17 NOTE — Telephone Encounter (Signed)
Cheyenne [name] said try it for a week and then see how she feels

## 2022-08-28 ENCOUNTER — Ambulatory Visit (INDEPENDENT_AMBULATORY_CARE_PROVIDER_SITE_OTHER): Payer: Medicare HMO | Admitting: Neurosurgery

## 2022-08-28 ENCOUNTER — Encounter: Payer: Self-pay | Admitting: Neurosurgery

## 2022-08-28 VITALS — BP 136/76 | Temp 97.9°F | Ht 61.0 in | Wt 130.8 lb

## 2022-08-28 DIAGNOSIS — Z09 Encounter for follow-up examination after completed treatment for conditions other than malignant neoplasm: Secondary | ICD-10-CM

## 2022-08-28 DIAGNOSIS — M5416 Radiculopathy, lumbar region: Secondary | ICD-10-CM

## 2022-08-28 NOTE — Progress Notes (Signed)
   REFERRING PHYSICIAN:  Latanya Maudlin, [address] [place],  [place] [address]  DOS: Left L4-5 lumbar decompression 07/25/22  HISTORY OF PRESENT ILLNESS: Cheyenne [name] is status post Left L4-5 lumbar decompression.  She is doing very well.  She has minimal pain.   PHYSICAL EXAMINATION:  General: Patient is well developed, well nourished, calm, collected, and in no apparent distress.   NEUROLOGICAL:  General: In no acute distress.   Awake, alert, oriented to person, place, and time.  Pupils equal round and reactive to light.  Facial tone is symmetric.     Strength:            Side Iliopsoas Quads Hamstring PF DF EHL  R '5 5 5 5 5 5  '$ L '5 5 5 5 5 5   '$ Incision c/d/i   ROS (Neurologic):  Negative except as noted above  IMAGING: Nothing new to review.   ASSESSMENT/PLAN:  Cheyenne [name] is doing well s/p above surgery.  We reviewed her activity limitations.  She is now on a 25 pound lifting limit.  I will see her back in 6 weeks.  Meade Maw MD Department of neurosurgery

## 2022-09-28 ENCOUNTER — Other Ambulatory Visit: Payer: Medicare HMO

## 2022-09-28 DIAGNOSIS — Z515 Encounter for palliative care: Secondary | ICD-10-CM

## 2022-09-28 NOTE — Progress Notes (Signed)
PC SW connected with patient to complete telephonic check in on patient and review new PC criteria. Patient shared that she is doing very well and did not feel the need for PC services at this time. Patient does not have a cancer dx or ready for hospice. SW advised patient that should patient require services ion the future to outreach PC.    Patient will be discharged from [facility] at this time.

## 2022-10-03 DIAGNOSIS — I5043 Acute on chronic combined systolic (congestive) and diastolic (congestive) heart failure: Secondary | ICD-10-CM | POA: Diagnosis not present

## 2022-10-03 DIAGNOSIS — M48061 Spinal stenosis, lumbar region without neurogenic claudication: Secondary | ICD-10-CM | POA: Diagnosis not present

## 2022-10-03 DIAGNOSIS — I2510 Atherosclerotic heart disease of native coronary artery without angina pectoris: Secondary | ICD-10-CM | POA: Diagnosis not present

## 2022-10-03 DIAGNOSIS — F4321 Adjustment disorder with depressed mood: Secondary | ICD-10-CM | POA: Diagnosis not present

## 2022-10-03 DIAGNOSIS — N184 Chronic kidney disease, stage 4 (severe): Secondary | ICD-10-CM | POA: Diagnosis not present

## 2022-10-03 DIAGNOSIS — I480 Paroxysmal atrial fibrillation: Secondary | ICD-10-CM | POA: Diagnosis not present

## 2022-10-07 DIAGNOSIS — M810 Age-related osteoporosis without current pathological fracture: Secondary | ICD-10-CM | POA: Diagnosis not present

## 2022-10-07 DIAGNOSIS — I4891 Unspecified atrial fibrillation: Secondary | ICD-10-CM | POA: Diagnosis not present

## 2022-10-07 DIAGNOSIS — N1832 Chronic kidney disease, stage 3b: Secondary | ICD-10-CM | POA: Diagnosis not present

## 2022-10-07 DIAGNOSIS — M1990 Unspecified osteoarthritis, unspecified site: Secondary | ICD-10-CM | POA: Diagnosis not present

## 2022-10-07 DIAGNOSIS — R32 Unspecified urinary incontinence: Secondary | ICD-10-CM | POA: Diagnosis not present

## 2022-10-07 DIAGNOSIS — F419 Anxiety disorder, unspecified: Secondary | ICD-10-CM | POA: Diagnosis not present

## 2022-10-07 DIAGNOSIS — K08409 Partial loss of teeth, unspecified cause, unspecified class: Secondary | ICD-10-CM | POA: Diagnosis not present

## 2022-10-07 DIAGNOSIS — E1151 Type 2 diabetes mellitus with diabetic peripheral angiopathy without gangrene: Secondary | ICD-10-CM | POA: Diagnosis not present

## 2022-10-07 DIAGNOSIS — M5430 Sciatica, unspecified side: Secondary | ICD-10-CM | POA: Diagnosis not present

## 2022-10-07 DIAGNOSIS — K219 Gastro-esophageal reflux disease without esophagitis: Secondary | ICD-10-CM | POA: Diagnosis not present

## 2022-10-07 DIAGNOSIS — I129 Hypertensive chronic kidney disease with stage 1 through stage 4 chronic kidney disease, or unspecified chronic kidney disease: Secondary | ICD-10-CM | POA: Diagnosis not present

## 2022-10-07 DIAGNOSIS — R269 Unspecified abnormalities of gait and mobility: Secondary | ICD-10-CM | POA: Diagnosis not present

## 2022-10-07 DIAGNOSIS — D6869 Other thrombophilia: Secondary | ICD-10-CM | POA: Diagnosis not present

## 2022-10-07 DIAGNOSIS — I509 Heart failure, unspecified: Secondary | ICD-10-CM | POA: Diagnosis not present

## 2022-10-07 DIAGNOSIS — M4800 Spinal stenosis, site unspecified: Secondary | ICD-10-CM | POA: Diagnosis not present

## 2022-10-07 DIAGNOSIS — Z7901 Long term (current) use of anticoagulants: Secondary | ICD-10-CM | POA: Diagnosis not present

## 2022-10-07 DIAGNOSIS — I130 Hypertensive heart and chronic kidney disease with heart failure and stage 1 through stage 4 chronic kidney disease, or unspecified chronic kidney disease: Secondary | ICD-10-CM | POA: Diagnosis not present

## 2022-10-07 DIAGNOSIS — Z8249 Family history of ischemic heart disease and other diseases of the circulatory system: Secondary | ICD-10-CM | POA: Diagnosis not present

## 2022-10-07 DIAGNOSIS — J45909 Unspecified asthma, uncomplicated: Secondary | ICD-10-CM | POA: Diagnosis not present

## 2022-10-07 DIAGNOSIS — I252 Old myocardial infarction: Secondary | ICD-10-CM | POA: Diagnosis not present

## 2022-10-07 DIAGNOSIS — E1122 Type 2 diabetes mellitus with diabetic chronic kidney disease: Secondary | ICD-10-CM | POA: Diagnosis not present

## 2022-10-07 DIAGNOSIS — I25119 Atherosclerotic heart disease of native coronary artery with unspecified angina pectoris: Secondary | ICD-10-CM | POA: Diagnosis not present

## 2022-10-09 ENCOUNTER — Encounter: Payer: Medicare HMO | Admitting: Neurosurgery

## 2022-10-16 IMAGING — CT CT HEAD W/O CM
4 series · 17 of 47 positions shown, 19 images · non-contrast
Comparison: Imaging from Tuesday June, 2008.

CLINICAL DATA: A [AGE] female presents for evaluation of
neuro deficit.



[Series 2: head wo · axial · 0.40mm/px · z∈[+321,+421]mm · 6 of 28 slices shown, 8 images]
[im 4/28  brain]
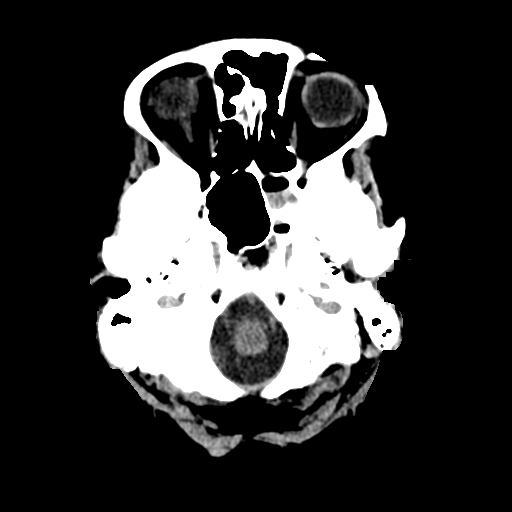
[im 4/28  bone]
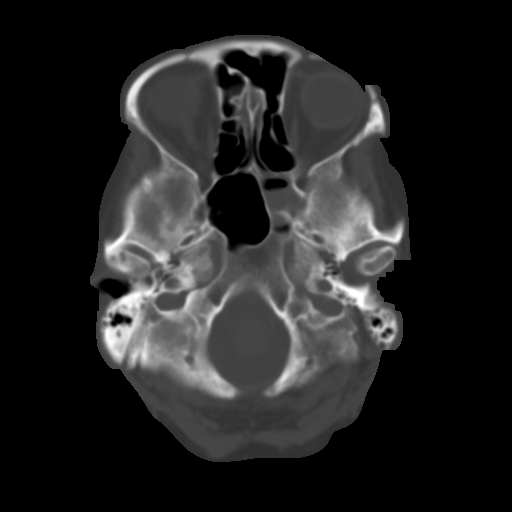
[im 8/28  brain]
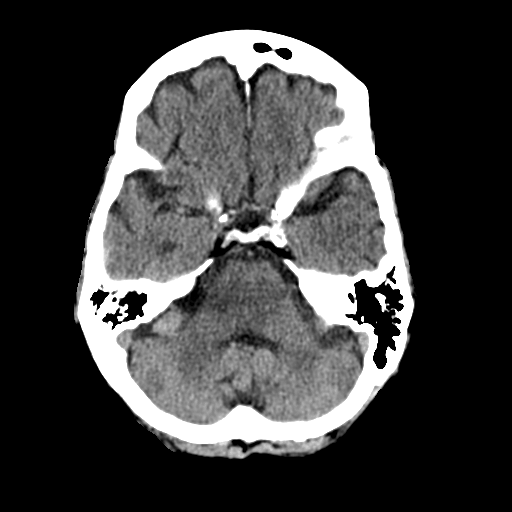
[im 12/28  brain]
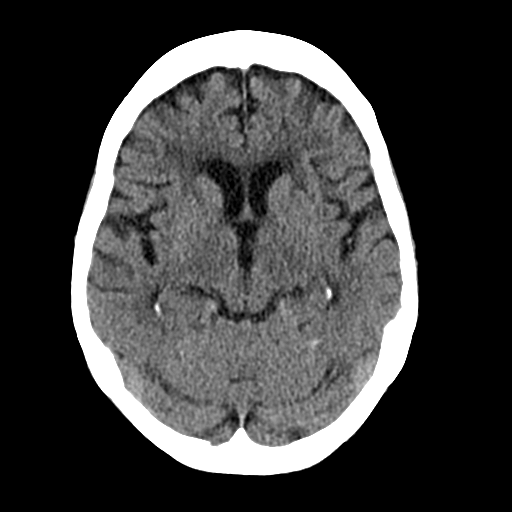
[im 16/28  brain]
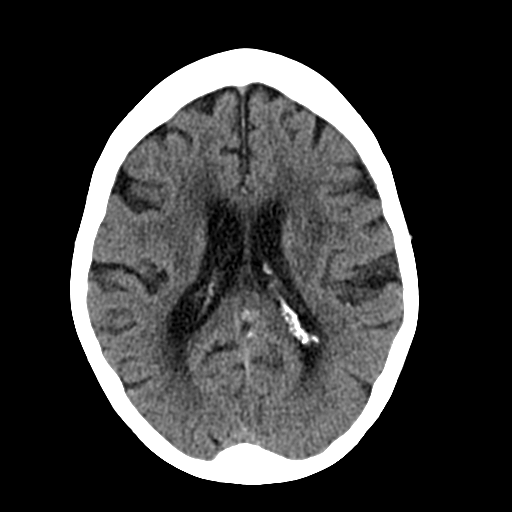
[im 20/28  brain]
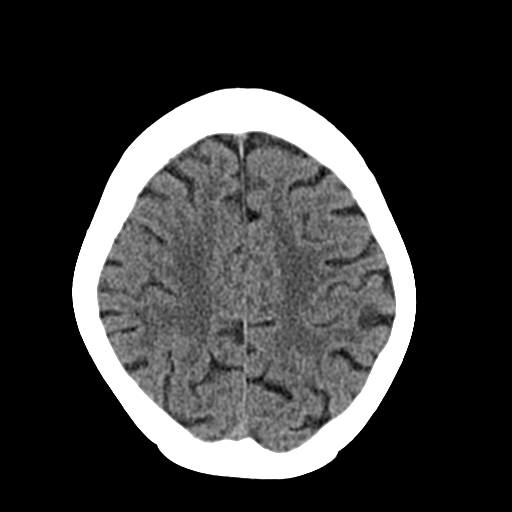
[im 20/28  bone]
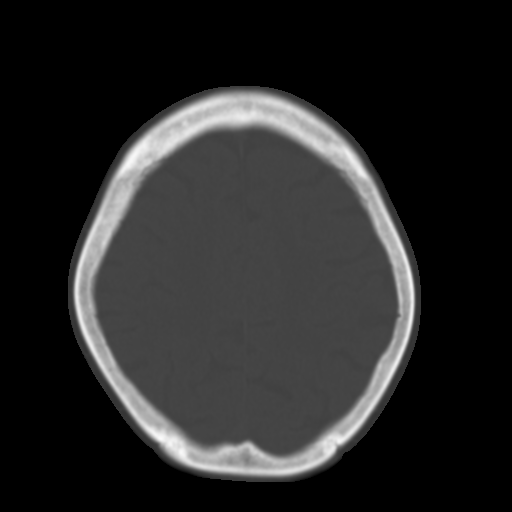
[im 24/28  brain]
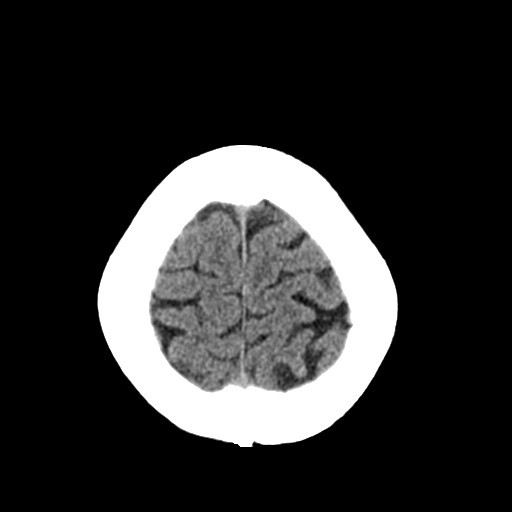

[Series 3: head bone · axial · 0.40mm/px · z∈[+318,+388]mm · 5 of 73 slices shown]
[im 7/73  bone]
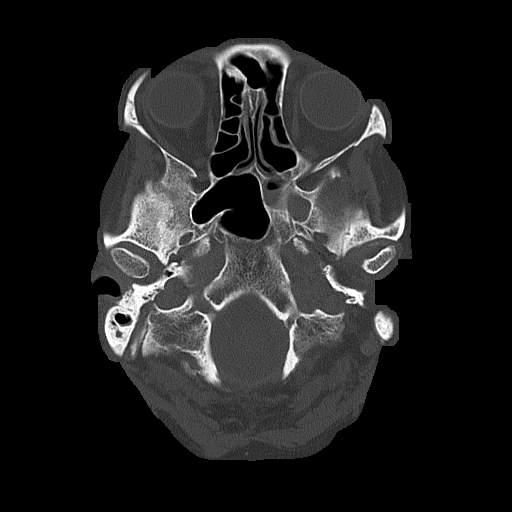
[im 14/73  bone]
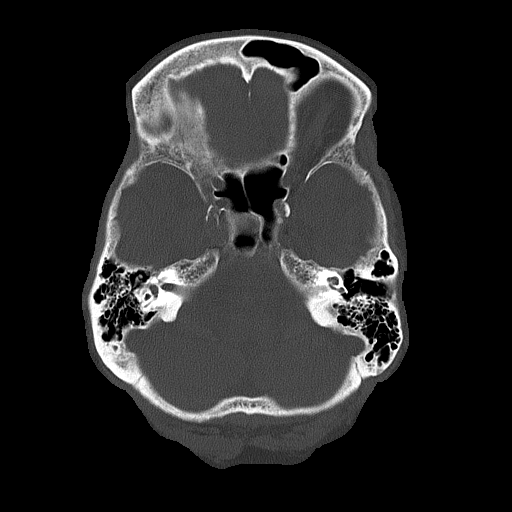
[im 25/73  bone]
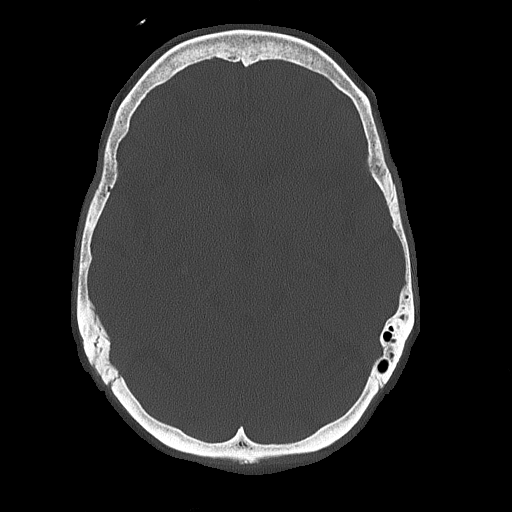
[im 31/73  bone]
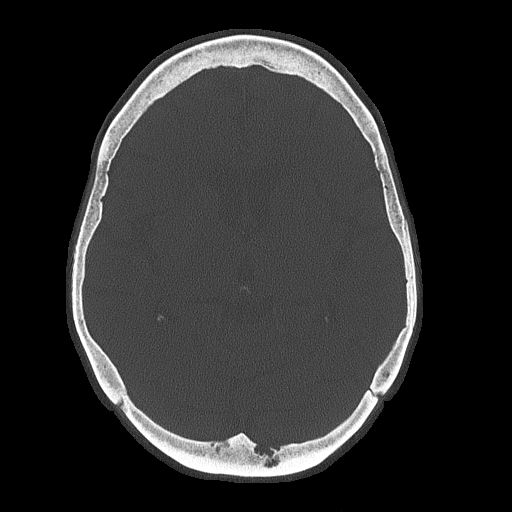
[im 42/73  bone]
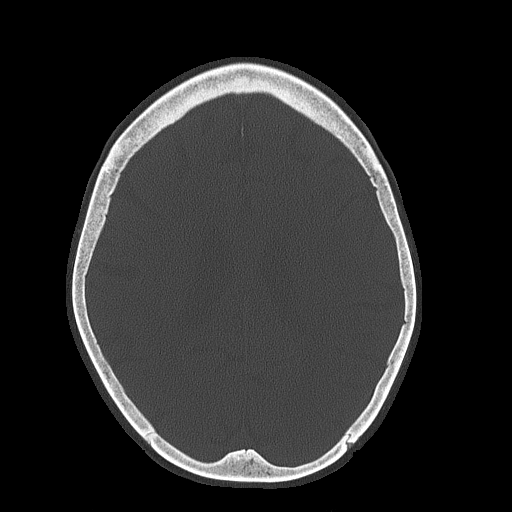

[Series 4: coronal soft tissue · coronal · 0.29mm/px · 3 of 63 slices shown]
[im 21/63  brain]
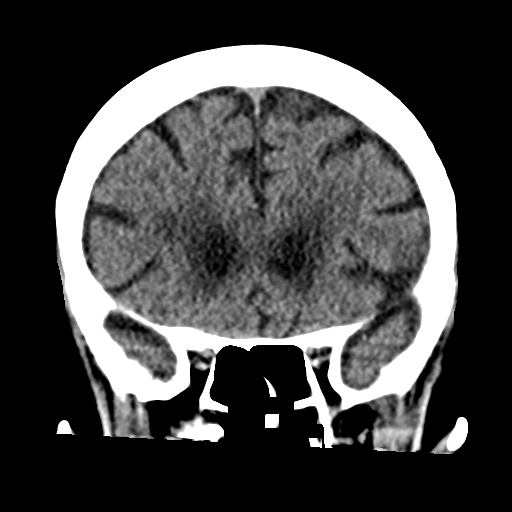
[im 28/63  brain]
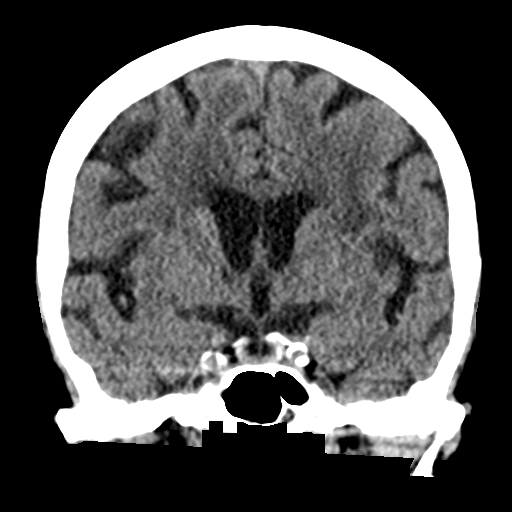
[im 35/63  brain]
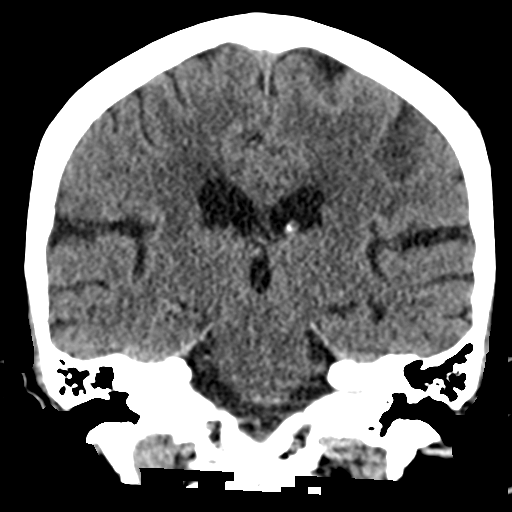

[Series 5: sagittal soft tissue · sagittal · 0.31mm/px · 3 of 49 slices shown]
[im 17/49  brain]
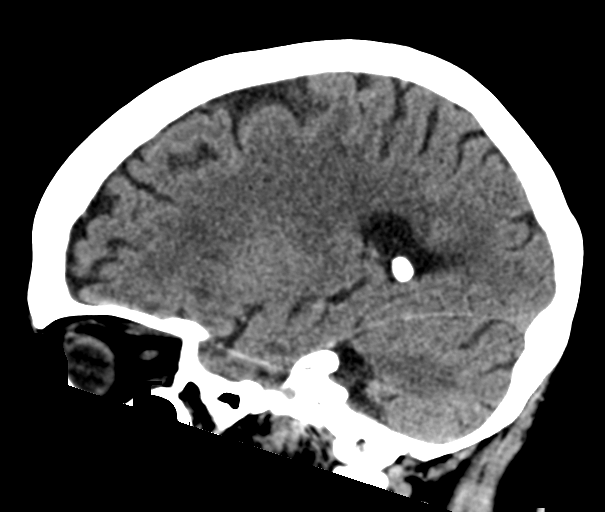
[im 25/49  brain]
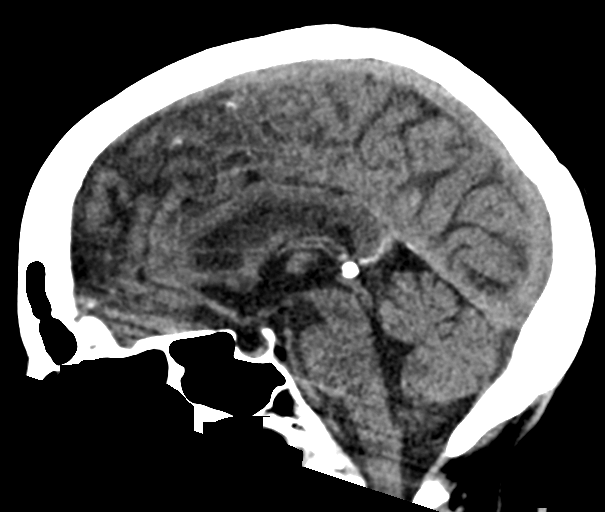
[im 33/49  brain]
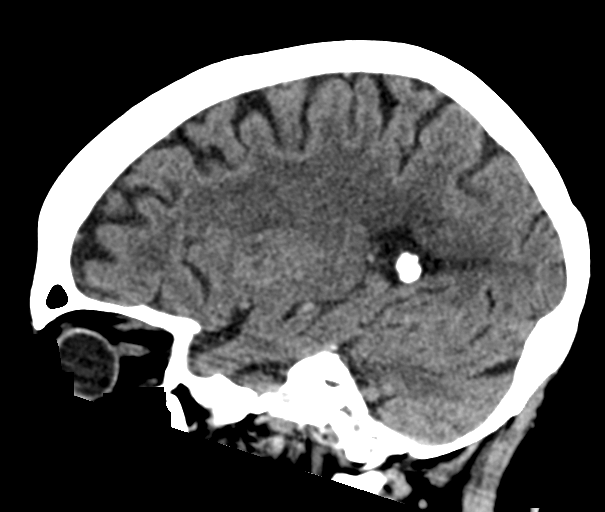

[17 of 47 positions shown; findings below may reference images not displayed]

FINDINGS: Brain: No evidence of acute infarction, hemorrhage, hydrocephalus,
extra-axial collection or mass lesion/mass effect. Signs of atrophy
with evidence of chronic microvascular ischemic change in deep white
matter.

Vascular: No hyperdense vessel or unexpected calcification.

Skull: Normal. Negative for fracture or focal lesion.

Sinuses/Orbits: Mild LEFT sphenoid sinus disease with chronic
appearance. Visualize paranasal sinuses and orbits are otherwise
unremarkable to the extent evaluated.

Other: None.
IMPRESSION: 1. No acute intracranial abnormality.
2. Signs of atrophy with evidence of chronic microvascular ischemic
change in deep white matter.
3. Mild LEFT sphenoid sinus disease with chronic appearance.

## 2022-10-18 ENCOUNTER — Ambulatory Visit (INDEPENDENT_AMBULATORY_CARE_PROVIDER_SITE_OTHER): Payer: Medicare HMO | Admitting: Neurosurgery

## 2022-10-18 ENCOUNTER — Encounter: Payer: Self-pay | Admitting: Neurosurgery

## 2022-10-18 VITALS — BP 130/60 | Temp 97.8°F | Ht 61.0 in | Wt 135.2 lb

## 2022-10-18 DIAGNOSIS — Z09 Encounter for follow-up examination after completed treatment for conditions other than malignant neoplasm: Secondary | ICD-10-CM

## 2022-10-18 DIAGNOSIS — M5416 Radiculopathy, lumbar region: Secondary | ICD-10-CM

## 2022-10-18 NOTE — Progress Notes (Signed)
   REFERRING PHYSICIAN:  Latanya Maudlin, [address],  [place] [address]  DOS: Left L4-5 lumbar decompression 07/25/22  HISTORY OF PRESENT ILLNESS: NATONYA FINSTAD is status post Left L4-5 lumbar decompression.  She is doing very well.  She has some pain at times that improves when she sits.   PHYSICAL EXAMINATION:  General: Patient is well developed, well nourished, calm, collected, and in no apparent distress.   NEUROLOGICAL:  General: In no acute distress.   Awake, alert, oriented to person, place, and time.  Pupils equal round and reactive to light.  Facial tone is symmetric.     Strength:            Side Iliopsoas Quads Hamstring PF DF EHL  R '5 5 5 5 5 5  '$ L '5 5 5 5 5 5   '$ Incision c/d/i   ROS (Neurologic):  Negative except as noted above  IMAGING: Nothing new to review.   ASSESSMENT/PLAN:  CINDA HARA is doing well s/p above surgery.  We reviewed her activity limitations.    She is off limitations.  She has some minor symptoms, but not nearly as severe as she did prior to surgery.  We discussed that pain 1-3 out of 10 is not too worrisome.  If her pain worsens, I will be happy to reevaluate her and consider injections.    I will see her on an as-needed basis.  Meade Maw MD Department of neurosurgery

## 2022-11-12 DIAGNOSIS — N184 Chronic kidney disease, stage 4 (severe): Secondary | ICD-10-CM | POA: Diagnosis not present

## 2022-11-12 DIAGNOSIS — F4321 Adjustment disorder with depressed mood: Secondary | ICD-10-CM | POA: Diagnosis not present

## 2022-11-12 DIAGNOSIS — M48061 Spinal stenosis, lumbar region without neurogenic claudication: Secondary | ICD-10-CM | POA: Diagnosis not present

## 2022-11-12 DIAGNOSIS — I5043 Acute on chronic combined systolic (congestive) and diastolic (congestive) heart failure: Secondary | ICD-10-CM | POA: Diagnosis not present

## 2022-11-12 DIAGNOSIS — I480 Paroxysmal atrial fibrillation: Secondary | ICD-10-CM | POA: Diagnosis not present

## 2022-11-12 DIAGNOSIS — I2510 Atherosclerotic heart disease of native coronary artery without angina pectoris: Secondary | ICD-10-CM | POA: Diagnosis not present

## 2022-11-21 DIAGNOSIS — I2510 Atherosclerotic heart disease of native coronary artery without angina pectoris: Secondary | ICD-10-CM | POA: Diagnosis not present

## 2022-11-21 DIAGNOSIS — Z0000 Encounter for general adult medical examination without abnormal findings: Secondary | ICD-10-CM | POA: Diagnosis not present

## 2022-11-21 DIAGNOSIS — R9431 Abnormal electrocardiogram [ECG] [EKG]: Secondary | ICD-10-CM | POA: Diagnosis not present

## 2022-11-21 DIAGNOSIS — N184 Chronic kidney disease, stage 4 (severe): Secondary | ICD-10-CM | POA: Diagnosis not present

## 2022-11-21 DIAGNOSIS — F419 Anxiety disorder, unspecified: Secondary | ICD-10-CM | POA: Diagnosis not present

## 2022-11-21 DIAGNOSIS — I5042 Chronic combined systolic (congestive) and diastolic (congestive) heart failure: Secondary | ICD-10-CM | POA: Diagnosis not present

## 2022-11-21 DIAGNOSIS — I739 Peripheral vascular disease, unspecified: Secondary | ICD-10-CM | POA: Diagnosis not present

## 2022-11-21 DIAGNOSIS — E7800 Pure hypercholesterolemia, unspecified: Secondary | ICD-10-CM | POA: Diagnosis not present

## 2022-11-21 DIAGNOSIS — I480 Paroxysmal atrial fibrillation: Secondary | ICD-10-CM | POA: Diagnosis not present

## 2022-11-21 DIAGNOSIS — I130 Hypertensive heart and chronic kidney disease with heart failure and stage 1 through stage 4 chronic kidney disease, or unspecified chronic kidney disease: Secondary | ICD-10-CM | POA: Diagnosis not present

## 2022-11-21 DIAGNOSIS — F4321 Adjustment disorder with depressed mood: Secondary | ICD-10-CM | POA: Diagnosis not present

## 2022-11-21 DIAGNOSIS — Z1331 Encounter for screening for depression: Secondary | ICD-10-CM | POA: Diagnosis not present

## 2022-11-21 DIAGNOSIS — Z95820 Peripheral vascular angioplasty status with implants and grafts: Secondary | ICD-10-CM | POA: Diagnosis not present

## 2022-11-21 DIAGNOSIS — R7303 Prediabetes: Secondary | ICD-10-CM | POA: Diagnosis not present

## 2022-11-22 DIAGNOSIS — N184 Chronic kidney disease, stage 4 (severe): Secondary | ICD-10-CM | POA: Diagnosis not present

## 2022-11-22 DIAGNOSIS — F4321 Adjustment disorder with depressed mood: Secondary | ICD-10-CM | POA: Diagnosis not present

## 2022-11-22 DIAGNOSIS — M48061 Spinal stenosis, lumbar region without neurogenic claudication: Secondary | ICD-10-CM | POA: Diagnosis not present

## 2022-11-22 DIAGNOSIS — I5043 Acute on chronic combined systolic (congestive) and diastolic (congestive) heart failure: Secondary | ICD-10-CM | POA: Diagnosis not present

## 2022-11-22 DIAGNOSIS — I480 Paroxysmal atrial fibrillation: Secondary | ICD-10-CM | POA: Diagnosis not present

## 2022-11-22 DIAGNOSIS — I2510 Atherosclerotic heart disease of native coronary artery without angina pectoris: Secondary | ICD-10-CM | POA: Diagnosis not present

## 2023-01-17 ENCOUNTER — Other Ambulatory Visit: Payer: Self-pay

## 2023-01-17 ENCOUNTER — Emergency Department: Payer: Medicare HMO

## 2023-01-17 ENCOUNTER — Encounter: Payer: Self-pay | Admitting: Emergency Medicine

## 2023-01-17 ENCOUNTER — Emergency Department
Admission: EM | Admit: 2023-01-17 | Discharge: 2023-01-17 | Disposition: A | Discharge status: Home / Self Care | Payer: Medicare HMO | Attending: Emergency Medicine | Admitting: Emergency Medicine

## 2023-01-17 DIAGNOSIS — J45909 Unspecified asthma, uncomplicated: Secondary | ICD-10-CM | POA: Insufficient documentation

## 2023-01-17 DIAGNOSIS — I5022 Chronic systolic (congestive) heart failure: Secondary | ICD-10-CM | POA: Diagnosis not present

## 2023-01-17 DIAGNOSIS — R7989 Other specified abnormal findings of blood chemistry: Secondary | ICD-10-CM | POA: Diagnosis not present

## 2023-01-17 DIAGNOSIS — R079 Chest pain, unspecified: Secondary | ICD-10-CM | POA: Insufficient documentation

## 2023-01-17 DIAGNOSIS — R0602 Shortness of breath: Secondary | ICD-10-CM | POA: Diagnosis not present

## 2023-01-17 DIAGNOSIS — I2510 Atherosclerotic heart disease of native coronary artery without angina pectoris: Secondary | ICD-10-CM | POA: Insufficient documentation

## 2023-01-17 DIAGNOSIS — R0789 Other chest pain: Secondary | ICD-10-CM | POA: Diagnosis not present

## 2023-01-17 DIAGNOSIS — R5381 Other malaise: Secondary | ICD-10-CM | POA: Diagnosis not present

## 2023-01-17 DIAGNOSIS — R5383 Other fatigue: Secondary | ICD-10-CM | POA: Diagnosis not present

## 2023-01-17 DIAGNOSIS — R0609 Other forms of dyspnea: Secondary | ICD-10-CM | POA: Diagnosis not present

## 2023-01-17 DIAGNOSIS — Z853 Personal history of malignant neoplasm of breast: Secondary | ICD-10-CM | POA: Diagnosis not present

## 2023-01-17 DIAGNOSIS — I130 Hypertensive heart and chronic kidney disease with heart failure and stage 1 through stage 4 chronic kidney disease, or unspecified chronic kidney disease: Secondary | ICD-10-CM | POA: Diagnosis not present

## 2023-01-17 DIAGNOSIS — R600 Localized edema: Secondary | ICD-10-CM | POA: Diagnosis not present

## 2023-01-17 DIAGNOSIS — N184 Chronic kidney disease, stage 4 (severe): Secondary | ICD-10-CM | POA: Diagnosis not present

## 2023-01-17 LAB — CBC
HCT: 33.3 % — ABNORMAL LOW (ref 36.0–46.0)
Hemoglobin: 11 g/dL — ABNORMAL LOW (ref 12.0–15.0)
MCH: 31.8 pg (ref 26.0–34.0)
MCHC: 33 g/dL (ref 30.0–36.0)
MCV: 96.2 fL (ref 80.0–100.0)
Platelets: 151 10*3/uL (ref 150–400)
RBC: 3.46 MIL/uL — ABNORMAL LOW (ref 3.87–5.11)
RDW: 14.7 % (ref 11.5–15.5)
WBC: 6.3 10*3/uL (ref 4.0–10.5)
nRBC: 0 % (ref 0.0–0.2)

## 2023-01-17 LAB — BASIC METABOLIC PANEL
Anion gap: 10 (ref 5–15)
BUN: 41 mg/dL — ABNORMAL HIGH (ref 8–23)
CO2: 23 mmol/L (ref 22–32)
Calcium: 9.2 mg/dL (ref 8.9–10.3)
Chloride: 106 mmol/L (ref 98–111)
Creatinine, Ser: 1.91 mg/dL — ABNORMAL HIGH (ref 0.44–1.00)
GFR, Estimated: 24 mL/min — ABNORMAL LOW (ref >60)
Glucose, Bld: 88 mg/dL (ref 70–99)
Potassium: 4.8 mmol/L (ref 3.5–5.1)
Sodium: 139 mmol/L (ref 135–145)

## 2023-01-17 LAB — TROPONIN I (HIGH SENSITIVITY)
Troponin I (High Sensitivity): 13 ng/L (ref <18)
Troponin I (High Sensitivity): 11 ng/L (ref <18)

## 2023-01-17 LAB — BRAIN NATRIURETIC PEPTIDE: B Natriuretic Peptide: 128.7 pg/mL — ABNORMAL HIGH (ref 0.0–100.0)

## 2023-01-17 NOTE — ED Provider Notes (Signed)
Care assumed of patient from outgoing provider.  See their note for initial history, exam and plan.  Clinical Course as of 01/17/23 1510  Thu Jan 17, 2023  1509 Patient presented to the emergency department with shortness of breath and chest pain that had resolved.  Worse at nighttime last night but no shortness of breath today.  Normal ambulatory oxygen saturation.  Initial troponin was negative.  EKG with known bundle branch block.  No signs of volume overload.  Mildly elevated BNP.  Plan for serial troponin and discharge home if second troponin is stable.  Plan to follow-up tomorrow with cardiology. [SM]    Clinical Course User Index [SM] Corena Herter, MD     Corena Herter, MD 01/17/23 [phone]

## 2023-01-17 NOTE — ED Notes (Signed)
First Nurse Note: Patient sent to ED via POV from PCP. NP concerned for CHF exacerbation. Patient states some chest pain and SOB. Pain on left side.

## 2023-01-17 NOTE — ED Provider Notes (Signed)
[facility] Provider Note    Event Date/Time   First MD Initiated Contact with Patient 01/17/23 1404     (approximate)   History   Chest Pain   HPI  Cheyenne [name] is a 89 y.o. female with past medical history of atrial fibrillation not anticoagulated, peripheral vascular disease, coronary artery disease, hypertension, CKD who presents with dyspnea.  Patient tells me she has been feeling short of breath for about a week and a half maybe longer.  She notices it when she talks a lot or when she is exerting herself.  She did have pressure in her chest last night that was worse when she was lying down and tells me that she has not had any chest pain today or throughout the week.  Denies exertional chest pain or chest pain with her dyspnea.  Denies cough fevers chills but does endorse significant sinus congestion and she thinks that is contributing to her dyspnea today.  She did think that her legs were more swollen than normal.  Tells me she typically has left leg swelling but that the right leg seem to be more swollen today.     Past Medical History:  Diagnosis Date   A-fib [facility])    a.) CHA2DS2VASc = 6 (age x 2, sex, HFrEF, HTN, NSTEMI);  b.) rate/rhythm maintained on oral metoprolol succinate; chronically anticoagulated with apixaban + clopidogrel   Allergic rhinitis    Anemia    Anxiety    Arthritis    Asthma    Breast cancer, right (HCC) 1995   a.) s/p surgical resection (mastectomy) + chemotherapy + 5 years endocrine therapy (tamoxifen)   CAD (coronary artery disease)    a.) LHC 09/15/2021: 90% mLAD, 70% D1, 99% pLCx, 95% mRCA.   Carpal tunnel syndrome    Cervical radiculopathy due to degenerative joint disease of spine    Chronic kidney disease (CKD), stage IV (severe) (HCC)    Chronic use of opiate for therapeutic purpose    a.) has naloxone Rx in place   Depression    Diverticulitis    Diverticulosis    DOE (dyspnea on exertion)    Followed by  palliative care service    GERD (gastroesophageal reflux disease)    H. pylori infection 06/2005   H/O bilateral cataract extraction 12/2005   HFrEF (heart failure with reduced ejection fraction) (HCC) 09/14/2021   a.) newly Dx'd. EF 35%, glob HK, sep/inf/post AK, mild LAE, triv AR/PR, mild MR/TR, GLS -9.1%, G2DD; b.) TTE 02/14/2022: EF 60-65%, mild-mod MR, G1DD   History of spinal fracture 1990   a.) T7 and T12 fractures - traumatic sequela of a mechanical fall   HLD (hyperlipidemia)    HTN (hypertension)    Impaired glucose tolerance    LBBB (left bundle branch block)    Long term current use of anticoagulant    a.) dose reduced apixaban   Long term current use of antithrombotics/antiplatelets    a.) clopidogrel   Lumbago    Lumbar radiculopathy    Macular degeneration    NSTEMI (non-ST elevated myocardial infarction) (HCC) 09/13/2021   a.) LHC/PCI 09/15/2021: 90% mLAD (plans for staged PCI), 70% D1, 99% pLCx (2.75 x 30 mm Resolute Onyx Frontier DES), 95% mRCA (3.5 x 15 mm Resolute Onyx Frontier DES); b.) staged PCI 09/19/2021 - PTCA/DES x1 (unknown type) to mLAD   Osteoporosis    Ovarian cyst    Paresthesia of both hands    Peripheral edema  PVD (peripheral vascular disease) (HCC)    Vitamin D deficiency     Patient Active Problem List   Diagnosis Date Noted   Spondylolisthesis of lumbar region 07/25/2022   Lumbar radiculopathy 07/25/2022   Intractable nausea and vomiting 02/14/2022   Elevated troponin 02/14/2022   Paroxysmal atrial fibrillation (HCC) 02/14/2022   Metabolic acidosis 02/14/2022   Chronic kidney disease, stage 3b (HCC) 02/14/2022   Prolonged QT interval 02/14/2022   Sciatica neuralgia 02/14/2022   Hyponatremia 02/13/2022     Physical Exam  Triage Vital Signs: ED Triage Vitals  Enc Vitals Group     BP 01/17/23 1309 (!) 146/53     Pulse Rate 01/17/23 1309 61     Resp 01/17/23 1309 16     Temp 01/17/23 1309 98.2 F (36.8 C)     Temp src --       SpO2 01/17/23 1309 96 %     Weight 01/17/23 1429 135 lb 2.3 oz (61.3 kg)     Height 01/17/23 1429 5\' 1"  (1.549 m)     Head Circumference --      Peak Flow --      Pain Score 01/17/23 1235 5     Pain Loc --      Pain Edu? --      Excl. in GC? --     Most recent vital signs: Vitals:   01/17/23 1410 01/17/23 1425  BP:  138/61  Pulse: 61 64  Resp: 15 (!) 21  Temp:    SpO2: 98% 96%     General: Awake, no distress.  CV:  Good peripheral perfusion.  No pitting edema legs look symmetric Resp:  Normal effort.  Work of breathing Abd:  No distention.  Neuro:             Awake, Alert, Oriented x 3  Other:     ED Results / Procedures / Treatments  Labs (all labs ordered are listed, but only abnormal results are displayed) Labs Reviewed  BASIC METABOLIC PANEL - Abnormal; Notable for the following components:      Result Value   BUN 41 (*)    Creatinine, Ser 1.91 (*)    GFR, Estimated 24 (*)    All other components within normal limits  CBC - Abnormal; Notable for the following components:   RBC 3.46 (*)    Hemoglobin 11.0 (*)    HCT 33.3 (*)    All other components within normal limits  BRAIN NATRIURETIC PEPTIDE - Abnormal; Notable for the following components:   B Natriuretic Peptide 128.7 (*)    All other components within normal limits  TROPONIN I (HIGH SENSITIVITY)  TROPONIN I (HIGH SENSITIVITY)     EKG  EKG reviewed and interpreted by myself shows a left bundle branch block without acute ischemic changes, no Sgarbossa criteria to suggest acute ischemia   RADIOLOGY I reviewed and interpreted the CXR which does not show any acute cardiopulmonary process    PROCEDURES:  Critical Care performed: No  .1-3 Lead EKG Interpretation  Performed by: Georga Hacking, MD Authorized by: Georga Hacking, MD     Interpretation: normal     ECG rate assessment: normal     Rhythm: sinus rhythm     Ectopy: none     Conduction: normal     The patient is on the  cardiac monitor to evaluate for evidence of arrhythmia and/or significant heart rate changes.   MEDICATIONS ORDERED IN ED: Medications - No  data to display   IMPRESSION / MDM / ASSESSMENT AND PLAN / ED COURSE  I reviewed the triage vital signs and the nursing notes.                              Patient's presentation is most consistent with acute presentation with potential threat to life or bodily function.  Differential diagnosis includes, but is not limited to, CHF exacerbation, unstable angina, pneumonia, PE, symptomatic anemia  The patient is a 89 year old female with past medical history of CHF, last EF in our system 60 to 65%, coronary artery disease peripheral vascular disease who presents because of dyspnea on exertion for about a week and a half.  Initial triage complaint was for chest pain but patient tells me that she only had chest pain last night while laying in bed has not had exertional chest pain accompanying her dyspnea.  No chest pain today.  She does feel like her sinuses have been draining and is having some postnasal drip but no significant cough.  No fevers or chills.  Patient was also concerned about lower extremity swelling.  Tells me her left lower extremity is typically swollen but the right is not but the right seems to be more swollen of the last several days.  She did take an extra dose of Lasix yesterday and swelling seems to have gone down.  Apparently saw her primary care doctor today who  Referred her to the ER. Patient's vital signs are notable for hypertension but are otherwise reassuring she is not hypoxic.  On exam she is not in any respiratory distress.  Lung sounds are clear I do not appreciate any crackles.  I think that her lower extremities looks symmetric and she really does not have pitting edema.  She tells me that she feels like the right lower extremity swelling has improved today.  It is nontender to palpation. Patient's chest x-ray is clear there  is no pulmonary edema does have cardiomegaly.  First troponin is negative.  Creatinine is somewhat up today 1.9 from baseline of around 1.  BNP mildly elevated at 128.  Hemoglobin is stable.  Patient's EKG shows a left bundle branch block.  Initial EKG with some artifact in lead I and lead II versus possible ST depression.  Did repeat and there is no significant ST depression.  No Sgarbossa criteria to suggest ischemia.  We did ambulate the patient she had no significant dyspnea no hypoxia.  Consider diagnosis of pulmonary embolism especially with the patient's report of swelling however she is not tachycardic not hypoxic and is not having any ongoing chest pain and with a week and a half of symptoms feel this is less likely.  Patient has AKI today and thus would not be able to get a CTA anyway and given my suspicion is low do not feel that she needs workup for this at this time.  Patient's lungs are clear chest x-ray is not suggestive of pulmonary edema and there is really not much pitting edema, I did not diurese her because of the AKI.  Recommend she go back to Lasix daily and follow-up with both cardiology and primary care.  Recommend she have a repeat BMP done in about 1 week.  Patient is actually seeing her cardiologist tomorrow as I feel comfortable letting her go home.  Patient signed out to oncoming rider pending repeat troponin.  If still negative will discharge.  Clinical Course as of 01/17/23 1518  Thu Jan 17, 2023  1509 Patient presented to the emergency department with shortness of breath and chest pain that had resolved.  Worse at nighttime last night but no shortness of breath today.  Normal ambulatory oxygen saturation.  Initial troponin was negative.  EKG with known bundle branch block.  No signs of volume overload.  Mildly elevated BNP.  Plan for serial troponin and discharge home if second troponin is stable.  Plan to follow-up tomorrow with cardiology. [SM]    Clinical Course User  Index [SM] Corena Herter, MD     FINAL CLINICAL IMPRESSION(S) / ED DIAGNOSES   Final diagnoses:  None     Rx / DC Orders   ED Discharge Orders     None        Note:  This document was prepared using Dragon voice recognition software and may include unintentional dictation errors.   Georga Hacking, MD 01/17/23 [phone]

## 2023-01-17 NOTE — ED Notes (Signed)
Ambulatory O2 saturation 95%. MD aware

## 2023-01-17 NOTE — ED Notes (Signed)
Pt verbalizes understanding of discharge instructions. Opportunity for questioning and answers were provided. Pt discharged from ED to home with daughter.    

## 2023-01-17 NOTE — ED Notes (Signed)
ED Provider at bedside. 

## 2023-01-17 NOTE — Discharge Instructions (Addendum)
Your chest x-ray and cardiac enzymes were reassuring.  It does not look like you have any fluid in your lungs.  Continue to take your Lasix as prescribed.  Your kidney function was bumped today.  You should have this rechecked in about 1 week.  Please see your cardiologist to soon as possible.  If you have worsening shortness of breath or any new or worsening chest pain then please return to the emergency department.

## 2023-01-22 DIAGNOSIS — N182 Chronic kidney disease, stage 2 (mild): Secondary | ICD-10-CM | POA: Diagnosis not present

## 2023-01-22 DIAGNOSIS — N289 Disorder of kidney and ureter, unspecified: Secondary | ICD-10-CM | POA: Diagnosis not present

## 2023-01-22 DIAGNOSIS — I2510 Atherosclerotic heart disease of native coronary artery without angina pectoris: Secondary | ICD-10-CM | POA: Diagnosis not present

## 2023-01-22 DIAGNOSIS — R200 Anesthesia of skin: Secondary | ICD-10-CM | POA: Diagnosis not present

## 2023-01-22 DIAGNOSIS — I10 Essential (primary) hypertension: Secondary | ICD-10-CM | POA: Diagnosis not present

## 2023-01-22 DIAGNOSIS — I739 Peripheral vascular disease, unspecified: Secondary | ICD-10-CM | POA: Diagnosis not present

## 2023-01-22 DIAGNOSIS — I480 Paroxysmal atrial fibrillation: Secondary | ICD-10-CM | POA: Diagnosis not present

## 2023-01-22 DIAGNOSIS — R600 Localized edema: Secondary | ICD-10-CM | POA: Diagnosis not present

## 2023-01-22 DIAGNOSIS — N184 Chronic kidney disease, stage 4 (severe): Secondary | ICD-10-CM | POA: Diagnosis not present

## 2023-01-23 ENCOUNTER — Telehealth: Payer: Self-pay

## 2023-01-23 NOTE — Telephone Encounter (Signed)
PC SW LVM for patient to schedule initial home consult per daughter request.  Awaiting return call.

## 2023-01-23 NOTE — Telephone Encounter (Signed)
PC SW outreached patients daughter, Darel Hong, to discuss new PC referral  for patient and schedule home consult.   Daughter requested visit be scheduled with patient instead.  SW to call patient.

## 2023-01-24 ENCOUNTER — Telehealth: Payer: Self-pay

## 2023-01-24 NOTE — Telephone Encounter (Signed)
Incoming call: patient returned SW call.  Initial PC visit scheduled for Thu 5/16 per patient request due to having other appts next week.

## 2023-01-24 NOTE — Telephone Encounter (Signed)
2ND attempt:  TC to patient to schedule initial home consult. Unable to LVM now due to VM being full.     2nd outreach to daughter, Darel Hong, to make aware of attempts. SW LVM for daughter.

## 2023-01-30 DIAGNOSIS — H353122 Nonexudative age-related macular degeneration, left eye, intermediate dry stage: Secondary | ICD-10-CM | POA: Diagnosis not present

## 2023-01-30 DIAGNOSIS — H43813 Vitreous degeneration, bilateral: Secondary | ICD-10-CM | POA: Diagnosis not present

## 2023-01-30 DIAGNOSIS — Z0100 Encounter for examination of eyes and vision without abnormal findings: Secondary | ICD-10-CM | POA: Diagnosis not present

## 2023-01-30 DIAGNOSIS — H353212 Exudative age-related macular degeneration, right eye, with inactive choroidal neovascularization: Secondary | ICD-10-CM | POA: Diagnosis not present

## 2023-01-31 ENCOUNTER — Other Ambulatory Visit: Payer: Medicare HMO

## 2023-01-31 DIAGNOSIS — Z515 Encounter for palliative care: Secondary | ICD-10-CM

## 2023-01-31 NOTE — Progress Notes (Signed)
COMMUNITY PALLIATIVE CARE SW NOTE  PATIENT NAME: Cheyenne [name] DOB: 1928/06/12 MRN: [id number]  PRIMARY CARE PROVIDER: Luciana Axe, NP  RESPONSIBLE PARTY:  Acct ID - Guarantor Home Phone Work Phone Relationship Acct Type  [id number] - [place]* [phone]  Self P/F     [address], [place], [place] [address]     PLAN OF CARE and INTERVENTIONS:              GOALS OF CARE/ ADVANCE CARE PLANNING: Goals include to maximize quality of life.                                                       Code Status: DNR                               ACD: none                               GOC: to stay out of the hospital and "lose weight".  2.        SOCIAL/EMOTIONAL/SPIRITUAL ASSESSMENT/ INTERVENTIONS:         Palliative care encounter: SW completed initial in home visit with patient and patients niece Jamesetta So. PC criteria reviewed and discussed. Consent reviewed and signed. SW outreached daughter, Darel Hong, after visit to update.   Presenting problem: 89 y.o. year old female with L sciatica, CHF, SOB, debility, chronic kidney disease and low vision. Patient seen today for initial PC consult, new referral sent from PCP to address functional and cognitive decline. Patients confirms that declines noted and states she is open to [facility] consultation. Medications reviewed,  Pain; Has L sciatica pain every now and then. Takes Lyrica for pain symptoms.    CHF: Endorses DOE, poor endurance. On meds and followed by cardiology, continue. Today presents with edema +2 in lower bilateral legs. Patient shared that she saw cardio on 5/7 and was instructed to increase torsemide to 40mg  once/day x5 days then back to 20mg  once a day. See weights below. 5/7: 142lbs 5/16: 146.6lbs Patient encouraged/educated to elevate feet, wear compression stockings and weigh herself daily. TC to cardiology office to make aware of weight gain. Caridac education book provided to patient.    Renal function: referral placed to  nephrology by cardiologist on 5/7, patient awaiting call to schedule initial appt.   Nutrition: states her appetite is "too" good and she would like to lose a little weight. Endorses needing to drink more water.    Mobility: No recent falls. Able to ambulate but states her gait is unsteady and she slower now, uses cane and walker in home. Discussed safety features such as bathroom equipment, grab bars. Patient is able to complete her own ADL's. daughter does meal prep.    Psychosocial assessment: completed.   In home support: Patients daughter Raynelle Fanning now stays with her to ensure she has someone with her night.  Transportation: no needs.  Food: no food insecurities witnessed.   Safety and long term planning: patient feels safe in her home and desires to remain in her home. No life alert system in place   SW discussed goals, reviewed care plan, provided emotional support, used active and reflective listening in the form of  reciprocity emotional response. Questions and concerns were addressed. The patient was encouraged to call with any additional questions and/or concerns. PC Provided general support and encouragement, no other unmet needs identified.    PC will follow up in 2-3 weeks.   3.         PATIENT/CAREGIVER EDUCATION/ COPING:   Appearance: well groomed, appropriate given situation  Mental Status: Alert and oriented. Eye Contact: good  Thought Process: rational  Thought Content: rational  Speech: good/clear  Mood: Normal and calm Affect: Congruent to endorsed mood, full ranging Insight: good Judgement: good Interaction Style: Cooperative   Patient A&O x4 and able to make needs known. Patient able to engage in conversation and answer all questions appropriately. PHQ9: 0 and GAD-7 is 0.     4.         PERSONAL EMERGENCY PLAN:  Patient will call 9-1-1 for emergencies.    5.         COMMUNITY RESOURCES COORDINATION/ HEALTH CARE NAVIGATION: none    6.      FINANCIAL  CONCERNS/NEEDS: None                         Primary Health Insurance: [employer] Secondary Health Insurance: none       SOCIAL HX:  Social History   Tobacco Use   Smoking status: Never   Smokeless tobacco: Never  Substance Use Topics   Alcohol use: Never    CODE STATUS: DNR  ADVANCED DIRECTIVES: N MOST FORM COMPLETE:  Y HOSPICE EDUCATION PROVIDED: N  PPS: 50%  TIME SPENT: 50 MIN    [place], [place]

## 2023-02-13 DIAGNOSIS — M48061 Spinal stenosis, lumbar region without neurogenic claudication: Secondary | ICD-10-CM | POA: Diagnosis not present

## 2023-02-13 DIAGNOSIS — F4321 Adjustment disorder with depressed mood: Secondary | ICD-10-CM | POA: Diagnosis not present

## 2023-02-13 DIAGNOSIS — I2510 Atherosclerotic heart disease of native coronary artery without angina pectoris: Secondary | ICD-10-CM | POA: Diagnosis not present

## 2023-02-13 DIAGNOSIS — I5043 Acute on chronic combined systolic (congestive) and diastolic (congestive) heart failure: Secondary | ICD-10-CM | POA: Diagnosis not present

## 2023-02-13 DIAGNOSIS — N184 Chronic kidney disease, stage 4 (severe): Secondary | ICD-10-CM | POA: Diagnosis not present

## 2023-02-13 DIAGNOSIS — I480 Paroxysmal atrial fibrillation: Secondary | ICD-10-CM | POA: Diagnosis not present

## 2023-02-14 ENCOUNTER — Other Ambulatory Visit: Payer: Medicare HMO

## 2023-02-14 VITALS — BP 130/64 | HR 58 | Temp 97.6°F | Wt 141.4 lb

## 2023-02-14 DIAGNOSIS — Z515 Encounter for palliative care: Secondary | ICD-10-CM

## 2023-02-14 NOTE — Progress Notes (Signed)
COMMUNITY PALLIATIVE CARE SW NOTE  PATIENT NAME: Cheyenne [name] DOB: [birth year] MRN: [id number]  PRIMARY CARE PROVIDER: Luciana Axe, NP  RESPONSIBLE PARTY:  Acct ID - Guarantor Home Phone Work Phone Relationship Acct Type  [id number] - [place]* [phone]  Self P/F     [address], [place], [place] [address]     PLAN OF CARE and INTERVENTIONS:              Palliative care encounter: In home follow up appointment completed with patient and PC RN Raynelle Fanning, B.  CHF: patients endoreses SOB with activities. wearing compression stockings today. weight today is 141.4.lbs.   renal failure: patient now has appt with nehprology on 7/10.   Nutrtion: no changes in appetite. remains good.  Mobility: patient is ambulatory with SPC. Patient states that she is starting to try and walk around outside more and do more activities in the yard. No falls reported.   Cognition: A&O x4, clear and stable patient able to make needs known.   Pain: no reported today.   Plan: PC to follow up in 3-4 weeks.     SOCIAL HX:  Social History   Tobacco Use   Smoking status: Never   Smokeless tobacco: Never  Substance Use Topics   Alcohol use: Never    CODE STATUS: DNR  MOST FORM COMPLETE: Y HOSPICE EDUCATION PROVIDED: N       [place], LCSW

## 2023-02-14 NOTE — Progress Notes (Signed)
PATIENT NAME: Cheyenne [name] DOB: [birth year] MRN: [id number]  PRIMARY CARE PROVIDER: Luciana Axe, NP  RESPONSIBLE PARTY:  Acct ID - Guarantor Home Phone Work Phone Relationship Acct Type  [id number] [facility]* [phone]  Self P/F     [address], [place], [place] [address]   Home visit completed with patient and [place], SW.  Appetite:  Doing well with intake patient states "too good".  Concerned about her weight gain.  1 year ago she was at 126 lbs.    Cardiac:  Hx of combined heart failure.  Weighing herself routinely.  Noted weight decrease since last PC visit 2 weeks ago.  Was previously at 146 lbs and today is at 141 lbs. Encouraged to document weights and notify cardiology for 3 lb weight gain in 24 hours or 5 lb weight gain in 1 week.  Patient verbalized understanding and advised she has been told this previously. Patient endorses shortness of breath with exertion.  Denies issues with headaches, dizziness, palpations or chest pain.   Wearing TED hose due to edema in bilateral lower extremities.    Functional Status:  Patient lives at home with family member.  She is independent with dressing, bathing and grooming. Using a cane for ambulation.  No recent falls.  Does some cooking but mostly daughter is managing meals.  Patient enjoys walking in her neighborhood and working in her flower garden.   Pain:  Tenderness to legs.  Reports left sciatica pain that comes and goes.  Is much better since surgical procedure last year.  Currently on Lyrica.   Follow up visit:  Phone visit for 03/12/23 @ 3 pm and In-person on 04/08/23 @ 1030 am.   CODE STATUS: DNR ADVANCED DIRECTIVES: Not on file MOST FORM: Yes PPS: 50%   PHYSICAL EXAM:   VITALS: Today's Vitals   02/14/23 1006  BP: 130/64  Pulse: (!) 58  Temp: 97.6 F (36.4 C)  SpO2: 95%  Weight: 141 lb 6.4 oz (64.1 kg)    LUNGS: clear to auscultation  CARDIAC: Cor RRR}  EXTREMITIES: 1+ bilateral ankle edema present.   SKIN: Skin color, texture, turgor normal. No rashes or lesions or mobility and turgor normal  NEURO: positive for gait problems       Truitt Merle, RN

## 2023-03-12 ENCOUNTER — Other Ambulatory Visit: Payer: Medicare HMO

## 2023-03-27 DIAGNOSIS — N184 Chronic kidney disease, stage 4 (severe): Secondary | ICD-10-CM | POA: Diagnosis not present

## 2023-03-27 DIAGNOSIS — R8290 Unspecified abnormal findings in urine: Secondary | ICD-10-CM | POA: Diagnosis not present

## 2023-03-27 DIAGNOSIS — I129 Hypertensive chronic kidney disease with stage 1 through stage 4 chronic kidney disease, or unspecified chronic kidney disease: Secondary | ICD-10-CM | POA: Diagnosis not present

## 2023-04-08 ENCOUNTER — Other Ambulatory Visit: Payer: Medicare HMO

## 2023-04-08 DIAGNOSIS — N184 Chronic kidney disease, stage 4 (severe): Secondary | ICD-10-CM | POA: Diagnosis not present

## 2023-04-08 DIAGNOSIS — I129 Hypertensive chronic kidney disease with stage 1 through stage 4 chronic kidney disease, or unspecified chronic kidney disease: Secondary | ICD-10-CM | POA: Diagnosis not present

## 2023-04-08 DIAGNOSIS — R8290 Unspecified abnormal findings in urine: Secondary | ICD-10-CM | POA: Diagnosis not present

## 2023-04-10 DIAGNOSIS — N2581 Secondary hyperparathyroidism of renal origin: Secondary | ICD-10-CM | POA: Diagnosis not present

## 2023-04-10 DIAGNOSIS — N184 Chronic kidney disease, stage 4 (severe): Secondary | ICD-10-CM | POA: Diagnosis not present

## 2023-04-10 DIAGNOSIS — E790 Hyperuricemia without signs of inflammatory arthritis and tophaceous disease: Secondary | ICD-10-CM | POA: Diagnosis not present

## 2023-04-10 DIAGNOSIS — C9000 Multiple myeloma not having achieved remission: Secondary | ICD-10-CM | POA: Diagnosis not present

## 2023-04-10 DIAGNOSIS — E875 Hyperkalemia: Secondary | ICD-10-CM | POA: Diagnosis not present

## 2023-04-10 DIAGNOSIS — I129 Hypertensive chronic kidney disease with stage 1 through stage 4 chronic kidney disease, or unspecified chronic kidney disease: Secondary | ICD-10-CM | POA: Diagnosis not present

## 2023-04-17 DIAGNOSIS — E875 Hyperkalemia: Secondary | ICD-10-CM | POA: Diagnosis not present

## 2023-04-17 DIAGNOSIS — I129 Hypertensive chronic kidney disease with stage 1 through stage 4 chronic kidney disease, or unspecified chronic kidney disease: Secondary | ICD-10-CM | POA: Diagnosis not present

## 2023-04-17 DIAGNOSIS — N184 Chronic kidney disease, stage 4 (severe): Secondary | ICD-10-CM | POA: Diagnosis not present

## 2023-05-02 ENCOUNTER — Ambulatory Visit
Admission: RE | Admit: 2023-05-02 | Discharge: 2023-05-02 | Disposition: A | Discharge status: Home / Self Care | Payer: Medicare HMO | Source: Ambulatory Visit | Attending: Internal Medicine | Admitting: Internal Medicine

## 2023-05-02 ENCOUNTER — Encounter: Payer: Self-pay | Admitting: Internal Medicine

## 2023-05-02 ENCOUNTER — Ambulatory Visit
Admission: RE | Admit: 2023-05-02 | Discharge: 2023-05-02 | Disposition: A | Discharge status: Home / Self Care | Payer: Medicare HMO | Attending: Internal Medicine | Admitting: Internal Medicine

## 2023-05-02 ENCOUNTER — Inpatient Hospital Stay: Payer: Medicare HMO | Attending: Internal Medicine | Admitting: Internal Medicine

## 2023-05-02 ENCOUNTER — Inpatient Hospital Stay: Payer: Medicare HMO

## 2023-05-02 VITALS — BP 148/51 | HR 58 | Temp 97.6°F | Ht 61.0 in | Wt 141.6 lb

## 2023-05-02 DIAGNOSIS — Z807 Family history of other malignant neoplasms of lymphoid, hematopoietic and related tissues: Secondary | ICD-10-CM | POA: Diagnosis not present

## 2023-05-02 DIAGNOSIS — Z853 Personal history of malignant neoplasm of breast: Secondary | ICD-10-CM | POA: Insufficient documentation

## 2023-05-02 DIAGNOSIS — D472 Monoclonal gammopathy: Secondary | ICD-10-CM

## 2023-05-02 DIAGNOSIS — M1610 Unilateral primary osteoarthritis, unspecified hip: Secondary | ICD-10-CM | POA: Diagnosis not present

## 2023-05-02 DIAGNOSIS — I4891 Unspecified atrial fibrillation: Secondary | ICD-10-CM | POA: Insufficient documentation

## 2023-05-02 DIAGNOSIS — Z7901 Long term (current) use of anticoagulants: Secondary | ICD-10-CM | POA: Insufficient documentation

## 2023-05-02 DIAGNOSIS — I509 Heart failure, unspecified: Secondary | ICD-10-CM | POA: Diagnosis not present

## 2023-05-02 DIAGNOSIS — Z9011 Acquired absence of right breast and nipple: Secondary | ICD-10-CM | POA: Insufficient documentation

## 2023-05-02 DIAGNOSIS — N184 Chronic kidney disease, stage 4 (severe): Secondary | ICD-10-CM | POA: Insufficient documentation

## 2023-05-02 DIAGNOSIS — Z79899 Other long term (current) drug therapy: Secondary | ICD-10-CM | POA: Diagnosis not present

## 2023-05-02 DIAGNOSIS — M47812 Spondylosis without myelopathy or radiculopathy, cervical region: Secondary | ICD-10-CM | POA: Diagnosis not present

## 2023-05-02 LAB — COMPREHENSIVE METABOLIC PANEL
ALT: 12 U/L (ref 0–44)
AST: 20 U/L (ref 15–41)
Albumin: 4.1 g/dL (ref 3.5–5.0)
Alkaline Phosphatase: 66 U/L (ref 38–126)
Anion gap: 7 (ref 5–15)
BUN: 35 mg/dL — ABNORMAL HIGH (ref 8–23)
CO2: 22 mmol/L (ref 22–32)
Calcium: 8.8 mg/dL — ABNORMAL LOW (ref 8.9–10.3)
Chloride: 108 mmol/L (ref 98–111)
Creatinine, Ser: 1.88 mg/dL — ABNORMAL HIGH (ref 0.44–1.00)
GFR, Estimated: 25 mL/min — ABNORMAL LOW (ref >60)
Glucose, Bld: 89 mg/dL (ref 70–99)
Potassium: 5.1 mmol/L (ref 3.5–5.1)
Sodium: 137 mmol/L (ref 135–145)
Total Bilirubin: 0.6 mg/dL (ref 0.3–1.2)
Total Protein: 7.5 g/dL (ref 6.5–8.1)

## 2023-05-02 LAB — CBC WITH DIFFERENTIAL/PLATELET
Abs Immature Granulocytes: 0.02 10*3/uL (ref 0.00–0.07)
Basophils Absolute: 0.1 10*3/uL (ref 0.0–0.1)
Basophils Relative: 1 %
Eosinophils Absolute: 0.7 10*3/uL — ABNORMAL HIGH (ref 0.0–0.5)
Eosinophils Relative: 8 %
HCT: 31.2 % — ABNORMAL LOW (ref 36.0–46.0)
Hemoglobin: 10.2 g/dL — ABNORMAL LOW (ref 12.0–15.0)
Immature Granulocytes: 0 %
Lymphocytes Relative: 34 %
Lymphs Abs: 2.6 10*3/uL (ref 0.7–4.0)
MCH: 31.9 pg (ref 26.0–34.0)
MCHC: 32.7 g/dL (ref 30.0–36.0)
MCV: 97.5 fL (ref 80.0–100.0)
Monocytes Absolute: 0.8 10*3/uL (ref 0.1–1.0)
Monocytes Relative: 10 %
Neutro Abs: 3.6 10*3/uL (ref 1.7–7.7)
Neutrophils Relative %: 47 %
Platelets: 142 10*3/uL — ABNORMAL LOW (ref 150–400)
RBC: 3.2 MIL/uL — ABNORMAL LOW (ref 3.87–5.11)
RDW: 14.2 % (ref 11.5–15.5)
WBC: 7.8 10*3/uL (ref 4.0–10.5)
nRBC: 0 % (ref 0.0–0.2)

## 2023-05-02 LAB — BRAIN NATRIURETIC PEPTIDE: B Natriuretic Peptide: 98.3 pg/mL (ref 0.0–100.0)

## 2023-05-02 NOTE — Assessment & Plan Note (Addendum)
#   JULY 2024- K/L= 42/247; Anemia [hb 10.3]; platelets- 127. No Hypercalcemia. GFR 30s.   # I do long discussion with patient and daughter regarding significant concerns of multiple myeloma.  Given anemia/renal dysfunction-concern for active myeloma.   # Given her age-discussed with patient daughter regarding their preference of further workup.  Patient and daughter seem to be interested in further workup/and diagnosing the problem-and then consider treatment options.  # Discussed with the patient the bone marrow biopsy and aspiration indication and procedure at length.  Given significant discomfort involved-I would recommend under anesthesia/with radiology in the hospital. I discussed the potential complications include-bleeding/trauma and risk of infection; which are fortunately very rare.  Patient is in agreement. Patient will sign the consent prior to the procedure. Bone marrow biopsy/aspiration is ordered.   # CKD- [2021-2022]- III-IV [Dr.Singh]-question related to multiple myeloma versus others.  Discussed with Dr. Thedore Mins.  # History of CHF- clinically stable.  Thank you Dr.Singh for allowing me to participate in the care of your pleasant patient. Please do not hesitate to contact me with questions or concerns in the interim.   # DISPOSITION: # labs today-check CBC CMP beta-2 microglobulin Kappa lambda light chain ratio multiple myeloma panel LDH. # Bone survey # bone marrow Bx-in 1 w # follow up in 2-3 weeks or so-MD; no labs- Dr.B

## 2023-05-02 NOTE — Progress Notes (Signed)
[facility] CONSULT NOTE  Patient Care Team: Luciana Axe, NP as PCP - General (Family Medicine) Eliezer Lofts, NP (Inactive) as Nurse Practitioner (Hospice and Palliative Medicine) Alwyn Pea, MD as Consulting Physician (Cardiology) Luciana Axe, NP as Nurse Practitioner (Family Medicine) Earna Coder, MD as Consulting Physician (Oncology)  CHIEF COMPLAINTS/PURPOSE OF CONSULTATION: anemia/multiple myeloma.   Oncology History   No history exists.   Ref Range & Units 1 mo ago Comments  Free Kappa Lt Chains, S 3.3 - 19.4 mg/L 42.7 High    Free Lambda Lt Chains, S 5.7 - 26.3 mg/L 374.7 High    Free Kappa/Lambda Ratio 0.26 - 1.65 0.11 Low     HISTORY OF PRESENTING ILLNESS: Patient ambulating-cane/with assistance.  Accompanied by daughter.   Cheyenne [name] 89 y.o.  female patient with remote history of breast cancer [1995] with multiple medical problems including A.fib; CKD; CHF is currently referred for abnormal MM panel.   C/o chest congestion, cough x2 days. No fever.  Nonproductive.  Patient otherwise denies any new onset of tingling or numbness joint pains or back pain preoperative state.  No weight loss.   Review of Systems  Constitutional:  Positive for malaise/fatigue. Negative for chills, diaphoresis, fever and weight loss.  HENT:  Negative for nosebleeds and sore throat.   Eyes:  Negative for double vision.  Respiratory:  Positive for cough. Negative for hemoptysis, sputum production, shortness of breath and wheezing.   Cardiovascular:  Negative for chest pain, palpitations, orthopnea and leg swelling.  Gastrointestinal:  Negative for abdominal pain, blood in stool, constipation, diarrhea, heartburn, melena, nausea and vomiting.  Genitourinary:  Negative for dysuria, frequency and urgency.  Musculoskeletal:  Positive for back pain and joint pain.  Skin: Negative.  Negative for itching and rash.  Neurological:   Negative for dizziness, tingling, focal weakness, weakness and headaches.  Endo/Heme/Allergies:  Does not bruise/bleed easily.  Psychiatric/Behavioral:  Negative for depression. The patient is not nervous/anxious and does not have insomnia.     MEDICAL HISTORY:  Past Medical History:  Diagnosis Date   A-fib [facility])    a.) CHA2DS2VASc = 6 (age x 2, sex, HFrEF, HTN, NSTEMI);  b.) rate/rhythm maintained on oral metoprolol succinate; chronically anticoagulated with apixaban + clopidogrel   Allergic rhinitis    Anemia    Anxiety    Arthritis    Asthma    Breast cancer, right (HCC) 1995   a.) s/p surgical resection (mastectomy) + chemotherapy + 5 years endocrine therapy (tamoxifen)   CAD (coronary artery disease)    a.) LHC 09/15/2021: 90% mLAD, 70% D1, 99% pLCx, 95% mRCA.   Carpal tunnel syndrome    Cervical radiculopathy due to degenerative joint disease of spine    Chronic kidney disease (CKD), stage IV (severe) (HCC)    Chronic use of opiate for therapeutic purpose    a.) has naloxone Rx in place   Depression    Diverticulitis    Diverticulosis    DOE (dyspnea on exertion)    Followed by palliative care service    GERD (gastroesophageal reflux disease)    H. pylori infection 06/2005   H/O bilateral cataract extraction 12/2005   HFrEF (heart failure with reduced ejection fraction) (HCC) 09/14/2021   a.) newly Dx'd. EF 35%, glob HK, sep/inf/post AK, mild LAE, triv AR/PR, mild MR/TR, GLS -9.1%, G2DD; b.) TTE 02/14/2022: EF 60-65%, mild-mod MR, G1DD   History of spinal fracture 1990   a.) T7 and  T12 fractures - traumatic sequela of a mechanical fall   HLD (hyperlipidemia)    HTN (hypertension)    Impaired glucose tolerance    LBBB (left bundle branch block)    Long term current use of anticoagulant    a.) dose reduced apixaban   Long term current use of antithrombotics/antiplatelets    a.) clopidogrel   Lumbago    Lumbar radiculopathy    Macular degeneration    NSTEMI (non-ST  elevated myocardial infarction) (HCC) 09/13/2021   a.) LHC/PCI 09/15/2021: 90% mLAD (plans for staged PCI), 70% D1, 99% pLCx (2.75 x 30 mm Resolute Onyx Frontier DES), 95% mRCA (3.5 x 15 mm Resolute Onyx Frontier DES); b.) staged PCI 09/19/2021 - PTCA/DES x1 (unknown type) to mLAD   Osteoporosis    Ovarian cyst    Paresthesia of both hands    Peripheral edema    PVD (peripheral vascular disease) (HCC)    Vitamin D deficiency     SURGICAL HISTORY: Past Surgical History:  Procedure Laterality Date   ABDOMINAL HYSTERECTOMY  1972   CARPAL TUNNEL RELEASE Right 08/10/2019   CATARACT EXTRACTION Bilateral 2007   COLONOSCOPY     CORONARY ANGIOPLASTY WITH STENT PLACEMENT Left 09/15/2021   Procedure: CORONARY ANGIOPLASTY WITH STENT PLACEMENT; Location: [facility]; Surgeon: Zenovia Jarred, MD   CORONARY ANGIOPLASTY WITH STENT PLACEMENT Left 09/19/2021   Procedure: CORONARY ANGIOPLASTY WITH STENT PLACEMENT; Location: [facility]; Surgeon: Rubie Maid, MD   LUMBAR LAMINECTOMY/DECOMPRESSION MICRODISCECTOMY Left 07/25/2022   Procedure: LEFT L4-5 LAMINOFORAMINOTOMY;  Surgeon: Venetia Night, MD;  Location: [facility];  Service: Neurosurgery;  Laterality: Left;   MASTECTOMY Right 1999   SIGMOIDOSCOPY  2004   UPPER GASTROINTESTINAL ENDOSCOPY  2006    SOCIAL HISTORY: Social History   Socioeconomic History   Marital status: Widowed    Spouse name: Not on file   Number of children: Not on file   Years of education: Not on file   Highest education level: Not on file  Occupational History   Not on file  Tobacco Use   Smoking status: Never   Smokeless tobacco: Never  Vaping Use   Vaping status: Never Used  Substance and Sexual Activity   Alcohol use: Never   Drug use: Never   Sexual activity: Not on file  Other Topics Concern   Not on file  Social History Narrative   Lives alone   Social Determinants of Health   Financial Resource Strain: Low Risk  (10/29/2017)   Received from [facility], [facility]   Overall Financial Resource Strain (CARDIA)    Difficulty of Paying Living Expenses: Not very hard  Food Insecurity: No Food Insecurity (05/02/2023)   Hunger Vital Sign    Worried About Running Out of Food in the Last Year: Never true    Ran Out of Food in the Last Year: Never true  Transportation Needs: No Transportation Needs (05/02/2023)   PRAPARE - Administrator, Civil Service (Medical): No    Lack of Transportation (Non-Medical): No  Physical Activity: Not on file  Stress: Stress Concern Present (10/29/2017)   Received from [facility], [facility]   [employer] of Occupational Health - Occupational Stress Questionnaire    Feeling of Stress : To some extent  Social Connections: Moderately Integrated (10/29/2017)   Received from [facility], [facility]   Social Connection and Isolation Panel [NHANES]    Frequency of Communication with  Friends and Family: More than three times a week    Frequency of Social Gatherings with Friends and Family: More than three times a week    Attends Religious Services: More than 4 times per year    Active Member of [employer] or Organizations: Yes    Attends Banker Meetings: More than 4 times per year    Marital Status: Widowed  Intimate Partner Violence: Not At Risk (05/02/2023)   Humiliation, Afraid, Rape, and Kick questionnaire    Fear of Current or Ex-Partner: No    Emotionally Abused: No    Physically Abused: No    Sexually Abused: No    FAMILY HISTORY: Family History  Problem Relation Age of Onset   Multiple myeloma Daughter     ALLERGIES:  is allergic to alendronate, levofloxacin, codeine, and propoxyphene.  MEDICATIONS:  Current Outpatient Medications  Medication Sig Dispense Refill   acetaminophen (TYLENOL) 500 MG tablet Take 1,000 mg by mouth every 6 (six) hours as needed.      Cholecalciferol (VITAMIN D3 PO) Take by mouth daily.     clopidogrel (PLAVIX) 75 MG tablet Take 75 mg by mouth daily.     cyanocobalamin 1000 MCG tablet TAKE 1 TABLET EVERY DAY FOR VITAMIN B12 DEFICIENCY     lisinopril (ZESTRIL) 2.5 MG tablet Take 2.5 mg by mouth daily.     metoprolol succinate (TOPROL-XL) 25 MG 24 hr tablet Take 25 mg by mouth daily.     nitroGLYCERIN (NITROSTAT) 0.4 MG SL tablet      pregabalin (LYRICA) 150 MG capsule Take 150 mg by mouth 2 (two) times daily.     torsemide (DEMADEX) 20 MG tablet Take 20 mg by mouth daily.     cetirizine (ZYRTEC) 10 MG tablet Take by mouth as needed.     ELIQUIS 2.5 MG TABS tablet Take 2.5 mg by mouth 2 (two) times daily. (Patient not taking: Reported on 05/02/2023)     methocarbamol (ROBAXIN) 500 MG tablet Take 1 tablet (500 mg total) by mouth every 6 (six) hours as needed for muscle spasms. (Patient not taking: Reported on 05/02/2023) 60 tablet 0   naloxone (NARCAN) nasal spray 4 mg/0.1 mL SMARTSIG:Both Nares (Patient not taking: Reported on 05/02/2023)     pantoprazole (PROTONIX) 40 MG tablet Take 1 tablet (40 mg total) by mouth daily. 30 tablet 0   senna (SENOKOT) 8.6 MG TABS tablet Take 1 tablet (8.6 mg total) by mouth 2 (two) times daily as needed for mild constipation. (Patient not taking: Reported on 05/02/2023) 120 tablet 0   No current facility-administered medications for this visit.    PHYSICAL EXAMINATION:  Vitals:   05/02/23 1044  BP: (!) 148/51  Pulse: (!) 58  Temp: 97.6 F (36.4 C)  SpO2: 98%   Filed Weights   05/02/23 1044  Weight: 141 lb 9.6 oz (64.2 kg)    Physical Exam Vitals and nursing note reviewed.  HENT:     Head: Normocephalic and atraumatic.     Mouth/Throat:     Pharynx: Oropharynx is clear.  Eyes:     Extraocular Movements: Extraocular movements intact.     Pupils: Pupils are equal, round, and reactive to light.  Cardiovascular:     Rate and Rhythm: Normal rate and regular rhythm.  Pulmonary:      Comments: Decreased breath sounds bilaterally.  Abdominal:     Palpations: Abdomen is soft.  Musculoskeletal:        General: Normal range of motion.  Cervical back: Normal range of motion.  Skin:    General: Skin is warm.  Neurological:     General: No focal deficit present.     Mental Status: She is alert and oriented to person, place, and time.  Psychiatric:        Behavior: Behavior normal.        Judgment: Judgment normal.     LABORATORY DATA:  I have reviewed the data as listed Lab Results  Component Value Date   WBC 7.8 05/02/2023   HGB 10.2 (L) 05/02/2023   HCT 31.2 (L) 05/02/2023   MCV 97.5 05/02/2023   PLT 142 (L) 05/02/2023   Recent Labs    01/17/23 1306 05/02/23 1136  NA 139 137  K 4.8 5.1  CL 106 108  CO2 23 22  GLUCOSE 88 89  BUN 41* 35*  CREATININE 1.91* 1.88*  CALCIUM 9.2 8.8*  GFRNONAA 24* 25*  PROT  --  7.5  ALBUMIN  --  4.1  AST  --  20  ALT  --  12  ALKPHOS  --  66  BILITOT  --  0.6    RADIOGRAPHIC STUDIES: I have personally reviewed the radiological images as listed and agreed with the findings in the report. No results found.   Monoclonal gammopathies # JULY 2024- K/L= 42/247; Anemia [hb 10.3]; platelets- 127. No Hypercalcemia. GFR 30s.   # I do long discussion with patient and daughter regarding significant concerns of multiple myeloma.  Given anemia/renal dysfunction-concern for active myeloma.   # Given her age-discussed with patient daughter regarding their preference of further workup.  Patient and daughter seem to be interested in further workup/and diagnosing the problem-and then consider treatment options.  # Discussed with the patient the bone marrow biopsy and aspiration indication and procedure at length.  Given significant discomfort involved-I would recommend under anesthesia/with radiology in the hospital. I discussed the potential complications include-bleeding/trauma and risk of infection; which are fortunately very  rare.  Patient is in agreement. Patient will sign the consent prior to the procedure. Bone marrow biopsy/aspiration is ordered.   # CKD- [2021-2022]- III-IV [Dr.Singh]-question related to multiple myeloma versus others.  Discussed with Dr. Thedore Mins.  # History of CHF- clinically stable.  Thank you Dr.Singh for allowing me to participate in the care of your pleasant patient. Please do not hesitate to contact me with questions or concerns in the interim.   # DISPOSITION: # labs today-check CBC CMP beta-2 microglobulin Kappa lambda light chain ratio multiple myeloma panel LDH. # Bone survey # bone marrow Bx-in 1 w # follow up in 2-3 weeks or so-MD; no labs- Dr.B  Above plan of care was discussed with patient/family in detail.  My contact information was given to the patient/family.    Earna Coder, MD 05/03/2023 2:20 PM

## 2023-05-02 NOTE — Progress Notes (Signed)
C/o chest congestion, cough x2 days. No fever.

## 2023-05-03 LAB — KAPPA/LAMBDA LIGHT CHAINS
Kappa free light chain: 47.9 mg/L — ABNORMAL HIGH (ref 3.3–19.4)
Kappa, lambda light chain ratio: 0.1 — ABNORMAL LOW (ref 0.26–1.65)
Lambda free light chains: 462.5 mg/L — ABNORMAL HIGH (ref 5.7–26.3)

## 2023-05-03 LAB — BETA 2 MICROGLOBULIN, SERUM: Beta-2 Microglobulin: 6.8 mg/L — ABNORMAL HIGH (ref 0.6–2.4)

## 2023-05-07 LAB — MULTIPLE MYELOMA PANEL, SERUM
Albumin SerPl Elph-Mcnc: 4 g/dL (ref 2.9–4.4)
Albumin/Glob SerPl: 1.3 (ref 0.7–1.7)
Alpha 1: 0.2 g/dL (ref 0.0–0.4)
Alpha2 Glob SerPl Elph-Mcnc: 0.6 g/dL (ref 0.4–1.0)
B-Globulin SerPl Elph-Mcnc: 0.8 g/dL (ref 0.7–1.3)
Gamma Glob SerPl Elph-Mcnc: 1.5 g/dL (ref 0.4–1.8)
Globulin, Total: 3.1 g/dL (ref 2.2–3.9)
IgA: 102 mg/dL (ref 64–422)
IgG (Immunoglobin G), Serum: 1655 mg/dL — ABNORMAL HIGH (ref 586–1602)
IgM (Immunoglobulin M), Srm: 42 mg/dL (ref 26–217)
M Protein SerPl Elph-Mcnc: 1 g/dL — ABNORMAL HIGH
Total Protein ELP: 7.1 g/dL (ref 6.0–8.5)

## 2023-05-08 ENCOUNTER — Other Ambulatory Visit (HOSPITAL_COMMUNITY): Payer: Self-pay | Admitting: Student

## 2023-05-08 DIAGNOSIS — D472 Monoclonal gammopathy: Secondary | ICD-10-CM

## 2023-05-10 ENCOUNTER — Other Ambulatory Visit: Payer: Self-pay | Admitting: Student

## 2023-05-10 NOTE — Progress Notes (Signed)
Patient for IR Bone Marrow Biopsy on Monday 05/13/23, I called and spoke with the patient's daughter, Darel Hong on the phone and gave pre-procedure instructions. Darel Hong was made aware to have the patient here at 8:30a, NPO after MN prior to procedure as well as driver post procedure/recovery/discharge. Darel Hong stated understanding.  Called 05/10/2023

## 2023-05-13 ENCOUNTER — Ambulatory Visit
Admission: RE | Admit: 2023-05-13 | Discharge: 2023-05-13 | Disposition: A | Discharge status: Home / Self Care | Payer: Medicare HMO | Source: Ambulatory Visit | Attending: Internal Medicine | Admitting: Internal Medicine

## 2023-05-13 DIAGNOSIS — Z1379 Encounter for other screening for genetic and chromosomal anomalies: Secondary | ICD-10-CM | POA: Insufficient documentation

## 2023-05-13 DIAGNOSIS — K219 Gastro-esophageal reflux disease without esophagitis: Secondary | ICD-10-CM | POA: Diagnosis not present

## 2023-05-13 DIAGNOSIS — D472 Monoclonal gammopathy: Secondary | ICD-10-CM | POA: Insufficient documentation

## 2023-05-13 DIAGNOSIS — Z853 Personal history of malignant neoplasm of breast: Secondary | ICD-10-CM | POA: Diagnosis not present

## 2023-05-13 DIAGNOSIS — D649 Anemia, unspecified: Secondary | ICD-10-CM | POA: Diagnosis not present

## 2023-05-13 DIAGNOSIS — I447 Left bundle-branch block, unspecified: Secondary | ICD-10-CM | POA: Diagnosis not present

## 2023-05-13 DIAGNOSIS — I5022 Chronic systolic (congestive) heart failure: Secondary | ICD-10-CM | POA: Diagnosis not present

## 2023-05-13 DIAGNOSIS — F419 Anxiety disorder, unspecified: Secondary | ICD-10-CM | POA: Insufficient documentation

## 2023-05-13 DIAGNOSIS — I4891 Unspecified atrial fibrillation: Secondary | ICD-10-CM | POA: Insufficient documentation

## 2023-05-13 DIAGNOSIS — I2510 Atherosclerotic heart disease of native coronary artery without angina pectoris: Secondary | ICD-10-CM | POA: Diagnosis not present

## 2023-05-13 DIAGNOSIS — E8581 Light chain (AL) amyloidosis: Secondary | ICD-10-CM | POA: Diagnosis not present

## 2023-05-13 DIAGNOSIS — I130 Hypertensive heart and chronic kidney disease with heart failure and stage 1 through stage 4 chronic kidney disease, or unspecified chronic kidney disease: Secondary | ICD-10-CM | POA: Insufficient documentation

## 2023-05-13 DIAGNOSIS — E785 Hyperlipidemia, unspecified: Secondary | ICD-10-CM | POA: Diagnosis not present

## 2023-05-13 DIAGNOSIS — I739 Peripheral vascular disease, unspecified: Secondary | ICD-10-CM | POA: Insufficient documentation

## 2023-05-13 DIAGNOSIS — N184 Chronic kidney disease, stage 4 (severe): Secondary | ICD-10-CM | POA: Insufficient documentation

## 2023-05-13 DIAGNOSIS — I252 Old myocardial infarction: Secondary | ICD-10-CM | POA: Diagnosis not present

## 2023-05-13 HISTORY — PX: IR BONE MARROW BIOPSY & ASPIRATION: IMG5727

## 2023-05-13 LAB — CBC WITH DIFFERENTIAL/PLATELET
Abs Immature Granulocytes: 0.03 10*3/uL (ref 0.00–0.07)
Basophils Absolute: 0.1 10*3/uL (ref 0.0–0.1)
Basophils Relative: 1 %
Eosinophils Absolute: 0.4 10*3/uL (ref 0.0–0.5)
Eosinophils Relative: 5 %
HCT: 34.6 % — ABNORMAL LOW (ref 36.0–46.0)
Hemoglobin: 11.4 g/dL — ABNORMAL LOW (ref 12.0–15.0)
Immature Granulocytes: 0 %
Lymphocytes Relative: 40 %
Lymphs Abs: 2.8 10*3/uL (ref 0.7–4.0)
MCH: 31.8 pg (ref 26.0–34.0)
MCHC: 32.9 g/dL (ref 30.0–36.0)
MCV: 96.4 fL (ref 80.0–100.0)
Monocytes Absolute: 0.7 10*3/uL (ref 0.1–1.0)
Monocytes Relative: 10 %
Neutro Abs: 3 10*3/uL (ref 1.7–7.7)
Neutrophils Relative %: 44 %
Platelets: 171 10*3/uL (ref 150–400)
RBC: 3.59 MIL/uL — ABNORMAL LOW (ref 3.87–5.11)
RDW: 14.1 % (ref 11.5–15.5)
WBC: 6.9 10*3/uL (ref 4.0–10.5)
nRBC: 0 % (ref 0.0–0.2)

## 2023-05-13 MED ORDER — FENTANYL CITRATE (PF) 100 MCG/2ML IJ SOLN
INTRAMUSCULAR | Status: AC | PRN
  Administered 2023-05-13: 25 ug via INTRAVENOUS

## 2023-05-13 MED ORDER — MIDAZOLAM HCL 2 MG/2ML IJ SOLN
INTRAMUSCULAR | Status: AC | PRN
  Administered 2023-05-13: .5 mg via INTRAVENOUS

## 2023-05-13 MED ORDER — LIDOCAINE 1 % OPTIME INJ - NO CHARGE
8.0000 mL | Freq: Once | INTRAMUSCULAR | Status: AC
  Administered 2023-05-13: 8 mL via SUBCUTANEOUS
  Filled 2023-05-13: qty 8

## 2023-05-13 MED ORDER — MIDAZOLAM HCL 2 MG/2ML IJ SOLN
INTRAMUSCULAR | Status: AC
  Filled 2023-05-13: qty 2

## 2023-05-13 MED ORDER — SODIUM CHLORIDE 0.9 % IV SOLN: INTRAVENOUS | Status: DC

## 2023-05-13 MED ORDER — HEPARIN SOD (PORK) LOCK FLUSH 100 UNIT/ML IV SOLN
INTRAVENOUS | Status: AC
  Filled 2023-05-13: qty 5

## 2023-05-13 MED ORDER — FENTANYL CITRATE (PF) 100 MCG/2ML IJ SOLN
INTRAMUSCULAR | Status: AC
  Filled 2023-05-13: qty 2

## 2023-05-13 NOTE — H&P (Signed)
Chief Complaint: Patient was seen in consultation today for monoclonal gammopathy  Referring Physician(s): Earna Coder  Supervising Physician: Pernell Dupre  Patient Status: [facility] - Out-pt  History of Present Illness: Cheyenne [name] is a 89 y.o. female with PMH significant for atrial fibrillation, anemia, anxiety, right breast cancer, CAD w/ hx of NSTEMI in 2022, CKD stage IV, GERD, HFrEF, hyperlipidemia, hypertension, LBBB, and peripheral vascular disease being seen today in relation to monoclonal gammopathy. Patient is under the care of Dr Donneta Romberg from Oncology service. Patient has been found to have monoclonal gammopathy on lab exams, and there is concern for multiple myeloma. Patient has been referred to IR for image-guided bone marrow biopsy.  Past Medical History:  Diagnosis Date   [facility])    a.) CHA2DS2VASc = 6 (age x 2, sex, HFrEF, HTN, NSTEMI);  b.) rate/rhythm maintained on oral metoprolol succinate; chronically anticoagulated with apixaban + clopidogrel   Allergic rhinitis    Anemia    Anxiety    Arthritis    Asthma    Breast cancer, right (HCC) 1995   a.) s/p surgical resection (mastectomy) + chemotherapy + 5 years endocrine therapy (tamoxifen)   CAD (coronary artery disease)    a.) LHC 09/15/2021: 90% mLAD, 70% D1, 99% pLCx, 95% mRCA.   Carpal tunnel syndrome    Cervical radiculopathy due to degenerative joint disease of spine    Chronic kidney disease (CKD), stage IV (severe) (HCC)    Chronic use of opiate for therapeutic purpose    a.) has naloxone Rx in place   Depression    Diverticulitis    Diverticulosis    DOE (dyspnea on exertion)    Followed by palliative care service    GERD (gastroesophageal reflux disease)    H. pylori infection 06/2005   H/O bilateral cataract extraction 12/2005   HFrEF (heart failure with reduced ejection fraction) (HCC) 09/14/2021   a.) newly Dx'd. EF 35%, glob HK, sep/inf/post AK, mild LAE, triv AR/PR,  mild MR/TR, GLS -9.1%, G2DD; b.) TTE 02/14/2022: EF 60-65%, mild-mod MR, G1DD   History of spinal fracture 1990   a.) T7 and T12 fractures - traumatic sequela of a mechanical fall   HLD (hyperlipidemia)    HTN (hypertension)    Impaired glucose tolerance    LBBB (left bundle branch block)    Long term current use of anticoagulant    a.) dose reduced apixaban   Long term current use of antithrombotics/antiplatelets    a.) clopidogrel   Lumbago    Lumbar radiculopathy    Macular degeneration    NSTEMI (non-ST elevated myocardial infarction) (HCC) 09/13/2021   a.) LHC/PCI 09/15/2021: 90% mLAD (plans for staged PCI), 70% D1, 99% pLCx (2.75 x 30 mm Resolute Onyx Frontier DES), 95% mRCA (3.5 x 15 mm Resolute Onyx Frontier DES); b.) staged PCI 09/19/2021 - PTCA/DES x1 (unknown type) to mLAD   Osteoporosis    Ovarian cyst    Paresthesia of both hands    Peripheral edema    PVD (peripheral vascular disease) (HCC)    Vitamin D deficiency     Past Surgical History:  Procedure Laterality Date   ABDOMINAL HYSTERECTOMY  1972   CARPAL TUNNEL RELEASE Right 08/10/2019   CATARACT EXTRACTION Bilateral 2007   COLONOSCOPY     CORONARY ANGIOPLASTY WITH STENT PLACEMENT Left 09/15/2021   Procedure: CORONARY ANGIOPLASTY WITH STENT PLACEMENT; Location: [facility]; Surgeon: Zenovia Jarred, MD   CORONARY ANGIOPLASTY WITH STENT PLACEMENT Left 09/19/2021   Procedure:  CORONARY ANGIOPLASTY WITH STENT PLACEMENT; Location: [facility]; Surgeon: Rubie Maid, MD   LUMBAR LAMINECTOMY/DECOMPRESSION MICRODISCECTOMY Left 07/25/2022   Procedure: LEFT L4-5 LAMINOFORAMINOTOMY;  Surgeon: Venetia Night, MD;  Location: [facility] ORS;  Service: Neurosurgery;  Laterality: Left;   MASTECTOMY Right 1999   SIGMOIDOSCOPY  2004   UPPER GASTROINTESTINAL ENDOSCOPY  2006    Allergies: Alendronate, Levofloxacin, Codeine, and Propoxyphene  Medications: Prior to Admission medications   Medication Sig Start Date End Date Taking?  Authorizing Provider  acetaminophen (TYLENOL) 500 MG tablet Take 1,000 mg by mouth every 6 (six) hours as needed.    [provider]  cetirizine (ZYRTEC) 10 MG tablet Take by mouth as needed. 07/13/21 07/13/22  [provider]  Cholecalciferol (VITAMIN D3 PO) Take by mouth daily.    [provider]  clopidogrel (PLAVIX) 75 MG tablet Take 75 mg by mouth daily. 01/16/22   [provider]  cyanocobalamin 1000 MCG tablet TAKE 1 TABLET EVERY DAY FOR VITAMIN B12 DEFICIENCY 11/16/21   [provider]  ELIQUIS 2.5 MG TABS tablet Take 2.5 mg by mouth 2 (two) times daily. Patient not taking: Reported on 05/02/2023 01/29/22   [provider]  lisinopril (ZESTRIL) 2.5 MG tablet Take 2.5 mg by mouth daily. 01/16/22   [provider]  methocarbamol (ROBAXIN) 500 MG tablet Take 1 tablet (500 mg total) by mouth every 6 (six) hours as needed for muscle spasms. Patient not taking: Reported on 05/02/2023 07/26/22   Venetia Night, MD  metoprolol succinate (TOPROL-XL) 25 MG 24 hr tablet Take 25 mg by mouth daily. 01/16/22   [provider]  naloxone [employer]) nasal spray 4 mg/0.1 mL SMARTSIG:Both Nares Patient not taking: Reported on 05/02/2023 02/08/22   [provider]  nitroGLYCERIN (NITROSTAT) 0.4 MG SL tablet  01/25/22   [provider]  pantoprazole (PROTONIX) 40 MG tablet Take 1 tablet (40 mg total) by mouth daily. 02/15/22 03/17/22  Marrion Coy, MD  pregabalin (LYRICA) 150 MG capsule Take 150 mg by mouth 2 (two) times daily. 01/29/22   [provider]  senna (SENOKOT) 8.6 MG TABS tablet Take 1 tablet (8.6 mg total) by mouth 2 (two) times daily as needed for mild constipation. Patient not taking: Reported on 05/02/2023 07/26/22   Venetia Night, MD  torsemide (DEMADEX) 20 MG tablet Take 20 mg by mouth daily. 01/29/22   [provider]     Family History  Problem Relation Age of Onset   Multiple myeloma Daughter      Social History   Socioeconomic History   Marital status: Widowed    Spouse name: Not on file   Number of children: Not on file   Years of education: Not on file   Highest education level: Not on file  Occupational History   Not on file  Tobacco Use   Smoking status: Never   Smokeless tobacco: Never  Vaping Use   Vaping status: Never Used  Substance and Sexual Activity   Alcohol use: Never   Drug use: Never   Sexual activity: Not on file  Other Topics Concern   Not on file  Social History Narrative   Lives alone   Social Determinants of Health   Financial Resource Strain: Low Risk  (10/29/2017)   Received from [facility], [facility]   Overall Financial Resource Strain (CARDIA)    Difficulty of Paying Living Expenses: Not very hard  Food Insecurity: No Food Insecurity (05/02/2023)   Hunger Vital Sign  Worried About Programme researcher, broadcasting/film/video in the Last Year: Never true    Ran Out of Food in the Last Year: Never true  Transportation Needs: No Transportation Needs (05/02/2023)   PRAPARE - Administrator, Civil Service (Medical): No    Lack of Transportation (Non-Medical): No  Physical Activity: Not on file  Stress: Stress Concern Present (10/29/2017)   Received from [facility], [facility]   [employer] of Occupational Health - Occupational Stress Questionnaire    Feeling of Stress : To some extent  Social Connections: Moderately Integrated (10/29/2017)   Received from [facility], [facility]   Social Connection and Isolation Panel [NHANES]    Frequency of Communication with Friends and Family: More than three times a week    Frequency of Social Gatherings with Friends and Family: More than three times a week    Attends Religious Services: More than 4 times per year    Active Member of [employer] or Organizations: Yes    Attends Banker  Meetings: More than 4 times per year    Marital Status: Widowed    Code Status: Full Code   Review of Systems: A 12 point ROS discussed and pertinent positives are indicated in the HPI above.  All other systems are negative.  Review of Systems  Constitutional:  Negative for chills and fever.  Respiratory:  Negative for chest tightness and shortness of breath.   Cardiovascular:  Positive for leg swelling. Negative for chest pain.  Gastrointestinal:  Negative for abdominal pain, diarrhea, nausea and vomiting.  Neurological:  Negative for dizziness and headaches.  Psychiatric/Behavioral:  Negative for confusion.     Vital Signs: BP (!) 169/59   Pulse 65   Temp 98.9 F (37.2 C) (Oral)   Resp 18   Ht 5\' 4"  (1.626 m)   Wt 141 lb 1.5 oz (64 kg)   SpO2 95%   BMI 24.22 kg/m   Advance Care Plan: The advanced care plan/surrogate decision maker was discussed at the time of visit and documented in the medical record.  Patient's daughter, Cheyenne [name], identified as medical surrogate during procedure.  Physical Exam Vitals reviewed.  Constitutional:      General: She is not in acute distress.    Appearance: She is not ill-appearing.  HENT:     Mouth/Throat:     Mouth: Mucous membranes are moist.  Cardiovascular:     Rate and Rhythm: Normal rate and regular rhythm.     Pulses: Normal pulses.     Heart sounds: Normal heart sounds.  Pulmonary:     Effort: Pulmonary effort is normal.     Breath sounds: Normal breath sounds.  Abdominal:     Palpations: Abdomen is soft.     Tenderness: There is no abdominal tenderness.  Musculoskeletal:        General: No tenderness.     Right lower leg: Edema present.     Left lower leg: Edema present.     Comments: Bilateral non-pitting edema. Patient states this is normal for her  Skin:    General: Skin is warm and dry.  Neurological:     Mental Status: She is alert and oriented to person, place, and time.  Psychiatric:        Mood and  Affect: Mood normal.        Behavior: Behavior normal.        Thought Content: Thought content  normal.        Judgment: Judgment normal.     Imaging: DG Bone Survey Met  Result Date: 05/11/2023 CLINICAL DATA:  Monoclonal gammopathy history of right breast cancer EXAM: METASTATIC BONE SURVEY COMPARISON:  None Available. FINDINGS: Metastatic bone survey was performed. View of the chest shows cardiomegaly. No lytic or sclerotic lesions are seen. Postsurgical changes on the right are noted consistent with prior mastectomy. Lucency is noted within the skull stable in appearance from prior CT of 2023 and felt to be of no clinical significance. Shoulder girdle show no lytic or sclerotic lesions. Upper extremities show no lytic or sclerotic lesions. Degenerative changes of the thoracic spine are noted. Compression deformity of T7 is noted which appears chronic in nature. No other compression deformities are seen. No paraspinal mass is noted. Lumbar spine demonstrates multilevel degenerative change T12 compression deformity is noted with increased kyphosis also chronic in nature. Anterolisthesis of L4 on L5 is seen. No lytic or sclerotic lesions are noted. Cervical spine demonstrates multilevel degenerative change without acute abnormality. The pelvis demonstrates degenerative changes of the hip joints. No acute fracture is seen. No lytic or sclerotic lesion is noted. The lower extremities show no lytic or sclerotic lesions. IMPRESSION: No lytic or sclerotic lesions are noted. Electronically Signed   By: Alcide Clever M.D.   On: 05/11/2023 00:31    Labs:  CBC: Recent Labs    01/17/23 1306 05/02/23 1136  WBC 6.3 7.8  HGB 11.0* 10.2*  HCT 33.3* 31.2*  PLT 151 142*    COAGS: Recent Labs    07/25/22 0712  INR 0.9  APTT 20*    BMP: Recent Labs    01/17/23 1306 05/02/23 1136  NA 139 137  K 4.8 5.1  CL 106 108  CO2 23 22  GLUCOSE 88 89  BUN 41* 35*  CALCIUM 9.2 8.8*  CREATININE 1.91*  1.88*  GFRNONAA 24* 25*    LIVER FUNCTION TESTS: Recent Labs    05/02/23 1136  BILITOT 0.6  AST 20  ALT 12  ALKPHOS 66  PROT 7.5  ALBUMIN 4.1    TUMOR MARKERS: No results for input(s): "AFPTM", "CEA", "CA199", "CHROMGRNA" in the last 8760 hours.  Assessment and Plan:  Cheyenne [name] is a 89 yo female being seen today in relation to monoclonal gammopathy. Patient is under the care of Dr Donneta Romberg, who is concerned for possible multiple myeloma. Patient presents today in her usual state of health and is NPO. Plan for image-guided bone marrow biopsy today with moderate sedation. Case reviewed with Dr Juliette Alcide.  Risks and benefits of image-guided bone marrow biopsy was discussed with the patient and/or patient's family including, but not limited to bleeding, infection, damage to adjacent structures or low yield requiring additional tests.  All of the questions were answered and there is agreement to proceed.  Consent signed and in chart.    Thank you for this interesting consult.  I greatly enjoyed meeting Cheyenne [name] and look forward to participating in their care.  A copy of this report was sent to the requesting provider on this date.  Electronically Signed: Kennieth Francois, PA-C 05/13/2023, 9:17 AM   I spent a total of 15 Minutes   in face to face in clinical consultation, greater than 50% of which was counseling/coordinating care for monoclonal gammopathy.

## 2023-05-13 NOTE — Procedures (Signed)
Interventional Radiology Procedure Note  Date of Procedure: 05/13/2023  Procedure: BMBx   Findings:  1. BMBx right ilium    Complications: No immediate complications noted.   Estimated Blood Loss: minimal  Follow-up and Recommendations: 1. Bedrest 1 hour    Olive Bass, MD  Vascular & Interventional Radiology  05/13/2023 9:51 AM

## 2023-05-13 NOTE — Progress Notes (Signed)
Patient clinically stable post IR BMB per Dr Juliette Alcide, tolerated well. Denies complaints post procedure. Daughter at bedside with update given post procedure. Report given to Moreen Fowler Rn/18/specials post procedure/recovery. Received Versed  0.5 mg along with Fentanyl 25 mcg IV for procedure.

## 2023-05-15 DIAGNOSIS — E7800 Pure hypercholesterolemia, unspecified: Secondary | ICD-10-CM | POA: Diagnosis not present

## 2023-05-15 DIAGNOSIS — I2510 Atherosclerotic heart disease of native coronary artery without angina pectoris: Secondary | ICD-10-CM | POA: Diagnosis not present

## 2023-05-15 DIAGNOSIS — F4321 Adjustment disorder with depressed mood: Secondary | ICD-10-CM | POA: Diagnosis not present

## 2023-05-15 DIAGNOSIS — N184 Chronic kidney disease, stage 4 (severe): Secondary | ICD-10-CM | POA: Diagnosis not present

## 2023-05-15 DIAGNOSIS — I5042 Chronic combined systolic (congestive) and diastolic (congestive) heart failure: Secondary | ICD-10-CM | POA: Diagnosis not present

## 2023-05-15 DIAGNOSIS — I480 Paroxysmal atrial fibrillation: Secondary | ICD-10-CM | POA: Diagnosis not present

## 2023-05-15 LAB — SURGICAL PATHOLOGY

## 2023-05-17 ENCOUNTER — Ambulatory Visit: Payer: Medicare HMO | Admitting: Internal Medicine

## 2023-05-27 ENCOUNTER — Ambulatory Visit: Payer: Medicare HMO | Admitting: Internal Medicine

## 2023-05-28 ENCOUNTER — Encounter (HOSPITAL_COMMUNITY): Payer: Self-pay

## 2023-06-05 DIAGNOSIS — I5042 Chronic combined systolic (congestive) and diastolic (congestive) heart failure: Secondary | ICD-10-CM | POA: Diagnosis not present

## 2023-06-05 DIAGNOSIS — Z23 Encounter for immunization: Secondary | ICD-10-CM | POA: Diagnosis not present

## 2023-06-05 DIAGNOSIS — F4321 Adjustment disorder with depressed mood: Secondary | ICD-10-CM | POA: Diagnosis not present

## 2023-06-05 DIAGNOSIS — Z09 Encounter for follow-up examination after completed treatment for conditions other than malignant neoplasm: Secondary | ICD-10-CM | POA: Diagnosis not present

## 2023-06-05 DIAGNOSIS — I480 Paroxysmal atrial fibrillation: Secondary | ICD-10-CM | POA: Diagnosis not present

## 2023-06-05 DIAGNOSIS — F419 Anxiety disorder, unspecified: Secondary | ICD-10-CM | POA: Diagnosis not present

## 2023-06-05 DIAGNOSIS — E559 Vitamin D deficiency, unspecified: Secondary | ICD-10-CM | POA: Diagnosis not present

## 2023-06-05 DIAGNOSIS — I2510 Atherosclerotic heart disease of native coronary artery without angina pectoris: Secondary | ICD-10-CM | POA: Diagnosis not present

## 2023-06-05 DIAGNOSIS — E7800 Pure hypercholesterolemia, unspecified: Secondary | ICD-10-CM | POA: Diagnosis not present

## 2023-06-05 DIAGNOSIS — I130 Hypertensive heart and chronic kidney disease with heart failure and stage 1 through stage 4 chronic kidney disease, or unspecified chronic kidney disease: Secondary | ICD-10-CM | POA: Diagnosis not present

## 2023-06-07 ENCOUNTER — Encounter: Payer: Self-pay | Admitting: Internal Medicine

## 2023-06-07 ENCOUNTER — Inpatient Hospital Stay: Payer: Medicare HMO | Attending: Internal Medicine | Admitting: Internal Medicine

## 2023-06-07 DIAGNOSIS — D631 Anemia in chronic kidney disease: Secondary | ICD-10-CM | POA: Diagnosis not present

## 2023-06-07 DIAGNOSIS — N183 Chronic kidney disease, stage 3 unspecified: Secondary | ICD-10-CM | POA: Insufficient documentation

## 2023-06-07 DIAGNOSIS — D472 Monoclonal gammopathy: Secondary | ICD-10-CM | POA: Insufficient documentation

## 2023-06-07 DIAGNOSIS — I4891 Unspecified atrial fibrillation: Secondary | ICD-10-CM | POA: Insufficient documentation

## 2023-06-07 DIAGNOSIS — I509 Heart failure, unspecified: Secondary | ICD-10-CM | POA: Diagnosis not present

## 2023-06-07 DIAGNOSIS — Z807 Family history of other malignant neoplasms of lymphoid, hematopoietic and related tissues: Secondary | ICD-10-CM | POA: Insufficient documentation

## 2023-06-07 DIAGNOSIS — Z853 Personal history of malignant neoplasm of breast: Secondary | ICD-10-CM | POA: Insufficient documentation

## 2023-06-07 DIAGNOSIS — Z79899 Other long term (current) drug therapy: Secondary | ICD-10-CM | POA: Diagnosis not present

## 2023-06-07 NOTE — Patient Instructions (Signed)
#  Recommend gentle iron [iron bisglycinate; 28 mg ] 1 pill a day.  This pill is unlikely to cause stomach upset or cause constipation.

## 2023-06-07 NOTE — Progress Notes (Signed)
No concerns other than her results from Bone Marrow Bx

## 2023-06-07 NOTE — Assessment & Plan Note (Addendum)
#   JULY 2024- K/L= 42/247; Anemia [hb 10.3]; platelets- 127. No Hypercalcemia. GFR 30s. SEP 2024- BONE MARROW, ASPIRATE, CLOT, CORE:  - Normocellular bone marrow (10%) involved by plasma cell neoplasm (7%  plasma cells by manual aspirate differential and approximately 5% by  CD138 immunohistochemical analysis, and lambda restricted by  kappa/lambda in situ hybridization).  See comment.    # Recommend PET scan for further evaluation.  No immediate concerns at this time to treat underlying malignancy.  However if PET scan shows progressive disease or myeloma lesions-need to consider therapy.   # Given the ongoing anemia/chronic kidney disease I would recommend  gentle iron [iron biglycinate; 28 mg ] 1 pill a day.  This pill is unlikely to cause stomach upset or cause constipation.   # CKD- [2021-2022]- III-IV [Dr.Singh]-question related to multiple myeloma versus others.  Discussed with Dr. Thedore Mins.  # History of CHF- clinically stable.  # DISPOSITION: # follow up in 4 weeks-MD;labs- cbc/cmp; iron studies;ferritin- PET scan- Dr.B  Cc; PCP: Billy Fischer

## 2023-06-17 NOTE — Progress Notes (Signed)
[facility] CONSULT NOTE  Patient Care Team: Luciana Axe, NP as PCP - General (Family Medicine) Eliezer Lofts, NP (Inactive) as Nurse Practitioner (Hospice and Palliative Medicine) Alwyn Pea, MD as Consulting Physician (Cardiology) Luciana Axe, NP as Nurse Practitioner (Family Medicine) Earna Coder, MD as Consulting Physician (Oncology)  CHIEF COMPLAINTS/PURPOSE OF CONSULTATION: anemia/multiple myeloma.   Oncology History   No history exists.   Ref Range & Units 1 mo ago Comments  Free Kappa Lt Chains, S 3.3 - 19.4 mg/L 42.7 High    Free Lambda Lt Chains, S 5.7 - 26.3 mg/L 374.7 High    Free Kappa/Lambda Ratio 0.26 - 1.65 0.11 Low     HISTORY OF PRESENTING ILLNESS: Patient ambulating-cane/with assistance.  Accompanied by daughter.   Cheyenne [name] 89 y.o.  female patient with remote history of breast cancer [1995] with multiple medical problems including A.fib; CKD; CHF is currently referred for abnormal MM panel-is here to review the results of her bone marrow biopsy.  No concerns other than her results from Bone Marrow Biopsy.  Denies any pain from the biopsy.  Patient otherwise denies any new onset of tingling or numbness joint pains or back pain.  No weight loss.   Review of Systems  Constitutional:  Positive for malaise/fatigue. Negative for chills, diaphoresis, fever and weight loss.  HENT:  Negative for nosebleeds and sore throat.   Eyes:  Negative for double vision.  Respiratory:  Positive for cough. Negative for hemoptysis, sputum production, shortness of breath and wheezing.   Cardiovascular:  Negative for chest pain, palpitations, orthopnea and leg swelling.  Gastrointestinal:  Negative for abdominal pain, blood in stool, constipation, diarrhea, heartburn, melena, nausea and vomiting.  Genitourinary:  Negative for dysuria, frequency and urgency.  Musculoskeletal:  Positive for back pain and joint pain.  Skin:  Negative.  Negative for itching and rash.  Neurological:  Negative for dizziness, tingling, focal weakness, weakness and headaches.  Endo/Heme/Allergies:  Does not bruise/bleed easily.  Psychiatric/Behavioral:  Negative for depression. The patient is not nervous/anxious and does not have insomnia.     MEDICAL HISTORY:  Past Medical History:  Diagnosis Date   A-fib [facility])    a.) CHA2DS2VASc = 6 (age x 2, sex, HFrEF, HTN, NSTEMI);  b.) rate/rhythm maintained on oral metoprolol succinate; chronically anticoagulated with apixaban + clopidogrel   Allergic rhinitis    Anemia    Anxiety    Arthritis    Asthma    Breast cancer, right (HCC) 1995   a.) s/p surgical resection (mastectomy) + chemotherapy + 5 years endocrine therapy (tamoxifen)   CAD (coronary artery disease)    a.) LHC 09/15/2021: 90% mLAD, 70% D1, 99% pLCx, 95% mRCA.   Carpal tunnel syndrome    Cervical radiculopathy due to degenerative joint disease of spine    Chronic kidney disease (CKD), stage IV (severe) (HCC)    Chronic use of opiate for therapeutic purpose    a.) has naloxone Rx in place   Depression    Diverticulitis    Diverticulosis    DOE (dyspnea on exertion)    Followed by palliative care service    GERD (gastroesophageal reflux disease)    H. pylori infection 06/2005   H/O bilateral cataract extraction 12/2005   HFrEF (heart failure with reduced ejection fraction) (HCC) 09/14/2021   a.) newly Dx'd. EF 35%, glob HK, sep/inf/post AK, mild LAE, triv AR/PR, mild MR/TR, GLS -9.1%, G2DD; b.) TTE 02/14/2022: EF 60-65%, mild-mod  MR, G1DD   History of spinal fracture 1990   a.) T7 and T12 fractures - traumatic sequela of a mechanical fall   HLD (hyperlipidemia)    HTN (hypertension)    Impaired glucose tolerance    LBBB (left bundle branch block)    Long term current use of anticoagulant    a.) dose reduced apixaban   Long term current use of antithrombotics/antiplatelets    a.) clopidogrel   Lumbago    Lumbar  radiculopathy    Macular degeneration    NSTEMI (non-ST elevated myocardial infarction) (HCC) 09/13/2021   a.) LHC/PCI 09/15/2021: 90% mLAD (plans for staged PCI), 70% D1, 99% pLCx (2.75 x 30 mm Resolute Onyx Frontier DES), 95% mRCA (3.5 x 15 mm Resolute Onyx Frontier DES); b.) staged PCI 09/19/2021 - PTCA/DES x1 (unknown type) to mLAD   Osteoporosis    Ovarian cyst    Paresthesia of both hands    Peripheral edema    PVD (peripheral vascular disease) (HCC)    Vitamin D deficiency     SURGICAL HISTORY: Past Surgical History:  Procedure Laterality Date   ABDOMINAL HYSTERECTOMY  1972   CARPAL TUNNEL RELEASE Right 08/10/2019   CATARACT EXTRACTION Bilateral 2007   COLONOSCOPY     CORONARY ANGIOPLASTY WITH STENT PLACEMENT Left 09/15/2021   Procedure: CORONARY ANGIOPLASTY WITH STENT PLACEMENT; Location: [facility]; Surgeon: Zenovia Jarred, MD   CORONARY ANGIOPLASTY WITH STENT PLACEMENT Left 09/19/2021   Procedure: CORONARY ANGIOPLASTY WITH STENT PLACEMENT; Location: [facility]; Surgeon: Rubie Maid, MD   IR BONE MARROW BIOPSY & ASPIRATION  05/13/2023   LUMBAR LAMINECTOMY/DECOMPRESSION MICRODISCECTOMY Left 07/25/2022   Procedure: LEFT L4-5 LAMINOFORAMINOTOMY;  Surgeon: Venetia Night, MD;  Location: [facility];  Service: Neurosurgery;  Laterality: Left;   MASTECTOMY Right 1999   SIGMOIDOSCOPY  2004   UPPER GASTROINTESTINAL ENDOSCOPY  2006    SOCIAL HISTORY: Social History   Socioeconomic History   Marital status: Widowed    Spouse name: Not on file   Number of children: Not on file   Years of education: Not on file   Highest education level: Not on file  Occupational History   Not on file  Tobacco Use   Smoking status: Never   Smokeless tobacco: Never  Vaping Use   Vaping status: Never Used  Substance and Sexual Activity   Alcohol use: Never   Drug use: Never   Sexual activity: Not on file  Other Topics Concern   Not on file  Social History Narrative   Lives alone   Social  Determinants of Health   Financial Resource Strain: Low Risk  (10/29/2017)   Received from [facility], [facility]   Overall Financial Resource Strain (CARDIA)    Difficulty of Paying Living Expenses: Not very hard  Food Insecurity: No Food Insecurity (05/02/2023)   Hunger Vital Sign    Worried About Running Out of Food in the Last Year: Never true    Ran Out of Food in the Last Year: Never true  Transportation Needs: No Transportation Needs (05/02/2023)   PRAPARE - Administrator, Civil Service (Medical): No    Lack of Transportation (Non-Medical): No  Physical Activity: Not on file  Stress: Stress Concern Present (10/29/2017)   Received from [facility], [facility]   [employer] of Occupational Health - Occupational Stress Questionnaire    Feeling of Stress : To some extent  Social Connections: Moderately Integrated (10/29/2017)   Received  from [facility], [facility]   Social Connection and Isolation Panel [NHANES]    Frequency of Communication with Friends and Family: More than three times a week    Frequency of Social Gatherings with Friends and Family: More than three times a week    Attends Religious Services: More than 4 times per year    Active Member of [employer] or Organizations: Yes    Attends Banker Meetings: More than 4 times per year    Marital Status: Widowed  Intimate Partner Violence: Not At Risk (05/02/2023)   Humiliation, Afraid, Rape, and Kick questionnaire    Fear of Current or Ex-Partner: No    Emotionally Abused: No    Physically Abused: No    Sexually Abused: No    FAMILY HISTORY: Family History  Problem Relation Age of Onset   Multiple myeloma Daughter     ALLERGIES:  is allergic to alendronate, levofloxacin, codeine, and propoxyphene.  MEDICATIONS:  Current Outpatient Medications  Medication Sig Dispense Refill    acetaminophen (TYLENOL) 500 MG tablet Take 1,000 mg by mouth every 6 (six) hours as needed.     cetirizine (ZYRTEC) 10 MG tablet Take by mouth as needed.     Cholecalciferol (VITAMIN D3 PO) Take by mouth daily.     clopidogrel (PLAVIX) 75 MG tablet Take 75 mg by mouth daily.     cyanocobalamin 1000 MCG tablet TAKE 1 TABLET EVERY DAY FOR VITAMIN B12 DEFICIENCY     fluticasone (FLONASE) 50 MCG/ACT nasal spray Place 1 spray into both nostrils daily.     ipratropium (ATROVENT) 0.06 % nasal spray Place 2 sprays into the nose 3 (three) times daily.     ipratropium-albuterol (DUONEB) 0.5-2.5 (3) MG/3ML SOLN Inhale 3 mLs into the lungs every 4 (four) hours as needed.     lisinopril (ZESTRIL) 2.5 MG tablet Take 2.5 mg by mouth daily.     metoprolol succinate (TOPROL-XL) 25 MG 24 hr tablet Take 25 mg by mouth daily.     Multiple Vitamins-Minerals (PRESERVISION AREDS 2) CAPS Take 1 capsule by mouth daily.     nitroGLYCERIN (NITROSTAT) 0.4 MG SL tablet      omeprazole (PRILOSEC) 40 MG capsule Take 40 mg by mouth daily.     pregabalin (LYRICA) 150 MG capsule Take 150 mg by mouth 2 (two) times daily.     torsemide (DEMADEX) 20 MG tablet Take 20 mg by mouth daily.     methocarbamol (ROBAXIN) 500 MG tablet Take 1 tablet (500 mg total) by mouth every 6 (six) hours as needed for muscle spasms. (Patient not taking: Reported on 05/02/2023) 60 tablet 0   naloxone (NARCAN) nasal spray 4 mg/0.1 mL SMARTSIG:Both Nares (Patient not taking: Reported on 05/02/2023)     senna (SENOKOT) 8.6 MG TABS tablet Take 1 tablet (8.6 mg total) by mouth 2 (two) times daily as needed for mild constipation. (Patient not taking: Reported on 05/02/2023) 120 tablet 0   No current facility-administered medications for this visit.    PHYSICAL EXAMINATION:  Vitals:   06/07/23 1458  BP: (!) 133/46  Pulse: 64  Resp: 16  Temp: 98 F (36.7 C)  SpO2: 96%   Filed Weights   06/07/23 1458  Weight: 140 lb (63.5 kg)    Physical  Exam Vitals and nursing note reviewed.  HENT:     Head: Normocephalic and atraumatic.     Mouth/Throat:     Pharynx: Oropharynx is clear.  Eyes:  Extraocular Movements: Extraocular movements intact.     Pupils: Pupils are equal, round, and reactive to light.  Cardiovascular:     Rate and Rhythm: Normal rate and regular rhythm.  Pulmonary:     Comments: Decreased breath sounds bilaterally.  Abdominal:     Palpations: Abdomen is soft.  Musculoskeletal:        General: Normal range of motion.     Cervical back: Normal range of motion.  Skin:    General: Skin is warm.  Neurological:     General: No focal deficit present.     Mental Status: She is alert and oriented to person, place, and time.  Psychiatric:        Behavior: Behavior normal.        Judgment: Judgment normal.     LABORATORY DATA:  I have reviewed the data as listed Lab Results  Component Value Date   WBC 6.9 05/13/2023   HGB 11.4 (L) 05/13/2023   HCT 34.6 (L) 05/13/2023   MCV 96.4 05/13/2023   PLT 171 05/13/2023   Recent Labs    01/17/23 1306 05/02/23 1136  NA 139 137  K 4.8 5.1  CL 106 108  CO2 23 22  GLUCOSE 88 89  BUN 41* 35*  CREATININE 1.91* 1.88*  CALCIUM 9.2 8.8*  GFRNONAA 24* 25*  PROT  --  7.5  ALBUMIN  --  4.1  AST  --  20  ALT  --  12  ALKPHOS  --  66  BILITOT  --  0.6    RADIOGRAPHIC STUDIES: I have personally reviewed the radiological images as listed and agreed with the findings in the report. No results found.   Monoclonal gammopathies # JULY 2024- K/L= 42/247; Anemia [hb 10.3]; platelets- 127. No Hypercalcemia. GFR 30s. SEP 2024- BONE MARROW, ASPIRATE, CLOT, CORE:  - Normocellular bone marrow (10%) involved by plasma cell neoplasm (7%  plasma cells by manual aspirate differential and approximately 5% by  CD138 immunohistochemical analysis, and lambda restricted by  kappa/lambda in situ hybridization).  See comment.    # Recommend PET scan for further evaluation.  No  immediate concerns at this time to treat underlying malignancy.  However if PET scan shows progressive disease or myeloma lesions-need to consider therapy.   # Given the ongoing anemia/chronic kidney disease I would recommend  gentle iron [iron biglycinate; 28 mg ] 1 pill a day.  This pill is unlikely to cause stomach upset or cause constipation.   # CKD- [2021-2022]- III-IV [Dr.Singh]-question related to multiple myeloma versus others.  Discussed with Dr. Thedore Mins.  # History of CHF- clinically stable.  # DISPOSITION: # follow up in 4 weeks-MD;labs- cbc/cmp; iron studies;ferritin- PET scan- Dr.B  Cc; PCP: Billy Fischer  Above plan of care was discussed with patient/family in detail.  My contact information was given to the patient/family.    Earna Coder, MD 06/17/2023 3:03 PM

## 2023-06-26 ENCOUNTER — Ambulatory Visit
Admission: RE | Admit: 2023-06-26 | Discharge: 2023-06-26 | Disposition: A | Discharge status: Home / Self Care | Payer: Medicare HMO | Source: Ambulatory Visit | Attending: Internal Medicine | Admitting: Internal Medicine

## 2023-06-26 DIAGNOSIS — E119 Type 2 diabetes mellitus without complications: Secondary | ICD-10-CM | POA: Diagnosis not present

## 2023-06-26 DIAGNOSIS — N83202 Unspecified ovarian cyst, left side: Secondary | ICD-10-CM | POA: Insufficient documentation

## 2023-06-26 DIAGNOSIS — Z9011 Acquired absence of right breast and nipple: Secondary | ICD-10-CM | POA: Insufficient documentation

## 2023-06-26 DIAGNOSIS — D472 Monoclonal gammopathy: Secondary | ICD-10-CM | POA: Insufficient documentation

## 2023-06-26 LAB — GLUCOSE, CAPILLARY: Glucose-Capillary: 89 mg/dL (ref 70–99)

## 2023-06-26 MED ORDER — FLUDEOXYGLUCOSE F - 18 (FDG) INJECTION
7.3000 | Freq: Once | INTRAVENOUS | Status: AC | PRN
  Administered 2023-06-26: 7.67 via INTRAVENOUS

## 2023-07-02 ENCOUNTER — Other Ambulatory Visit: Payer: Self-pay | Admitting: *Deleted

## 2023-07-02 DIAGNOSIS — D472 Monoclonal gammopathy: Secondary | ICD-10-CM

## 2023-07-05 ENCOUNTER — Other Ambulatory Visit: Payer: Medicare HMO

## 2023-07-05 ENCOUNTER — Ambulatory Visit: Payer: Medicare HMO | Admitting: Internal Medicine

## 2023-07-09 ENCOUNTER — Encounter: Payer: Self-pay | Admitting: Internal Medicine

## 2023-07-09 ENCOUNTER — Inpatient Hospital Stay: Payer: Medicare HMO | Attending: Internal Medicine

## 2023-07-09 ENCOUNTER — Inpatient Hospital Stay (HOSPITAL_BASED_OUTPATIENT_CLINIC_OR_DEPARTMENT_OTHER): Payer: Medicare HMO | Admitting: Internal Medicine

## 2023-07-09 VITALS — BP 146/55 | HR 64 | Temp 97.6°F | Resp 18 | Wt 144.4 lb

## 2023-07-09 DIAGNOSIS — Z79899 Other long term (current) drug therapy: Secondary | ICD-10-CM | POA: Diagnosis not present

## 2023-07-09 DIAGNOSIS — Z9221 Personal history of antineoplastic chemotherapy: Secondary | ICD-10-CM | POA: Diagnosis not present

## 2023-07-09 DIAGNOSIS — D472 Monoclonal gammopathy: Secondary | ICD-10-CM | POA: Insufficient documentation

## 2023-07-09 DIAGNOSIS — Z9011 Acquired absence of right breast and nipple: Secondary | ICD-10-CM | POA: Diagnosis not present

## 2023-07-09 DIAGNOSIS — I4891 Unspecified atrial fibrillation: Secondary | ICD-10-CM | POA: Insufficient documentation

## 2023-07-09 DIAGNOSIS — N184 Chronic kidney disease, stage 4 (severe): Secondary | ICD-10-CM | POA: Insufficient documentation

## 2023-07-09 DIAGNOSIS — Z853 Personal history of malignant neoplasm of breast: Secondary | ICD-10-CM | POA: Diagnosis not present

## 2023-07-09 DIAGNOSIS — D631 Anemia in chronic kidney disease: Secondary | ICD-10-CM | POA: Insufficient documentation

## 2023-07-09 DIAGNOSIS — I509 Heart failure, unspecified: Secondary | ICD-10-CM | POA: Insufficient documentation

## 2023-07-09 DIAGNOSIS — D649 Anemia, unspecified: Secondary | ICD-10-CM | POA: Diagnosis not present

## 2023-07-09 LAB — CMP (CANCER CENTER ONLY)
ALT: 11 U/L (ref 0–44)
AST: 19 U/L (ref 15–41)
Albumin: 4 g/dL (ref 3.5–5.0)
Alkaline Phosphatase: 64 U/L (ref 38–126)
Anion gap: 6 (ref 5–15)
BUN: 50 mg/dL — ABNORMAL HIGH (ref 8–23)
CO2: 25 mmol/L (ref 22–32)
Calcium: 8.8 mg/dL — ABNORMAL LOW (ref 8.9–10.3)
Chloride: 109 mmol/L (ref 98–111)
Creatinine: 1.69 mg/dL — ABNORMAL HIGH (ref 0.44–1.00)
GFR, Estimated: 28 mL/min — ABNORMAL LOW (ref >60)
Glucose, Bld: 91 mg/dL (ref 70–99)
Potassium: 5.5 mmol/L — ABNORMAL HIGH (ref 3.5–5.1)
Sodium: 140 mmol/L (ref 135–145)
Total Bilirubin: 0.4 mg/dL (ref 0.3–1.2)
Total Protein: 7.2 g/dL (ref 6.5–8.1)

## 2023-07-09 LAB — CBC WITH DIFFERENTIAL (CANCER CENTER ONLY)
Abs Immature Granulocytes: 0.01 10*3/uL (ref 0.00–0.07)
Basophils Absolute: 0 10*3/uL (ref 0.0–0.1)
Basophils Relative: 1 %
Eosinophils Absolute: 0.3 10*3/uL (ref 0.0–0.5)
Eosinophils Relative: 6 %
HCT: 32.7 % — ABNORMAL LOW (ref 36.0–46.0)
Hemoglobin: 10.7 g/dL — ABNORMAL LOW (ref 12.0–15.0)
Immature Granulocytes: 0 %
Lymphocytes Relative: 33 %
Lymphs Abs: 1.9 10*3/uL (ref 0.7–4.0)
MCH: 31.6 pg (ref 26.0–34.0)
MCHC: 32.7 g/dL (ref 30.0–36.0)
MCV: 96.5 fL (ref 80.0–100.0)
Monocytes Absolute: 0.5 10*3/uL (ref 0.1–1.0)
Monocytes Relative: 9 %
Neutro Abs: 2.9 10*3/uL (ref 1.7–7.7)
Neutrophils Relative %: 51 %
Platelet Count: 138 10*3/uL — ABNORMAL LOW (ref 150–400)
RBC: 3.39 MIL/uL — ABNORMAL LOW (ref 3.87–5.11)
RDW: 14.6 % (ref 11.5–15.5)
WBC Count: 5.7 10*3/uL (ref 4.0–10.5)
nRBC: 0 % (ref 0.0–0.2)

## 2023-07-09 LAB — IRON AND TIBC
Iron: 80 ug/dL (ref 28–170)
Saturation Ratios: 26 % (ref 10.4–31.8)
TIBC: 309 ug/dL (ref 250–450)
UIBC: 229 ug/dL

## 2023-07-09 LAB — FERRITIN: Ferritin: 69 ng/mL (ref 11–307)

## 2023-07-09 NOTE — Assessment & Plan Note (Addendum)
#   JULY 2024- K/L= 42/247; Anemia [hb 10.3]; platelets- 127. No Hypercalcemia. GFR 30s. 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  Bone marrow- Scant iron. See below genetics: FISH: 13 q; 1q gain. # OCT 2024- PET scan: No findings suspicious for active myeloma.   # Discussed with the patient and daughter-given her renal dysfunction and also anemia in context of bone marrow biopsy multiple myeloma will be considered active-however -the context of age surveillance could be considered.  And plan proceed with treatment if patient noted to have worsening renal function or anemia. For now continue with   # OCT 2024- Bone marrow [scant Iron]- Given the ongoing anemia/chronic kidney disease - continue gentle iron [iron biglycinate; 28 mg ] 1 pill a day.  This pill is unlikely to cause stomach upset or cause constipation.   # CKD- [2021-2022]- III-IV [Dr.Singh]-question related to multiple myeloma versus others.  Discussed with Dr. Thedore Mins.  # History of CHF- clinically stable.  #Incidental findings on Imaging  PET-CT , 2024:  4.5 cm simple left ovarian cyst, non FDG avid. Given the patient's age, no follow-up is recommended. I reviewed/discussed/counseled the patient.   # Vaccination: flu shot [2024]; recommend pneumonia shot [as per PCP]  # DISPOSITION: # follow up in 3 months- MD;2 weeks prior labs- cbc/cmp; iron studies;ferritin; MM panel; K/l light chains; [facility]' possible venofer--  Dr.B  # I reviewed the blood work- with the patient in detail; also reviewed the imaging independently [as summarized above]; and with the patient in detail.    Cc; PCP: Billy Fischer

## 2023-07-09 NOTE — Progress Notes (Signed)
Feels like she has more energy. No dizziness. No blood in stool. Stool is dark in color from the iron pills. Appetite is good.

## 2023-07-09 NOTE — Progress Notes (Signed)
[facility] CONSULT NOTE  Patient Care Team: Luciana Axe, NP as PCP - General (Family Medicine) Eliezer Lofts, NP (Inactive) as Nurse Practitioner (Hospice and Palliative Medicine) Alwyn Pea, MD as Consulting Physician (Cardiology) Luciana Axe, NP as Nurse Practitioner (Family Medicine) Earna Coder, MD as Consulting Physician (Oncology)  CHIEF COMPLAINTS/PURPOSE OF CONSULTATION: anemia/multiple myeloma.   Oncology History   No history exists.   Ref Range & Units 1 mo ago Comments  Free Kappa Lt Chains, S 3.3 - 19.4 mg/L 42.7 High    Free Lambda Lt Chains, S 5.7 - 26.3 mg/L 374.7 High    Free Kappa/Lambda Ratio 0.26 - 1.65 0.11 Low     HISTORY OF PRESENTING ILLNESS: Patient ambulating-cane/with assistance.  Accompanied by daughter.   Cheyenne [name] 89 y.o.  female patient with remote history of breast cancer [1995] with multiple medical problems including A.fib; anemia/CKD; CHF with monoclonal gammopathy-s/p bone marrow biopsy is here to review results of her PET scan  Patient otherwise denies any new onset of tingling or numbness joint pains or back pain.  No weight loss.   Review of Systems  Constitutional:  Positive for malaise/fatigue. Negative for chills, diaphoresis, fever and weight loss.  HENT:  Negative for nosebleeds and sore throat.   Eyes:  Negative for double vision.  Respiratory:  Positive for cough. Negative for hemoptysis, sputum production, shortness of breath and wheezing.   Cardiovascular:  Negative for chest pain, palpitations, orthopnea and leg swelling.  Gastrointestinal:  Negative for abdominal pain, blood in stool, constipation, diarrhea, heartburn, melena, nausea and vomiting.  Genitourinary:  Negative for dysuria, frequency and urgency.  Musculoskeletal:  Positive for back pain and joint pain.  Skin: Negative.  Negative for itching and rash.  Neurological:  Negative for dizziness, tingling, focal  weakness, weakness and headaches.  Endo/Heme/Allergies:  Does not bruise/bleed easily.  Psychiatric/Behavioral:  Negative for depression. The patient is not nervous/anxious and does not have insomnia.     MEDICAL HISTORY:  Past Medical History:  Diagnosis Date   A-fib [facility])    a.) CHA2DS2VASc = 6 (age x 2, sex, HFrEF, HTN, NSTEMI);  b.) rate/rhythm maintained on oral metoprolol succinate; chronically anticoagulated with apixaban + clopidogrel   Allergic rhinitis    Anemia    Anxiety    Arthritis    Asthma    Breast cancer, right (HCC) 1995   a.) s/p surgical resection (mastectomy) + chemotherapy + 5 years endocrine therapy (tamoxifen)   CAD (coronary artery disease)    a.) LHC 09/15/2021: 90% mLAD, 70% D1, 99% pLCx, 95% mRCA.   Carpal tunnel syndrome    Cervical radiculopathy due to degenerative joint disease of spine    Chronic kidney disease (CKD), stage IV (severe) (HCC)    Chronic use of opiate for therapeutic purpose    a.) has naloxone Rx in place   Depression    Diverticulitis    Diverticulosis    DOE (dyspnea on exertion)    Followed by palliative care service    GERD (gastroesophageal reflux disease)    H. pylori infection 06/2005   H/O bilateral cataract extraction 12/2005   HFrEF (heart failure with reduced ejection fraction) (HCC) 09/14/2021   a.) newly Dx'd. EF 35%, glob HK, sep/inf/post AK, mild LAE, triv AR/PR, mild MR/TR, GLS -9.1%, G2DD; b.) TTE 02/14/2022: EF 60-65%, mild-mod MR, G1DD   History of spinal fracture 1990   a.) T7 and T12 fractures - traumatic sequela of  a mechanical fall   HLD (hyperlipidemia)    HTN (hypertension)    Impaired glucose tolerance    LBBB (left bundle branch block)    Long term current use of anticoagulant    a.) dose reduced apixaban   Long term current use of antithrombotics/antiplatelets    a.) clopidogrel   Lumbago    Lumbar radiculopathy    Macular degeneration    NSTEMI (non-ST elevated myocardial infarction) (HCC)  09/13/2021   a.) LHC/PCI 09/15/2021: 90% mLAD (plans for staged PCI), 70% D1, 99% pLCx (2.75 x 30 mm Resolute Onyx Frontier DES), 95% mRCA (3.5 x 15 mm Resolute Onyx Frontier DES); b.) staged PCI 09/19/2021 - PTCA/DES x1 (unknown type) to mLAD   Osteoporosis    Ovarian cyst    Paresthesia of both hands    Peripheral edema    PVD (peripheral vascular disease) (HCC)    Vitamin D deficiency     SURGICAL HISTORY: Past Surgical History:  Procedure Laterality Date   ABDOMINAL HYSTERECTOMY  1972   CARPAL TUNNEL RELEASE Right 08/10/2019   CATARACT EXTRACTION Bilateral 2007   COLONOSCOPY     CORONARY ANGIOPLASTY WITH STENT PLACEMENT Left 09/15/2021   Procedure: CORONARY ANGIOPLASTY WITH STENT PLACEMENT; Location: [facility]; Surgeon: Zenovia Jarred, MD   CORONARY ANGIOPLASTY WITH STENT PLACEMENT Left 09/19/2021   Procedure: CORONARY ANGIOPLASTY WITH STENT PLACEMENT; Location: [facility]; Surgeon: Rubie Maid, MD   IR BONE MARROW BIOPSY & ASPIRATION  05/13/2023   LUMBAR LAMINECTOMY/DECOMPRESSION MICRODISCECTOMY Left 07/25/2022   Procedure: LEFT L4-5 LAMINOFORAMINOTOMY;  Surgeon: Venetia Night, MD;  Location: [facility];  Service: Neurosurgery;  Laterality: Left;   MASTECTOMY Right 1999   SIGMOIDOSCOPY  2004   UPPER GASTROINTESTINAL ENDOSCOPY  2006    SOCIAL HISTORY: Social History   Socioeconomic History   Marital status: Widowed    Spouse name: Not on file   Number of children: Not on file   Years of education: Not on file   Highest education level: Not on file  Occupational History   Not on file  Tobacco Use   Smoking status: Never   Smokeless tobacco: Never  Vaping Use   Vaping status: Never Used  Substance and Sexual Activity   Alcohol use: Never   Drug use: Never   Sexual activity: Not on file  Other Topics Concern   Not on file  Social History Narrative   Lives alone   Social Determinants of Health   Financial Resource Strain: Low Risk  (10/29/2017)   Received from [facility], [facility]   Overall Financial Resource Strain (CARDIA)    Difficulty of Paying Living Expenses: Not very hard  Food Insecurity: No Food Insecurity (05/02/2023)   Hunger Vital Sign    Worried About Running Out of Food in the Last Year: Never true    Ran Out of Food in the Last Year: Never true  Transportation Needs: No Transportation Needs (05/02/2023)   PRAPARE - Administrator, Civil Service (Medical): No    Lack of Transportation (Non-Medical): No  Physical Activity: Not on file  Stress: Stress Concern Present (10/29/2017)   Received from [facility], [facility]   [employer] of Occupational Health - Occupational Stress Questionnaire    Feeling of Stress : To some extent  Social Connections: Moderately Integrated (10/29/2017)   Received from [facility], [facility]   Social Connection and Isolation Panel [facility]  Frequency of Communication with Friends and Family: More than three times a week    Frequency of Social Gatherings with Friends and Family: More than three times a week    Attends Religious Services: More than 4 times per year    Active Member of [employer] or Organizations: Yes    Attends Banker Meetings: More than 4 times per year    Marital Status: Widowed  Intimate Partner Violence: Not At Risk (05/02/2023)   Humiliation, Afraid, Rape, and Kick questionnaire    Fear of Current or Ex-Partner: No    Emotionally Abused: No    Physically Abused: No    Sexually Abused: No    FAMILY HISTORY: Family History  Problem Relation Age of Onset   Multiple myeloma Daughter     ALLERGIES:  is allergic to alendronate, levofloxacin, codeine, and propoxyphene.  MEDICATIONS:  Current Outpatient Medications  Medication Sig Dispense Refill   Cholecalciferol (VITAMIN D3 PO) Take by mouth daily.     clopidogrel (PLAVIX) 75 MG tablet Take  75 mg by mouth daily.     cyanocobalamin 1000 MCG tablet TAKE 1 TABLET EVERY DAY FOR VITAMIN B12 DEFICIENCY     fexofenadine (ALLEGRA) 180 MG tablet Take 180 mg by mouth daily.     fluticasone (FLONASE) 50 MCG/ACT nasal spray Place 1 spray into both nostrils daily.     ipratropium (ATROVENT) 0.06 % nasal spray Place 2 sprays into the nose 3 (three) times daily.     ipratropium-albuterol (DUONEB) 0.5-2.5 (3) MG/3ML SOLN Inhale 3 mLs into the lungs every 4 (four) hours as needed.     lisinopril (ZESTRIL) 2.5 MG tablet Take 2.5 mg by mouth daily.     metoprolol succinate (TOPROL-XL) 25 MG 24 hr tablet Take 25 mg by mouth daily.     Multiple Vitamins-Minerals (PRESERVISION AREDS 2) CAPS Take 1 capsule by mouth daily.     omeprazole (PRILOSEC) 40 MG capsule Take 40 mg by mouth daily.     torsemide (DEMADEX) 20 MG tablet Take 20 mg by mouth daily.     acetaminophen (TYLENOL) 500 MG tablet Take 1,000 mg by mouth every 6 (six) hours as needed.     naloxone (NARCAN) nasal spray 4 mg/0.1 mL SMARTSIG:Both Nares (Patient not taking: Reported on 05/02/2023)     nitroGLYCERIN (NITROSTAT) 0.4 MG SL tablet  (Patient not taking: Reported on 07/09/2023)     No current facility-administered medications for this visit.    PHYSICAL EXAMINATION:  Vitals:   07/09/23 1315  BP: (!) 146/55  Pulse: 64  Resp: 18  Temp: 97.6 F (36.4 C)  SpO2: 99%    Filed Weights   07/09/23 1315  Weight: 144 lb 6.4 oz (65.5 kg)     Physical Exam Vitals and nursing note reviewed.  HENT:     Head: Normocephalic and atraumatic.     Mouth/Throat:     Pharynx: Oropharynx is clear.  Eyes:     Extraocular Movements: Extraocular movements intact.     Pupils: Pupils are equal, round, and reactive to light.  Cardiovascular:     Rate and Rhythm: Normal rate and regular rhythm.  Pulmonary:     Comments: Decreased breath sounds bilaterally.  Abdominal:     Palpations: Abdomen is soft.  Musculoskeletal:        General:  Normal range of motion.     Cervical back: Normal range of motion.  Skin:    General: Skin is warm.  Neurological:  General: No focal deficit present.     Mental Status: She is alert and oriented to person, place, and time.  Psychiatric:        Behavior: Behavior normal.        Judgment: Judgment normal.     LABORATORY DATA:  I have reviewed the data as listed Lab Results  Component Value Date   WBC 5.7 07/09/2023   HGB 10.7 (L) 07/09/2023   HCT 32.7 (L) 07/09/2023   MCV 96.5 07/09/2023   PLT 138 (L) 07/09/2023   Recent Labs    01/17/23 1306 05/02/23 1136 07/09/23 1244  NA 139 137 140  K 4.8 5.1 5.5*  CL 106 108 109  CO2 23 22 25   GLUCOSE 88 89 91  BUN 41* 35* 50*  CREATININE 1.91* 1.88* 1.69*  CALCIUM 9.2 8.8* 8.8*  GFRNONAA 24* 25* 28*  PROT  --  7.5 7.2  ALBUMIN  --  4.1 4.0  AST  --  20 19  ALT  --  12 11  ALKPHOS  --  66 64  BILITOT  --  0.6 0.4    RADIOGRAPHIC STUDIES: I have personally reviewed the radiological images as listed and agreed with the findings in the report. NM PET Image Initial (PI) Whole Body (F-18 FDG)  Result Date: 07/06/2023 CLINICAL DATA:  Initial treatment strategy for monoclonal gammopathy. EXAM: NUCLEAR MEDICINE PET WHOLE BODY TECHNIQUE: 7.7 mCi F-18 FDG was injected intravenously. Full-ring PET imaging was performed from the head to foot after the radiotracer. CT data was obtained and used for attenuation correction and anatomic localization. Fasting blood glucose: 89 mg/dl COMPARISON:  None Available. FINDINGS: Mediastinal blood pool activity: SUV max 3.4 HEAD/NECK: No hypermetabolic activity in the scalp. No hypermetabolic cervical lymph nodes. Incidental CT findings: none CHEST: No hypermetabolic mediastinal or hilar nodes. No suspicious pulmonary nodules on the CT scan. Incidental CT findings: Atherosclerotic calcifications of the aortic arch. Severe three-vessel coronary atherosclerosis. ABDOMEN/PELVIS: No abnormal hypermetabolic  activity within the liver, pancreas, adrenal glands, or spleen. No hypermetabolic lymph nodes in the abdomen or pelvis. Incidental CT findings: Sigmoid diverticulosis, without evidence of diverticulitis. Status post hysterectomy. 4.5 cm simple left ovarian cyst (series 6/image 138), non FDG avid. Atherosclerotic calcifications of the abdominal aorta and branch vessels. SKELETON: No focal hypermetabolic activity to suggest skeletal metastasis. No lytic lesions on CT. Incidental CT findings: Mild degenerative changes of the visualized thoracolumbar spine. EXTREMITIES: No abnormal hypermetabolic activity in the lower extremities. Incidental CT findings: none IMPRESSION: No findings suspicious for active myeloma. 4.5 cm simple left ovarian cyst, non FDG avid. Given the patient's age, no follow-up is recommended. Electronically Signed   By: Charline Bills M.D.   On: 07/06/2023 01:54     Monoclonal gammopathies # JULY 2024- K/L= 42/247; Anemia [hb 10.3]; platelets- 127. No Hypercalcemia. GFR 30s. 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  Bone marrow- Scant iron. See below genetics: FISH: 13 q; 1q gain. # OCT 2024- PET scan: No findings suspicious for active myeloma.   # Discussed with the patient and daughter-given her renal dysfunction and also anemia in context of bone marrow biopsy multiple myeloma will be considered active-however -the context of age surveillance could be considered.  And plan proceed with treatment if patient noted to have worsening renal  function or anemia. For now continue with   # OCT 2024- Bone marrow [scant Iron]- Given the ongoing anemia/chronic kidney disease - continue gentle iron [iron biglycinate; 28 mg ] 1 pill a day.  This pill is  unlikely to cause stomach upset or cause constipation.   # CKD- [2021-2022]- III-IV [Dr.Singh]-question related to multiple myeloma versus others.  Discussed with Dr. Thedore Mins.  # History of CHF- clinically stable.  #Incidental findings on Imaging  PET-CT , 2024:  4.5 cm simple left ovarian cyst, non FDG avid. Given the patient's age, no follow-up is recommended. I reviewed/discussed/counseled the patient.   # Vaccination: flu shot [2024]; recommend pneumonia shot [as per PCP]  # DISPOSITION: # follow up in 3 months- MD;2 weeks prior labs- cbc/cmp; iron studies;ferritin; MM panel; K/l light chains; [facility]' possible venofer--  Dr.B  # I reviewed the blood work- with the patient in detail; also reviewed the imaging independently [as summarized above]; and with the patient in detail.    Cc; PCP: Billy Fischer   Above plan of care was discussed with patient/family in detail.  My contact information was given to the patient/family.    Earna Coder, MD 07/09/2023 2:04 PM

## 2023-07-12 DIAGNOSIS — I480 Paroxysmal atrial fibrillation: Secondary | ICD-10-CM | POA: Diagnosis not present

## 2023-07-12 DIAGNOSIS — I2510 Atherosclerotic heart disease of native coronary artery without angina pectoris: Secondary | ICD-10-CM | POA: Diagnosis not present

## 2023-07-12 DIAGNOSIS — I739 Peripheral vascular disease, unspecified: Secondary | ICD-10-CM | POA: Diagnosis not present

## 2023-07-12 DIAGNOSIS — I5042 Chronic combined systolic (congestive) and diastolic (congestive) heart failure: Secondary | ICD-10-CM | POA: Diagnosis not present

## 2023-07-12 DIAGNOSIS — F4321 Adjustment disorder with depressed mood: Secondary | ICD-10-CM | POA: Diagnosis not present

## 2023-07-12 DIAGNOSIS — E7800 Pure hypercholesterolemia, unspecified: Secondary | ICD-10-CM | POA: Diagnosis not present

## 2023-07-12 DIAGNOSIS — N184 Chronic kidney disease, stage 4 (severe): Secondary | ICD-10-CM | POA: Diagnosis not present

## 2023-07-17 DIAGNOSIS — E875 Hyperkalemia: Secondary | ICD-10-CM | POA: Diagnosis not present

## 2023-07-17 DIAGNOSIS — N184 Chronic kidney disease, stage 4 (severe): Secondary | ICD-10-CM | POA: Diagnosis not present

## 2023-07-17 DIAGNOSIS — C9000 Multiple myeloma not having achieved remission: Secondary | ICD-10-CM | POA: Diagnosis not present

## 2023-07-17 DIAGNOSIS — I129 Hypertensive chronic kidney disease with stage 1 through stage 4 chronic kidney disease, or unspecified chronic kidney disease: Secondary | ICD-10-CM | POA: Diagnosis not present

## 2023-07-29 DIAGNOSIS — I480 Paroxysmal atrial fibrillation: Secondary | ICD-10-CM | POA: Diagnosis not present

## 2023-07-29 DIAGNOSIS — I2510 Atherosclerotic heart disease of native coronary artery without angina pectoris: Secondary | ICD-10-CM | POA: Diagnosis not present

## 2023-07-29 DIAGNOSIS — N182 Chronic kidney disease, stage 2 (mild): Secondary | ICD-10-CM | POA: Diagnosis not present

## 2023-07-29 DIAGNOSIS — R600 Localized edema: Secondary | ICD-10-CM | POA: Diagnosis not present

## 2023-07-29 DIAGNOSIS — I10 Essential (primary) hypertension: Secondary | ICD-10-CM | POA: Diagnosis not present

## 2023-07-29 DIAGNOSIS — R0602 Shortness of breath: Secondary | ICD-10-CM | POA: Diagnosis not present

## 2023-07-29 DIAGNOSIS — Z95820 Peripheral vascular angioplasty status with implants and grafts: Secondary | ICD-10-CM | POA: Diagnosis not present

## 2023-07-29 DIAGNOSIS — N184 Chronic kidney disease, stage 4 (severe): Secondary | ICD-10-CM | POA: Diagnosis not present

## 2023-07-29 DIAGNOSIS — I739 Peripheral vascular disease, unspecified: Secondary | ICD-10-CM | POA: Diagnosis not present

## 2023-10-01 ENCOUNTER — Encounter: Payer: Self-pay | Admitting: Internal Medicine

## 2023-10-08 ENCOUNTER — Inpatient Hospital Stay: Payer: Medicare Other | Attending: Internal Medicine

## 2023-10-08 DIAGNOSIS — D631 Anemia in chronic kidney disease: Secondary | ICD-10-CM | POA: Insufficient documentation

## 2023-10-08 DIAGNOSIS — N184 Chronic kidney disease, stage 4 (severe): Secondary | ICD-10-CM | POA: Diagnosis present

## 2023-10-08 DIAGNOSIS — D472 Monoclonal gammopathy: Secondary | ICD-10-CM | POA: Insufficient documentation

## 2023-10-08 LAB — CMP (CANCER CENTER ONLY)
ALT: 12 U/L (ref 0–44)
AST: 20 U/L (ref 15–41)
Albumin: 3.9 g/dL (ref 3.5–5.0)
Alkaline Phosphatase: 67 U/L (ref 38–126)
Anion gap: 10 (ref 5–15)
BUN: 38 mg/dL — ABNORMAL HIGH (ref 8–23)
CO2: 21 mmol/L — ABNORMAL LOW (ref 22–32)
Calcium: 8.7 mg/dL — ABNORMAL LOW (ref 8.9–10.3)
Chloride: 104 mmol/L (ref 98–111)
Creatinine: 1.7 mg/dL — ABNORMAL HIGH (ref 0.44–1.00)
GFR, Estimated: 28 mL/min — ABNORMAL LOW (ref >60)
Glucose, Bld: 89 mg/dL (ref 70–99)
Potassium: 4.9 mmol/L (ref 3.5–5.1)
Sodium: 135 mmol/L (ref 135–145)
Total Bilirubin: 0.7 mg/dL (ref 0.0–1.2)
Total Protein: 7.3 g/dL (ref 6.5–8.1)

## 2023-10-08 LAB — CBC WITH DIFFERENTIAL (CANCER CENTER ONLY)
Abs Immature Granulocytes: 0.01 10*3/uL (ref 0.00–0.07)
Basophils Absolute: 0 10*3/uL (ref 0.0–0.1)
Basophils Relative: 1 %
Eosinophils Absolute: 0.2 10*3/uL (ref 0.0–0.5)
Eosinophils Relative: 5 %
HCT: 31.8 % — ABNORMAL LOW (ref 36.0–46.0)
Hemoglobin: 10.6 g/dL — ABNORMAL LOW (ref 12.0–15.0)
Immature Granulocytes: 0 %
Lymphocytes Relative: 37 %
Lymphs Abs: 1.8 10*3/uL (ref 0.7–4.0)
MCH: 32 pg (ref 26.0–34.0)
MCHC: 33.3 g/dL (ref 30.0–36.0)
MCV: 96.1 fL (ref 80.0–100.0)
Monocytes Absolute: 0.5 10*3/uL (ref 0.1–1.0)
Monocytes Relative: 10 %
Neutro Abs: 2.2 10*3/uL (ref 1.7–7.7)
Neutrophils Relative %: 47 %
Platelet Count: 134 10*3/uL — ABNORMAL LOW (ref 150–400)
RBC: 3.31 MIL/uL — ABNORMAL LOW (ref 3.87–5.11)
RDW: 14.3 % (ref 11.5–15.5)
WBC Count: 4.7 10*3/uL (ref 4.0–10.5)
nRBC: 0 % (ref 0.0–0.2)

## 2023-10-08 LAB — IRON AND TIBC
Iron: 90 ug/dL (ref 28–170)
Saturation Ratios: 29 % (ref 10.4–31.8)
TIBC: 307 ug/dL (ref 250–450)
UIBC: 217 ug/dL

## 2023-10-08 LAB — FERRITIN: Ferritin: 73 ng/mL (ref 11–307)

## 2023-10-08 LAB — LACTATE DEHYDROGENASE: LDH: 151 U/L (ref 98–192)

## 2023-10-09 LAB — KAPPA/LAMBDA LIGHT CHAINS
Kappa free light chain: 33.8 mg/L — ABNORMAL HIGH (ref 3.3–19.4)
Kappa, lambda light chain ratio: 0.09 — ABNORMAL LOW (ref 0.26–1.65)
Lambda free light chains: 365.3 mg/L — ABNORMAL HIGH (ref 5.7–26.3)

## 2023-10-13 LAB — MULTIPLE MYELOMA PANEL, SERUM
Albumin SerPl Elph-Mcnc: 3.7 g/dL (ref 2.9–4.4)
Albumin/Glob SerPl: 1.1 (ref 0.7–1.7)
Alpha 1: 0.2 g/dL (ref 0.0–0.4)
Alpha2 Glob SerPl Elph-Mcnc: 0.7 g/dL (ref 0.4–1.0)
B-Globulin SerPl Elph-Mcnc: 0.9 g/dL (ref 0.7–1.3)
Gamma Glob SerPl Elph-Mcnc: 1.5 g/dL (ref 0.4–1.8)
Globulin, Total: 3.4 g/dL (ref 2.2–3.9)
IgA: 98 mg/dL (ref 64–422)
IgG (Immunoglobin G), Serum: 1548 mg/dL (ref 586–1602)
IgM (Immunoglobulin M), Srm: 34 mg/dL (ref 26–217)
M Protein SerPl Elph-Mcnc: 1.1 g/dL — ABNORMAL HIGH
Total Protein ELP: 7.1 g/dL (ref 6.0–8.5)

## 2023-10-14 ENCOUNTER — Encounter: Payer: Self-pay | Admitting: Internal Medicine

## 2023-10-23 ENCOUNTER — Inpatient Hospital Stay: Payer: Medicare Other | Attending: Internal Medicine | Admitting: Internal Medicine

## 2023-10-23 ENCOUNTER — Encounter: Payer: Self-pay | Admitting: Internal Medicine

## 2023-10-23 ENCOUNTER — Inpatient Hospital Stay: Payer: Medicare Other

## 2023-10-23 VITALS — BP 143/73 | HR 64 | Temp 97.7°F | Ht 60.0 in | Wt 145.2 lb

## 2023-10-23 DIAGNOSIS — Z79899 Other long term (current) drug therapy: Secondary | ICD-10-CM | POA: Diagnosis not present

## 2023-10-23 DIAGNOSIS — D631 Anemia in chronic kidney disease: Secondary | ICD-10-CM | POA: Diagnosis not present

## 2023-10-23 DIAGNOSIS — C9002 Multiple myeloma in relapse: Secondary | ICD-10-CM

## 2023-10-23 DIAGNOSIS — N184 Chronic kidney disease, stage 4 (severe): Secondary | ICD-10-CM | POA: Diagnosis not present

## 2023-10-23 DIAGNOSIS — I2510 Atherosclerotic heart disease of native coronary artery without angina pectoris: Secondary | ICD-10-CM | POA: Insufficient documentation

## 2023-10-23 NOTE — Progress Notes (Signed)
 [facility] CONSULT NOTE  Patient Care Team: Steva Clotilda DEL, NP as PCP - General (Family Medicine) Claudene Lamarr Salm, NP (Inactive) as Nurse Practitioner (Hospice and Palliative Medicine) Florencio Cara BIRCH, MD as Consulting Physician (Cardiology) Steva Clotilda DEL, NP as Nurse Practitioner (Family Medicine) Rennie Cheyenne SAUNDERS, MD as Consulting Physician (Oncology)  CHIEF COMPLAINTS/PURPOSE OF CONSULTATION: anemia/multiple myeloma.   Oncology History   No history exists.   Ref Range & Units 1 mo ago Comments  Free Kappa Lt Chains, S 3.3 - 19.4 mg/L 42.7 High    Free Lambda Lt Chains, S 5.7 - 26.3 mg/L 374.7 High    Free Kappa/Lambda Ratio 0.26 - 1.65 0.11 Low     HISTORY OF PRESENTING ILLNESS: Patient ambulating-cane/with assistance.  Accompanied by daughter.   Cheyenne [name] 89 y.o.  female patient with remote history of breast cancer [1995] with multiple medical problems including A.fib; anemia/CKD; CHF with with multiple myeloma on surveillance is here for a follow up.  Pt states sometimes at night her heart fills like it is racing.  No chest pain.  Resolved.  Denies anxiety  Patient otherwise denies any new onset of tingling or numbness joint pains or back pain.  No weight loss.  Complains of mild to moderate fatigue.  Still able to ambulate with a cane without any falls.   Review of Systems  Constitutional:  Positive for malaise/fatigue. Negative for chills, diaphoresis, fever and weight loss.  HENT:  Negative for nosebleeds and sore throat.   Eyes:  Negative for double vision.  Respiratory:  Negative for hemoptysis, sputum production, shortness of breath and wheezing.   Cardiovascular:  Negative for chest pain, palpitations, orthopnea and leg swelling.  Gastrointestinal:  Negative for abdominal pain, blood in stool, constipation, diarrhea, heartburn, melena, nausea and vomiting.  Genitourinary:  Negative for dysuria, frequency and urgency.   Musculoskeletal:  Positive for back pain and joint pain.  Skin: Negative.  Negative for itching and rash.  Neurological:  Negative for dizziness, tingling, focal weakness, weakness and headaches.  Endo/Heme/Allergies:  Does not bruise/bleed easily.  Psychiatric/Behavioral:  Negative for depression. The patient is not nervous/anxious and does not have insomnia.     MEDICAL HISTORY:  Past Medical History:  Diagnosis Date   A-fib [facility])    a.) CHA2DS2VASc = 6 (age x 2, sex, HFrEF, HTN, NSTEMI);  b.) rate/rhythm maintained on oral metoprolol  succinate; chronically anticoagulated with apixaban  + clopidogrel    Allergic rhinitis    Anemia    Anxiety    Arthritis    Asthma    Breast cancer, right (HCC) 1995   a.) s/p surgical resection (mastectomy) + chemotherapy + 5 years endocrine therapy (tamoxifen)   CAD (coronary artery disease)    a.) LHC 09/15/2021: 90% mLAD, 70% D1, 99% pLCx, 95% mRCA.   Carpal tunnel syndrome    Cervical radiculopathy due to degenerative joint disease of spine    Chronic kidney disease (CKD), stage IV (severe) (HCC)    Chronic use of opiate for therapeutic purpose    a.) has naloxone Rx in place   Depression    Diverticulitis    Diverticulosis    DOE (dyspnea on exertion)    Followed by palliative care service    GERD (gastroesophageal reflux disease)    H. pylori infection 06/2005   H/O bilateral cataract extraction 12/2005   HFrEF (heart failure with reduced ejection fraction) (HCC) 09/14/2021   a.) newly Dx'd. EF 35%, glob HK, sep/inf/post AK, mild  LAE, triv AR/PR, mild MR/TR, GLS -9.1%, G2DD; b.) TTE 02/14/2022: EF 60-65%, mild-mod MR, G1DD   History of spinal fracture 1990   a.) T7 and T12 fractures - traumatic sequela of a mechanical fall   HLD (hyperlipidemia)    HTN (hypertension)    Impaired glucose tolerance    LBBB (left bundle branch block)    Long term current use of anticoagulant    a.) dose reduced apixaban    Long term current use of  antithrombotics/antiplatelets    a.) clopidogrel    Lumbago    Lumbar radiculopathy    Macular degeneration    NSTEMI (non-ST elevated myocardial infarction) (HCC) 09/13/2021   a.) LHC/PCI 09/15/2021: 90% mLAD (plans for staged PCI), 70% D1, 99% pLCx (2.75 x 30 mm Resolute Onyx Frontier DES), 95% mRCA (3.5 x 15 mm Resolute Onyx Frontier DES); b.) staged PCI 09/19/2021 - PTCA/DES x1 (unknown type) to mLAD   Osteoporosis    Ovarian cyst    Paresthesia of both hands    Peripheral edema    PVD (peripheral vascular disease) (HCC)    Vitamin D deficiency     SURGICAL HISTORY: Past Surgical History:  Procedure Laterality Date   ABDOMINAL HYSTERECTOMY  1972   CARPAL TUNNEL RELEASE Right 08/10/2019   CATARACT EXTRACTION Bilateral 2007   COLONOSCOPY     CORONARY ANGIOPLASTY WITH STENT PLACEMENT Left 09/15/2021   Procedure: CORONARY ANGIOPLASTY WITH STENT PLACEMENT; Location: [facility]; Surgeon: Paticia Sharp, MD   CORONARY ANGIOPLASTY WITH STENT PLACEMENT Left 09/19/2021   Procedure: CORONARY ANGIOPLASTY WITH STENT PLACEMENT; Location: [facility]; Surgeon: Lynnie Muta, MD   IR BONE MARROW BIOPSY & ASPIRATION  05/13/2023   LUMBAR LAMINECTOMY/DECOMPRESSION MICRODISCECTOMY Left 07/25/2022   Procedure: LEFT L4-5 LAMINOFORAMINOTOMY;  Surgeon: Clois Fret, MD;  Location: [facility];  Service: Neurosurgery;  Laterality: Left;   MASTECTOMY Right 1999   SIGMOIDOSCOPY  2004   UPPER GASTROINTESTINAL ENDOSCOPY  2006    SOCIAL HISTORY: Social History   Socioeconomic History   Marital status: Widowed    Spouse name: Not on file   Number of children: Not on file   Years of education: Not on file   Highest education level: Not on file  Occupational History   Not on file  Tobacco Use   Smoking status: Never   Smokeless tobacco: Never  Vaping Use   Vaping status: Never Used  Substance and Sexual Activity   Alcohol  use: Never   Drug use: Never   Sexual activity: Not on file  Other Topics Concern    Not on file  Social History Narrative   Lives alone   Social Drivers of Health   Financial Resource Strain: Low Risk  (10/29/2017)   Received from [facility], [facility]   Overall Financial Resource Strain (CARDIA)    Difficulty of Paying Living Expenses: Not very hard  Food Insecurity: No Food Insecurity (05/02/2023)   Hunger Vital Sign    Worried About Running Out of Food in the Last Year: Never true    Ran Out of Food in the Last Year: Never true  Transportation Needs: No Transportation Needs (05/02/2023)   PRAPARE - Administrator, Civil Service (Medical): No    Lack of Transportation (Non-Medical): No  Physical Activity: Not on file  Stress: Stress Concern Present (10/29/2017)   Received from [facility], [facility]   [employer] of Occupational Health - Occupational Stress Questionnaire    Feeling of  Stress : To some extent  Social Connections: Moderately Integrated (10/29/2017)   Received from [facility], [facility]   Social Connection and Isolation Panel [NHANES]    Frequency of Communication with Friends and Family: More than three times a week    Frequency of Social Gatherings with Friends and Family: More than three times a week    Attends Religious Services: More than 4 times per year    Active Member of [employer] or Organizations: Yes    Attends Banker Meetings: More than 4 times per year    Marital Status: Widowed  Intimate Partner Violence: Not At Risk (05/02/2023)   Humiliation, Afraid, Rape, and Kick questionnaire    Fear of Current or Ex-Partner: No    Emotionally Abused: No    Physically Abused: No    Sexually Abused: No    FAMILY HISTORY: Family History  Problem Relation Age of Onset   Multiple myeloma Daughter     ALLERGIES:  is allergic to alendronate, levofloxacin, codeine, and propoxyphene.  MEDICATIONS:   Current Outpatient Medications  Medication Sig Dispense Refill   acetaminophen  (TYLENOL ) 500 MG tablet Take 1,000 mg by mouth every 6 (six) hours as needed.     Cholecalciferol (VITAMIN D3 PO) Take by mouth daily.     clopidogrel  (PLAVIX ) 75 MG tablet Take 75 mg by mouth daily.     cyanocobalamin  1000 MCG tablet TAKE 1 TABLET EVERY DAY FOR VITAMIN B12 DEFICIENCY     fexofenadine (ALLEGRA) 180 MG tablet Take 180 mg by mouth daily.     fluticasone (FLONASE) 50 MCG/ACT nasal spray Place 1 spray into both nostrils daily.     ipratropium (ATROVENT ) 0.06 % nasal spray Place 2 sprays into the nose 3 (three) times daily.     ipratropium-albuterol  (DUONEB) 0.5-2.5 (3) MG/3ML SOLN Inhale 3 mLs into the lungs every 4 (four) hours as needed.     lisinopril  (ZESTRIL ) 2.5 MG tablet Take 2.5 mg by mouth daily.     metoprolol  succinate (TOPROL -XL) 25 MG 24 hr tablet Take 25 mg by mouth daily.     Multiple Vitamins-Minerals (PRESERVISION AREDS 2) CAPS Take 1 capsule by mouth daily.     omeprazole (PRILOSEC) 40 MG capsule Take 40 mg by mouth daily.     torsemide  (DEMADEX ) 20 MG tablet Take 20 mg by mouth daily.     naloxone (NARCAN) nasal spray 4 mg/0.1 mL SMARTSIG:Both Nares (Patient not taking: Reported on 05/02/2023)     nitroGLYCERIN  (NITROSTAT ) 0.4 MG SL tablet  (Patient not taking: Reported on 07/09/2023)     No current facility-administered medications for this visit.    PHYSICAL EXAMINATION:  Vitals:   10/23/23 1034  BP: (!) 143/73  Pulse: 64  Temp: 97.7 F (36.5 C)  SpO2: (!) 64%    Filed Weights   10/23/23 1034  Weight: 145 lb 3.2 oz (65.9 kg)     Physical Exam Vitals and nursing note reviewed.  HENT:     Head: Normocephalic and atraumatic.     Mouth/Throat:     Pharynx: Oropharynx is clear.  Eyes:     Extraocular Movements: Extraocular movements intact.     Pupils: Pupils are equal, round, and reactive to light.  Cardiovascular:     Rate and Rhythm: Normal rate and regular  rhythm.  Pulmonary:     Comments: Decreased breath sounds bilaterally.  Abdominal:     Palpations: Abdomen is soft.  Musculoskeletal:  General: Normal range of motion.     Cervical back: Normal range of motion.  Skin:    General: Skin is warm.  Neurological:     General: No focal deficit present.     Mental Status: She is alert and oriented to person, place, and time.  Psychiatric:        Behavior: Behavior normal.        Judgment: Judgment normal.     LABORATORY DATA:  I have reviewed the data as listed Lab Results  Component Value Date   WBC 4.7 10/08/2023   HGB 10.6 (L) 10/08/2023   HCT 31.8 (L) 10/08/2023   MCV 96.1 10/08/2023   PLT 134 (L) 10/08/2023   Recent Labs    05/02/23 1136 07/09/23 1244 10/08/23 1037  NA 137 140 135  K 5.1 5.5* 4.9  CL 108 109 104  CO2 22 25 21*  GLUCOSE 89 91 89  BUN 35* 50* 38*  CREATININE 1.88* 1.69* 1.70*  CALCIUM 8.8* 8.8* 8.7*  GFRNONAA 25* 28* 28*  PROT 7.5 7.2 7.3  ALBUMIN 4.1 4.0 3.9  AST 20 19 20   ALT 12 11 12   ALKPHOS 66 64 67  BILITOT 0.6 0.4 0.7    RADIOGRAPHIC STUDIES: I have personally reviewed the radiological images as listed and agreed with the findings in the report. No results found.   Multiple myeloma in relapse [facility]) #  STAGE III- JULY 2024- K/L= 42/247; Anemia [hb 10.3]; platelets- 127. No Hypercalcemia. GFR 30s. 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  Bone marrow- Scant iron. See below genetics: FISH: 13 q; 1q gain. # OCT 2024- PET scan: No findings suspicious for active myeloma.   # I again discussed with the patient and daughter- given her renal dysfunction and also anemia in context of bone marrow biopsy multiple myeloma will be considered active-however  as patient is not overtly symptomatic I think is reasonable especially in the-the context of age surveillance is reasonable.    # Again reviewed that with the patient that the plan is to proceed with treatment if patient noted to have worsening renal function or anemia.  If and when needed patient be considered for-low-dose Dex; Revlimid/ PO PI [to cut down travel]  # OCT 2024- Bone marrow [scant Iron]- Given the ongoing anemia/chronic kidney disease - continue gentle iron  1 pill a day.  HOLD venofer for now.   # CKD- [2021-2022]- III-IV [Dr.Singh]-question related to multiple myeloma versus others-overall stable since May 2024.  # History of CHF- clinically stable.   # Vaccination: flu shot [2024]; recommend pneumonia shot [as per PCP]  # DISPOSITION: # No venofer- # follow up in 3 months- MD;2 weeks prior labs- cbc/cmp; iron studies;ferritin; MM panel; K/l light chains; LDH' possible venofer--  Dr.B   Cc; PCP: Clotilda Nielsen   Above plan of care was discussed with patient/family in detail.  My contact information was given to the patient/family.    Cheyenne JONELLE Joe, MD 10/23/2023 11:21 AM

## 2023-10-23 NOTE — Assessment & Plan Note (Addendum)
#    STAGE III- JULY 2024- K/L= 42/247; Anemia [hb 10.3]; platelets- 127. No Hypercalcemia. GFR 30s. 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  Bone marrow- Scant iron. See below genetics: FISH: 13 q; 1q gain. # OCT 2024- PET scan: No findings suspicious for active myeloma.   # I again discussed with the patient and daughter- given her renal dysfunction and also anemia in context of bone marrow biopsy multiple myeloma will be considered active-however as patient is not overtly symptomatic I think is reasonable especially in the-the context of age surveillance is reasonable.    # Again reviewed that with the patient that the plan is to proceed with treatment if patient noted to have worsening renal function or anemia.  If and when needed patient be considered for-low-dose Dex; Revlimid/ PO PI [to cut down travel]  # OCT 2024- Bone marrow [scant Iron]- Given the ongoing anemia/chronic kidney disease - continue gentle iron  1 pill a day.  HOLD venofer for now.   # CKD- [2021-2022]- III-IV [Dr.Singh]-question related to multiple myeloma versus others-overall stable since May 2024.  # History of CHF- clinically stable.   # Vaccination: flu shot [2024]; recommend pneumonia shot [as per PCP]  # DISPOSITION: # No venofer- # follow up in 3 months- MD;2 weeks prior labs- cbc/cmp; iron studies;ferritin; MM panel; K/l light chains; LDH' possible venofer--  Dr.B   Cc; PCP: Clotilda Nielsen

## 2023-10-23 NOTE — Progress Notes (Signed)
Pt states sometimes at night her heart fills like it is racing.

## 2023-10-23 NOTE — Addendum Note (Signed)
Addended by: Darrold Span A on: 10/23/2023 11:29 AM   Modules accepted: Orders

## 2024-01-06 ENCOUNTER — Inpatient Hospital Stay: Payer: Medicare Other | Attending: Internal Medicine

## 2024-01-06 DIAGNOSIS — C9002 Multiple myeloma in relapse: Secondary | ICD-10-CM | POA: Insufficient documentation

## 2024-01-06 LAB — CBC WITH DIFFERENTIAL (CANCER CENTER ONLY)
Abs Immature Granulocytes: 0.01 10*3/uL (ref 0.00–0.07)
Basophils Absolute: 0 10*3/uL (ref 0.0–0.1)
Basophils Relative: 1 %
Eosinophils Absolute: 0.3 10*3/uL (ref 0.0–0.5)
Eosinophils Relative: 6 %
HCT: 31.4 % — ABNORMAL LOW (ref 36.0–46.0)
Hemoglobin: 10.3 g/dL — ABNORMAL LOW (ref 12.0–15.0)
Immature Granulocytes: 0 %
Lymphocytes Relative: 38 %
Lymphs Abs: 1.8 10*3/uL (ref 0.7–4.0)
MCH: 32 pg (ref 26.0–34.0)
MCHC: 32.8 g/dL (ref 30.0–36.0)
MCV: 97.5 fL (ref 80.0–100.0)
Monocytes Absolute: 0.5 10*3/uL (ref 0.1–1.0)
Monocytes Relative: 10 %
Neutro Abs: 2.2 10*3/uL (ref 1.7–7.7)
Neutrophils Relative %: 45 %
Platelet Count: 126 10*3/uL — ABNORMAL LOW (ref 150–400)
RBC: 3.22 MIL/uL — ABNORMAL LOW (ref 3.87–5.11)
RDW: 14 % (ref 11.5–15.5)
WBC Count: 4.8 10*3/uL (ref 4.0–10.5)
nRBC: 0 % (ref 0.0–0.2)

## 2024-01-06 LAB — CMP (CANCER CENTER ONLY)
ALT: 11 U/L (ref 0–44)
AST: 18 U/L (ref 15–41)
Albumin: 3.9 g/dL (ref 3.5–5.0)
Alkaline Phosphatase: 63 U/L (ref 38–126)
Anion gap: 9 (ref 5–15)
BUN: 60 mg/dL — ABNORMAL HIGH (ref 8–23)
CO2: 21 mmol/L — ABNORMAL LOW (ref 22–32)
Calcium: 8.5 mg/dL — ABNORMAL LOW (ref 8.9–10.3)
Chloride: 107 mmol/L (ref 98–111)
Creatinine: 1.91 mg/dL — ABNORMAL HIGH (ref 0.44–1.00)
GFR, Estimated: 24 mL/min — ABNORMAL LOW (ref >60)
Glucose, Bld: 94 mg/dL (ref 70–99)
Potassium: 5.1 mmol/L (ref 3.5–5.1)
Sodium: 137 mmol/L (ref 135–145)
Total Bilirubin: 0.6 mg/dL (ref 0.0–1.2)
Total Protein: 7 g/dL (ref 6.5–8.1)

## 2024-01-06 LAB — IRON AND TIBC
Iron: 80 ug/dL (ref 28–170)
Saturation Ratios: 27 % (ref 10.4–31.8)
TIBC: 294 ug/dL (ref 250–450)
UIBC: 214 ug/dL

## 2024-01-06 LAB — FERRITIN: Ferritin: 102 ng/mL (ref 11–307)

## 2024-01-06 LAB — LACTATE DEHYDROGENASE: LDH: 153 U/L (ref 98–192)

## 2024-01-07 LAB — KAPPA/LAMBDA LIGHT CHAINS
Kappa free light chain: 33.5 mg/L — ABNORMAL HIGH (ref 3.3–19.4)
Kappa, lambda light chain ratio: 0.09 — ABNORMAL LOW (ref 0.26–1.65)
Lambda free light chains: 391.3 mg/L — ABNORMAL HIGH (ref 5.7–26.3)

## 2024-01-07 LAB — MULTIPLE MYELOMA PANEL, SERUM
Albumin SerPl Elph-Mcnc: 3.7 g/dL (ref 2.9–4.4)
Albumin/Glob SerPl: 1.2 (ref 0.7–1.7)
Alpha 1: 0.2 g/dL (ref 0.0–0.4)
Alpha2 Glob SerPl Elph-Mcnc: 0.7 g/dL (ref 0.4–1.0)
B-Globulin SerPl Elph-Mcnc: 0.8 g/dL (ref 0.7–1.3)
Gamma Glob SerPl Elph-Mcnc: 1.4 g/dL (ref 0.4–1.8)
Globulin, Total: 3.1 g/dL (ref 2.2–3.9)
IgA: 88 mg/dL (ref 64–422)
IgG (Immunoglobin G), Serum: 1674 mg/dL — ABNORMAL HIGH (ref 586–1602)
IgM (Immunoglobulin M), Srm: 38 mg/dL (ref 26–217)
M Protein SerPl Elph-Mcnc: 0.8 g/dL — ABNORMAL HIGH
Total Protein ELP: 6.8 g/dL (ref 6.0–8.5)

## 2024-01-16 ENCOUNTER — Encounter: Payer: Self-pay | Admitting: Internal Medicine

## 2024-01-20 ENCOUNTER — Inpatient Hospital Stay: Payer: Medicare Other

## 2024-01-20 ENCOUNTER — Inpatient Hospital Stay: Payer: Medicare Other | Attending: Internal Medicine | Admitting: Internal Medicine

## 2024-01-20 ENCOUNTER — Encounter: Payer: Self-pay | Admitting: Internal Medicine

## 2024-01-20 DIAGNOSIS — C9002 Multiple myeloma in relapse: Secondary | ICD-10-CM

## 2024-01-20 DIAGNOSIS — Z79899 Other long term (current) drug therapy: Secondary | ICD-10-CM | POA: Insufficient documentation

## 2024-01-20 DIAGNOSIS — N184 Chronic kidney disease, stage 4 (severe): Secondary | ICD-10-CM | POA: Insufficient documentation

## 2024-01-20 DIAGNOSIS — D631 Anemia in chronic kidney disease: Secondary | ICD-10-CM | POA: Diagnosis not present

## 2024-01-20 DIAGNOSIS — I509 Heart failure, unspecified: Secondary | ICD-10-CM | POA: Diagnosis not present

## 2024-01-20 NOTE — Assessment & Plan Note (Addendum)
#    STAGE III- JULY 2024- K/L= 42/247; Anemia [hb 10.3]; platelets- 127. No Hypercalcemia. GFR 30s. 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  Bone marrow- Scant iron. See below genetics: FISH: 13 q; 1q gain. # OCT 2024- PET scan: No findings suspicious for active myeloma.   # MAY 2025- MM/K-L- I again discussed with the patient and family given her renal dysfunction and also anemia in context of bone marrow biopsy multiple myeloma will be considered active-however as patient is not overtly symptomatic I think is reasonable especially in the-the context of age surveillance is reasonable.    # Again reviewed that with the patient that the plan is to proceed with treatment if patient noted to have worsening renal function or anemia.  If and when needed patient be considered for-low-dose Dex; Revlimid/ PO PI [to cut down travel]  # OCT 2024- Bone marrow [scant Iron]- Given the ongoing anemia/chronic kidney disease - continue gentle iron  1 pill a day.  HOLD venofer for now- stable. .   # CKD- [2021-2022]- III-IV [Dr.Singh]-question related to multiple myeloma versus others-overall stable since May 2024.  # History of CHF- clinically stable.   # Vaccination: flu shot [2024]; recommend pneumonia shot [as per PCP]  I spoke at length with the patient's family, Judi- regarding the patient's clinical status/plan of care.  Patient wants follow up in 6 M, and not 3 months- plan updated.    # DISPOSITION: # No venofer- # follow up in 6 months- MD; 2 weeks prior labs- cbc/cmp; iron studies;ferritin; MM panel; K/l light chains; LDH' possible venofer--  Dr.B   Cc; PCP: Yevette Hem

## 2024-01-20 NOTE — Progress Notes (Signed)
 [facility] CONSULT NOTE  Patient Care Team: Efraim Grange, NP as PCP - General (Family Medicine) Talbot Factor, NP (Inactive) as Nurse Practitioner (Hospice and Palliative Medicine) Antonette Batters, MD as Consulting Physician (Cardiology) Efraim Grange, NP as Nurse Practitioner (Family Medicine) Gwyn Leos, MD as Consulting Physician (Oncology)  CHIEF COMPLAINTS/PURPOSE OF CONSULTATION: anemia/multiple myeloma.   Oncology History   No history exists.   Ref Range & Units 1 mo ago Comments  Free Kappa Lt Chains, S 3.3 - 19.4 mg/L 42.7 High    Free Lambda Lt Chains, S 5.7 - 26.3 mg/L 374.7 High    Free Kappa/Lambda Ratio 0.26 - 1.65 0.11 Low     HISTORY OF PRESENTING ILLNESS: Patient ambulating-cane/with assistance.  Accompanied by daughter.   Cheyenne [name] 89 y.o.  female patient with remote history of breast cancer [1995] with multiple medical problems including A.fib; anemia/CKD; CHF with with multiple myeloma on surveillance is here for a follow up.  Patient is in for follow up, reports sore body and gets fatigued easily. Pt has CHF and wearing compression stockings. Has swelling in legs bilateral.   Patient otherwise denies any new onset of tingling or numbness joint pains or back pain.  No weight loss.  Complains of mild to moderate fatigue.  Still able to ambulate with a cane without any falls.   Review of Systems  Constitutional:  Positive for malaise/fatigue. Negative for chills, diaphoresis, fever and weight loss.  HENT:  Negative for nosebleeds and sore throat.   Eyes:  Negative for double vision.  Respiratory:  Negative for hemoptysis, sputum production, shortness of breath and wheezing.   Cardiovascular:  Negative for chest pain, palpitations, orthopnea and leg swelling.  Gastrointestinal:  Negative for abdominal pain, blood in stool, constipation, diarrhea, heartburn, melena, nausea and vomiting.  Genitourinary:   Negative for dysuria, frequency and urgency.  Musculoskeletal:  Positive for back pain and joint pain.  Skin: Negative.  Negative for itching and rash.  Neurological:  Negative for dizziness, tingling, focal weakness, weakness and headaches.  Endo/Heme/Allergies:  Does not bruise/bleed easily.  Psychiatric/Behavioral:  Negative for depression. The patient is not nervous/anxious and does not have insomnia.     MEDICAL HISTORY:  Past Medical History:  Diagnosis Date   A-fib [facility])    a.) CHA2DS2VASc = 6 (age x 2, sex, HFrEF, HTN, NSTEMI);  b.) rate/rhythm maintained on oral metoprolol  succinate; chronically anticoagulated with apixaban  + clopidogrel    Allergic rhinitis    Anemia    Anxiety    Arthritis    Asthma    Breast cancer, right (HCC) 1995   a.) s/p surgical resection (mastectomy) + chemotherapy + 5 years endocrine therapy (tamoxifen)   CAD (coronary artery disease)    a.) LHC 09/15/2021: 90% mLAD, 70% D1, 99% pLCx, 95% mRCA.   Carpal tunnel syndrome    Cervical radiculopathy due to degenerative joint disease of spine    Chronic kidney disease (CKD), stage IV (severe) (HCC)    Chronic use of opiate for therapeutic purpose    a.) has naloxone Rx in place   Depression    Diverticulitis    Diverticulosis    DOE (dyspnea on exertion)    Followed by palliative care service    GERD (gastroesophageal reflux disease)    H. pylori infection 06/2005   H/O bilateral cataract extraction 12/2005   HFrEF (heart failure with reduced ejection fraction) (HCC) 09/14/2021   a.) newly Dx'd. EF 35%,  glob HK, sep/inf/post AK, mild LAE, triv AR/PR, mild MR/TR, GLS -9.1%, G2DD; b.) TTE 02/14/2022: EF 60-65%, mild-mod MR, G1DD   History of spinal fracture 1990   a.) T7 and T12 fractures - traumatic sequela of a mechanical fall   HLD (hyperlipidemia)    HTN (hypertension)    Impaired glucose tolerance    LBBB (left bundle branch block)    Long term current use of anticoagulant    a.) dose  reduced apixaban    Long term current use of antithrombotics/antiplatelets    a.) clopidogrel    Lumbago    Lumbar radiculopathy    Macular degeneration    NSTEMI (non-ST elevated myocardial infarction) (HCC) 09/13/2021   a.) LHC/PCI 09/15/2021: 90% mLAD (plans for staged PCI), 70% D1, 99% pLCx (2.75 x 30 mm Resolute Onyx Frontier DES), 95% mRCA (3.5 x 15 mm Resolute Onyx Frontier DES); b.) staged PCI 09/19/2021 - PTCA/DES x1 (unknown type) to mLAD   Osteoporosis    Ovarian cyst    Paresthesia of both hands    Peripheral edema    PVD (peripheral vascular disease) (HCC)    Vitamin D deficiency     SURGICAL HISTORY: Past Surgical History:  Procedure Laterality Date   ABDOMINAL HYSTERECTOMY  1972   CARPAL TUNNEL RELEASE Right 08/10/2019   CATARACT EXTRACTION Bilateral 2007   COLONOSCOPY     CORONARY ANGIOPLASTY WITH STENT PLACEMENT Left 09/15/2021   Procedure: CORONARY ANGIOPLASTY WITH STENT PLACEMENT; Location: [facility]; Surgeon: Patti Border, MD   CORONARY ANGIOPLASTY WITH STENT PLACEMENT Left 09/19/2021   Procedure: CORONARY ANGIOPLASTY WITH STENT PLACEMENT; Location: [facility]; Surgeon: Jacque Math, MD   IR BONE MARROW BIOPSY & ASPIRATION  05/13/2023   LUMBAR LAMINECTOMY/DECOMPRESSION MICRODISCECTOMY Left 07/25/2022   Procedure: LEFT L4-5 LAMINOFORAMINOTOMY;  Surgeon: Jodeen Munch, MD;  Location: [facility];  Service: Neurosurgery;  Laterality: Left;   MASTECTOMY Right 1999   SIGMOIDOSCOPY  2004   UPPER GASTROINTESTINAL ENDOSCOPY  2006    SOCIAL HISTORY: Social History   Socioeconomic History   Marital status: Widowed    Spouse name: Not on file   Number of children: Not on file   Years of education: Not on file   Highest education level: Not on file  Occupational History   Not on file  Tobacco Use   Smoking status: Never   Smokeless tobacco: Never  Vaping Use   Vaping status: Never Used  Substance and Sexual Activity   Alcohol  use: Never   Drug use: Never   Sexual  activity: Not on file  Other Topics Concern   Not on file  Social History Narrative   Lives alone   Social Drivers of Health   Financial Resource Strain: Low Risk  (12/03/2023)   Received from [facility]   Overall Financial Resource Strain (CARDIA)    Difficulty of Paying Living Expenses: Not hard at all  Food Insecurity: No Food Insecurity (12/03/2023)   [facility]   Hunger Vital Sign    Worried About Running Out of Food in the Last Year: Never true    Ran Out of Food in the Last Year: Never true  Transportation Needs: No Transportation Needs (12/03/2023)   Received from [facility] - Transportation    In the past 12 months, has lack of transportation kept you from medical appointments or from getting medications?: No    Lack of Transportation (Non-Medical): No  Physical Activity: Not on file  Stress:  Stress Concern Present (10/29/2017)   Received from [facility], [facility]   [employer] of Occupational Health - Occupational Stress Questionnaire    Feeling of Stress : To some extent  Social Connections: Moderately Integrated (10/29/2017)   Received from [facility], [facility]   Social Connection and Isolation Panel [NHANES]    Frequency of Communication with Friends and Family: More than three times a week    Frequency of Social Gatherings with Friends and Family: More than three times a week    Attends Religious Services: More than 4 times per year    Active Member of [employer] or Organizations: Yes    Attends Banker Meetings: More than 4 times per year    Marital Status: Widowed  Intimate Partner Violence: Not At Risk (05/02/2023)   Humiliation, Afraid, Rape, and Kick questionnaire    Fear of Current or Ex-Partner: No    Emotionally Abused: No    Physically Abused: No    Sexually Abused: No    FAMILY  HISTORY: Family History  Problem Relation Age of Onset   Multiple myeloma Daughter     ALLERGIES:  is allergic to alendronate, levofloxacin, codeine, and propoxyphene.  MEDICATIONS:  Current Outpatient Medications  Medication Sig Dispense Refill   acetaminophen  (TYLENOL ) 500 MG tablet Take 1,000 mg by mouth every 6 (six) hours as needed.     Cholecalciferol (VITAMIN D3 PO) Take by mouth daily.     clopidogrel  (PLAVIX ) 75 MG tablet Take 75 mg by mouth daily.     cyanocobalamin 1000 MCG tablet TAKE 1 TABLET EVERY DAY FOR VITAMIN B12 DEFICIENCY     fexofenadine (ALLEGRA) 180 MG tablet Take 180 mg by mouth daily.     fluticasone (FLONASE) 50 MCG/ACT nasal spray Place 1 spray into both nostrils daily.     ipratropium (ATROVENT) 0.06 % nasal spray Place 2 sprays into the nose 3 (three) times daily.     lidocaine  (LIDODERM ) 5 % Place 1-2 patches onto the skin.     lisinopril  (ZESTRIL ) 2.5 MG tablet Take 2.5 mg by mouth daily.     metoprolol  succinate (TOPROL -XL) 25 MG 24 hr tablet Take 25 mg by mouth daily.     Multiple Vitamins-Minerals (PRESERVISION AREDS 2) CAPS Take 1 capsule by mouth daily.     omeprazole (PRILOSEC) 40 MG capsule Take 40 mg by mouth daily.     pregabalin  (LYRICA ) 150 MG capsule Take 150 mg by mouth.     torsemide  (DEMADEX ) 20 MG tablet Take 20 mg by mouth daily.     naloxone (NARCAN) nasal spray 4 mg/0.1 mL SMARTSIG:Both Nares (Patient not taking: Reported on 05/02/2023)     nitroGLYCERIN  (NITROSTAT ) 0.4 MG SL tablet  (Patient not taking: Reported on 07/09/2023)     No current facility-administered medications for this visit.    PHYSICAL EXAMINATION:  Vitals:   01/20/24 1052  BP: (!) 138/59  Pulse: (!) 58  Resp: 16  Temp: (!) 96 F (35.6 C)  SpO2: 98%    Filed Weights   01/20/24 1052  Weight: 142 lb (64.4 kg)     Physical Exam Vitals and nursing note reviewed.  HENT:     Head: Normocephalic and atraumatic.     Mouth/Throat:     Pharynx: Oropharynx  is clear.  Eyes:     Extraocular Movements: Extraocular movements intact.     Pupils: Pupils are equal, round, and reactive to light.  Cardiovascular:     Rate and Rhythm: Normal rate and regular rhythm.  Pulmonary:     Comments: Decreased breath sounds bilaterally.  Abdominal:     Palpations: Abdomen is soft.  Musculoskeletal:        General: Normal range of motion.     Cervical back: Normal range of motion.  Skin:    General: Skin is warm.  Neurological:     General: No focal deficit present.     Mental Status: She is alert and oriented to person, place, and time.  Psychiatric:        Behavior: Behavior normal.        Judgment: Judgment normal.     LABORATORY DATA:  I have reviewed the data as listed Lab Results  Component Value Date   WBC 4.8 01/06/2024   HGB 10.3 (L) 01/06/2024   HCT 31.4 (L) 01/06/2024   MCV 97.5 01/06/2024   PLT 126 (L) 01/06/2024   Recent Labs    07/09/23 1244 10/08/23 1037 01/06/24 1100  NA 140 135 137  K 5.5* 4.9 5.1  CL 109 104 107  CO2 25 21* 21*  GLUCOSE 91 89 94  BUN 50* 38* 60*  CREATININE 1.69* 1.70* 1.91*  CALCIUM 8.8* 8.7* 8.5*  GFRNONAA 28* 28* 24*  PROT 7.2 7.3 7.0  ALBUMIN 4.0 3.9 3.9  AST 19 20 18   ALT 11 12 11   ALKPHOS 64 67 63  BILITOT 0.4 0.7 0.6    RADIOGRAPHIC STUDIES: I have personally reviewed the radiological images as listed and agreed with the findings in the report. No results found.   Multiple myeloma in relapse [facility]) #  STAGE III- JULY 2024- K/L= 42/247; Anemia [hb 10.3]; platelets- 127. No Hypercalcemia. GFR 30s. 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  Bone marrow- Scant iron. See below genetics: FISH: 13 q; 1q gain. # OCT 2024- PET scan: No findings suspicious  for active myeloma.   # MAY 2025- MM/K-L- I again discussed with the patient and family given her renal dysfunction and also anemia in context of bone marrow biopsy multiple myeloma will be considered active-however as patient is not overtly symptomatic I think is reasonable especially in the-the context of age surveillance is reasonable.    # Again reviewed that with the patient that the plan is to proceed with treatment if patient noted to have worsening renal function or anemia.  If and when needed patient be considered for-low-dose Dex; Revlimid/ PO PI [to cut down travel]  # OCT 2024- Bone marrow [scant Iron]- Given the ongoing anemia/chronic kidney disease - continue gentle iron  1 pill a day.  HOLD venofer for now- stable. .   # CKD- [2021-2022]- III-IV [Dr.Singh]-question related to multiple myeloma versus others-overall stable since May 2024.  # History of CHF- clinically stable.   # Vaccination: flu shot [2024]; recommend pneumonia shot [as per PCP]  # DISPOSITION: # No venofer- # follow up in 3 months- MD; 2 weeks prior labs- cbc/cmp; iron studies;ferritin; MM panel; K/l light chains; [facility]' possible venofer--  Dr.B   Cc; PCP: Yevette Hem    Above plan of care was discussed with patient/family in detail.  My contact information was given to the patient/family.    Gwyn Leos, MD 01/20/2024 11:16 AM

## 2024-01-20 NOTE — Progress Notes (Signed)
 Pt in for follow up, reports sore body and gets fatigued easily. Pt has CHF and wearing compression stockings. Has swelling in legs bilaterally.

## 2024-03-02 ENCOUNTER — Other Ambulatory Visit: Payer: Self-pay

## 2024-03-02 ENCOUNTER — Encounter: Payer: Self-pay | Admitting: Internal Medicine

## 2024-03-02 DIAGNOSIS — R799 Abnormal finding of blood chemistry, unspecified: Secondary | ICD-10-CM | POA: Insufficient documentation

## 2024-03-02 DIAGNOSIS — Z5321 Procedure and treatment not carried out due to patient leaving prior to being seen by health care provider: Secondary | ICD-10-CM | POA: Diagnosis not present

## 2024-03-02 LAB — BASIC METABOLIC PANEL WITH GFR
Anion gap: 8 (ref 5–15)
BUN: 46 mg/dL — ABNORMAL HIGH (ref 8–23)
CO2: 22 mmol/L (ref 22–32)
Calcium: 9.1 mg/dL (ref 8.9–10.3)
Chloride: 107 mmol/L (ref 98–111)
Creatinine, Ser: 2.05 mg/dL — ABNORMAL HIGH (ref 0.44–1.00)
GFR, Estimated: 22 mL/min — ABNORMAL LOW (ref >60)
Glucose, Bld: 118 mg/dL — ABNORMAL HIGH (ref 70–99)
Potassium: 5 mmol/L (ref 3.5–5.1)
Sodium: 137 mmol/L (ref 135–145)

## 2024-03-02 LAB — CBC
HCT: 33.6 % — ABNORMAL LOW (ref 36.0–46.0)
Hemoglobin: 11.2 g/dL — ABNORMAL LOW (ref 12.0–15.0)
MCH: 32.2 pg (ref 26.0–34.0)
MCHC: 33.3 g/dL (ref 30.0–36.0)
MCV: 96.6 fL (ref 80.0–100.0)
Platelets: 160 10*3/uL (ref 150–400)
RBC: 3.48 MIL/uL — ABNORMAL LOW (ref 3.87–5.11)
RDW: 13.9 % (ref 11.5–15.5)
WBC: 6 10*3/uL (ref 4.0–10.5)
nRBC: 0 % (ref 0.0–0.2)

## 2024-03-02 NOTE — ED Notes (Signed)
 Received call from [facility], MD Cardiologist who reports pt sent over due to K+ 6.1 result and need for redraw to confirm and if high to treat accordingly and he will follow up with pt care.

## 2024-03-02 NOTE — ED Triage Notes (Signed)
 Pt to ED via POV c/o abnormal labs. Pt was at cardiologist and they drew labs. Potassium was 6.1. pt denies CP, SOB, fevers, dizziness

## 2024-03-03 ENCOUNTER — Emergency Department
Admission: EM | Admit: 2024-03-03 | Discharge: 2024-03-03 | Discharge status: Against Medical Advice | Attending: Emergency Medicine | Admitting: Emergency Medicine

## 2024-03-03 NOTE — ED Notes (Signed)
 Pt left with daughter.

## 2024-03-11 ENCOUNTER — Encounter (INDEPENDENT_AMBULATORY_CARE_PROVIDER_SITE_OTHER): Admitting: Vascular Surgery

## 2024-03-12 ENCOUNTER — Encounter: Payer: Self-pay | Admitting: Internal Medicine

## 2024-03-19 ENCOUNTER — Ambulatory Visit (INDEPENDENT_AMBULATORY_CARE_PROVIDER_SITE_OTHER): Admitting: Vascular Surgery

## 2024-03-19 ENCOUNTER — Encounter (INDEPENDENT_AMBULATORY_CARE_PROVIDER_SITE_OTHER): Payer: Self-pay | Admitting: Vascular Surgery

## 2024-03-19 VITALS — BP 131/57 | HR 60 | Resp 16 | Ht 60.0 in | Wt 141.2 lb

## 2024-03-19 DIAGNOSIS — I890 Lymphedema, not elsewhere classified: Secondary | ICD-10-CM | POA: Diagnosis not present

## 2024-03-19 DIAGNOSIS — I480 Paroxysmal atrial fibrillation: Secondary | ICD-10-CM | POA: Diagnosis not present

## 2024-03-19 DIAGNOSIS — N1832 Chronic kidney disease, stage 3b: Secondary | ICD-10-CM | POA: Diagnosis not present

## 2024-03-19 NOTE — Progress Notes (Signed)
 Subjective:    Patient ID: Cheyenne [name], female    DOB: Aug 28, 1928, 89 y.o.   MRN: [id number] Chief Complaint  Patient presents with   New Patient (Initial Visit)    Ref [facility] consult ble edema    Cheyenne [name] is a 89 year old female who presents to clinic with chief complaint of bilateral lower extremity swelling.  Patient's daughter is with her at her side today.  Patient endorses swelling a couple of years ago but has not been as prominent as it is now.  Patient does see Dr. Lera for heart failure and blood pressure issues.  Patient's daughter today says they changed out recently some of her cardiac medications that may induce some swelling to help.  Patient describes her lower extremities as being tight and throbbing but not painful.  It does interfere with her ability to ambulate and move around at her age.  She endorses that she has not tried any conventional therapy at this time.  She also endorses she has not had any ultrasounds or any testing of her lower extremities.    Review of Systems  Constitutional: Negative.   Cardiovascular:  Positive for leg swelling.  All other systems reviewed and are negative.      Objective:   Physical Exam Constitutional:      Appearance: Normal appearance. She is obese.  HENT:     Head: Normocephalic.  Eyes:     Pupils: Pupils are equal, round, and reactive to light.  Cardiovascular:     Rate and Rhythm: Normal rate and regular rhythm.     Pulses: Normal pulses.     Heart sounds: Normal heart sounds.  Pulmonary:     Effort: Pulmonary effort is normal.     Breath sounds: Normal breath sounds.  Abdominal:     General: Abdomen is flat. Bowel sounds are normal.     Palpations: Abdomen is soft.  Musculoskeletal:        General: Swelling present.     Right lower leg: Edema present.     Left lower leg: Edema present.  Skin:    General: Skin is warm and dry.     Capillary Refill: Capillary refill takes 2 to 3 seconds.   Neurological:     General: No focal deficit present.     Mental Status: She is alert and oriented to person, place, and time. Mental status is at baseline.  Psychiatric:        Mood and Affect: Mood normal.        Behavior: Behavior normal.        Thought Content: Thought content normal.        Judgment: Judgment normal.     BP (!) 131/57   Pulse 60   Resp 16   Ht 5' (1.524 m)   Wt 141 lb 3.2 oz (64 kg)   BMI 27.58 kg/m   Past Medical History:  Diagnosis Date   A-fib (HCC)    a.) CHA2DS2VASc = 6 (age x 2, sex, HFrEF, HTN, NSTEMI);  b.) rate/rhythm maintained on oral metoprolol  succinate; chronically anticoagulated with apixaban  + clopidogrel    Allergic rhinitis    Anemia    Anxiety    Arthritis    Asthma    Breast cancer, right (HCC) 1995   a.) s/p surgical resection (mastectomy) + chemotherapy + 5 years endocrine therapy (tamoxifen)   CAD (coronary artery disease)    a.) LHC 09/15/2021: 90% mLAD, 70% D1, 99% pLCx, 95% mRCA.  Carpal tunnel syndrome    Cervical radiculopathy due to degenerative joint disease of spine    Chronic kidney disease (CKD), stage IV (severe) (HCC)    Chronic use of opiate for therapeutic purpose    a.) has naloxone Rx in place   Depression    Diverticulitis    Diverticulosis    DOE (dyspnea on exertion)    Followed by palliative care service    GERD (gastroesophageal reflux disease)    H. pylori infection 06/2005   H/O bilateral cataract extraction 12/2005   HFrEF (heart failure with reduced ejection fraction) (HCC) 09/14/2021   a.) newly Dx'd. EF 35%, glob HK, sep/inf/post AK, mild LAE, triv AR/PR, mild MR/TR, GLS -9.1%, G2DD; b.) TTE 02/14/2022: EF 60-65%, mild-mod MR, G1DD   History of spinal fracture 1990   a.) T7 and T12 fractures - traumatic sequela of a mechanical fall   HLD (hyperlipidemia)    HTN (hypertension)    Impaired glucose tolerance    LBBB (left bundle branch block)    Long term current use of anticoagulant    a.)  dose reduced apixaban    Long term current use of antithrombotics/antiplatelets    a.) clopidogrel    Lumbago    Lumbar radiculopathy    Macular degeneration    NSTEMI (non-ST elevated myocardial infarction) (HCC) 09/13/2021   a.) LHC/PCI 09/15/2021: 90% mLAD (plans for staged PCI), 70% D1, 99% pLCx (2.75 x 30 mm Resolute Onyx Frontier DES), 95% mRCA (3.5 x 15 mm Resolute Onyx Frontier DES); b.) staged PCI 09/19/2021 - PTCA/DES x1 (unknown type) to mLAD   Osteoporosis    Ovarian cyst    Paresthesia of both hands    Peripheral edema    PVD (peripheral vascular disease) (HCC)    Vitamin D deficiency     Social History   Socioeconomic History   Marital status: Widowed    Spouse name: Not on file   Number of children: Not on file   Years of education: Not on file   Highest education level: Not on file  Occupational History   Not on file  Tobacco Use   Smoking status: Never   Smokeless tobacco: Never  Vaping Use   Vaping status: Never Used  Substance and Sexual Activity   Alcohol  use: Never   Drug use: Never   Sexual activity: Not on file  Other Topics Concern   Not on file  Social History Narrative   Lives alone   Social Drivers of Health   Financial Resource Strain: Low Risk  (12/03/2023)   Received from [facility]   Overall Financial Resource Strain (CARDIA)    Difficulty of Paying Living Expenses: Not hard at all  Food Insecurity: No Food Insecurity (12/03/2023)   Received from [facility]   Hunger Vital Sign    Within the past 12 months, you worried that your food would run out before you got the money to buy more.: Never true    Within the past 12 months, the food you bought just didn't last and you didn't have money to get more.: Never true  Transportation Needs: No Transportation Needs (12/03/2023)   Received from [facility] - Transportation    In the past 12 months, has lack of transportation kept  you from medical appointments or from getting medications?: No    Lack of Transportation (Non-Medical): No  Physical Activity: Not on file  Stress: Stress Concern Present (10/29/2017)  Received from [facility]   [employer] of Occupational Health - Occupational Stress Questionnaire    Feeling of Stress : To some extent  Social Connections: Moderately Integrated (10/29/2017)   Received from [facility]   Social Connection and Isolation Panel    Frequency of Communication with Friends and Family: More than three times a week    Frequency of Social Gatherings with Friends and Family: More than three times a week    Attends Religious Services: More than 4 times per year    Active Member of [employer] or Organizations: Yes    Attends Banker Meetings: More than 4 times per year    Marital Status: Widowed  Intimate Partner Violence: Not At Risk (05/02/2023)   Humiliation, Afraid, Rape, and Kick questionnaire    Fear of Current or Ex-Partner: No    Emotionally Abused: No    Physically Abused: No    Sexually Abused: No    Past Surgical History:  Procedure Laterality Date   ABDOMINAL HYSTERECTOMY  1972   CARPAL TUNNEL RELEASE Right 08/10/2019   CATARACT EXTRACTION Bilateral 2007   COLONOSCOPY     CORONARY ANGIOPLASTY WITH STENT PLACEMENT Left 09/15/2021   Procedure: CORONARY ANGIOPLASTY WITH STENT PLACEMENT; Location: [facility]; Surgeon: Paticia Sharp, MD   CORONARY ANGIOPLASTY WITH STENT PLACEMENT Left 09/19/2021   Procedure: CORONARY ANGIOPLASTY WITH STENT PLACEMENT; Location: [facility]; Surgeon: Lynnie Muta, MD   IR BONE MARROW BIOPSY & ASPIRATION  05/13/2023   LUMBAR LAMINECTOMY/DECOMPRESSION MICRODISCECTOMY Left 07/25/2022   Procedure: LEFT L4-5 LAMINOFORAMINOTOMY;  Surgeon: Clois Fret, MD;  Location: [facility];  Service: Neurosurgery;  Laterality: Left;   MASTECTOMY Right 1999   SIGMOIDOSCOPY  2004   UPPER GASTROINTESTINAL ENDOSCOPY   2006    Family History  Problem Relation Age of Onset   Multiple myeloma Daughter     Allergies  Allergen Reactions   Alendronate     Other reaction(s): Abdominal Pain, Other (See Comments), Other (See Comments) abd pain abd pain abd pain    Levofloxacin     Other reaction(s): Muscle Pain   Codeine Nausea Only   Propoxyphene Nausea Only       Latest Ref Rng & Units 03/02/2024    8:40 PM 01/06/2024   11:00 AM 10/08/2023   10:36 AM  CBC  WBC 4.0 - 10.5 K/uL 6.0  4.8  4.7   Hemoglobin 12.0 - 15.0 g/dL 88.7  89.6  89.3   Hematocrit 36.0 - 46.0 % 33.6  31.4  31.8   Platelets 150 - 400 K/uL 160  126  134       CMP     Component Value Date/Time   NA 137 03/02/2024 2040   K 5.0 03/02/2024 2040   CL 107 03/02/2024 2040   CO2 22 03/02/2024 2040   GLUCOSE 118 (H) 03/02/2024 2040   BUN 46 (H) 03/02/2024 2040   CREATININE 2.05 (H) 03/02/2024 2040   CREATININE 1.91 (H) 01/06/2024 1100   CALCIUM 9.1 03/02/2024 2040   PROT 7.0 01/06/2024 1100   ALBUMIN 3.9 01/06/2024 1100   AST 18 01/06/2024 1100   ALT 11 01/06/2024 1100   ALKPHOS 63 01/06/2024 1100   BILITOT 0.6 01/06/2024 1100   GFRNONAA 22 (L) 03/02/2024 2040   GFRNONAA 24 (L) 01/06/2024 1100     No results found.     Assessment & Plan:   1. Lymphedema (Primary) Recommend:  I have had a long discussion with the patient regarding  swelling and why it  causes symptoms.  Patient will begin wearing graduated compression on a daily basis a prescription was given. The patient will  wear the stockings first thing in the morning and removing them in the evening. The patient is instructed specifically not to sleep in the stockings.   In addition, behavioral modification will be initiated.  This will include frequent elevation, use of over the counter pain medications and exercise such as walking.  Consideration for a lymph pump will also be made based upon the effectiveness of conservative therapy.  This would help to  improve the edema control and prevent sequela such as ulcers and infections   Patient should undergo duplex ultrasound of the venous system to ensure that DVT or reflux is not present.  The patient will follow-up with me after the ultrasound in 3 months to reassess overall lymphedema.  2. Paroxysmal atrial fibrillation (HCC) Continue antiarrhythmia medications as already ordered, these medications have been reviewed and there are no changes at this time.  Continue anticoagulation as ordered by Cardiology Service  3. Chronic kidney disease, stage 3b (HCC) Continue antihypertensive medications as already ordered, these medications have been reviewed and there are no changes at this time. These will assist in preservation of Kidneys and help control overall body fluids.   Current Outpatient Medications on File Prior to Visit  Medication Sig Dispense Refill   acetaminophen  (TYLENOL ) 500 MG tablet Take 1,000 mg by mouth every 6 (six) hours as needed.     Cholecalciferol (VITAMIN D3 PO) Take by mouth daily.     clopidogrel  (PLAVIX ) 75 MG tablet Take 75 mg by mouth daily.     cyanocobalamin 1000 MCG tablet TAKE 1 TABLET EVERY DAY FOR VITAMIN B12 DEFICIENCY     fexofenadine (ALLEGRA) 180 MG tablet Take 180 mg by mouth daily.     fluticasone (FLONASE) 50 MCG/ACT nasal spray Place 1 spray into both nostrils daily.     ipratropium (ATROVENT) 0.06 % nasal spray Place 2 sprays into the nose 3 (three) times daily.     lidocaine  (LIDODERM ) 5 % Place 1-2 patches onto the skin.     lisinopril  (ZESTRIL ) 2.5 MG tablet Take 2.5 mg by mouth daily.     metoprolol  succinate (TOPROL -XL) 25 MG 24 hr tablet Take 25 mg by mouth daily.     Multiple Vitamins-Minerals (PRESERVISION AREDS 2) CAPS Take 1 capsule by mouth daily.     omeprazole (PRILOSEC) 40 MG capsule Take 40 mg by mouth daily.     pregabalin  (LYRICA ) 150 MG capsule Take 150 mg by mouth.     torsemide  (DEMADEX ) 20 MG tablet Take 20 mg by mouth daily.      naloxone (NARCAN) nasal spray 4 mg/0.1 mL SMARTSIG:Both Nares (Patient not taking: Reported on 03/19/2024)     nitroGLYCERIN  (NITROSTAT ) 0.4 MG SL tablet  (Patient not taking: Reported on 03/19/2024)     No current facility-administered medications on file prior to visit.    There are no Patient Instructions on file for this visit. No follow-ups on file.   Gwendlyn JONELLE Shank, NP

## 2024-05-15 NOTE — Progress Notes (Signed)
 05/11/24 Silver Bristle, CMA    05/14/24  5:52 PM Lab results CCM time 4 mins   05/08/24 total time for encounter 10 min's VG & JP  Silver Bristle, [facility]    05/14/24  5:51 PM Note    Mychart message Lauraine 8 mins response to patient and meds sent in      04/23/24 7 CCM time for refill

## 2024-06-09 ENCOUNTER — Other Ambulatory Visit: Payer: Self-pay

## 2024-06-09 ENCOUNTER — Emergency Department

## 2024-06-09 ENCOUNTER — Inpatient Hospital Stay
Admission: EM | Admit: 2024-06-09 | Discharge: 2024-06-11 | DRG: 871 | Disposition: A | Discharge status: Home / Self Care | Attending: Internal Medicine | Admitting: Internal Medicine

## 2024-06-09 DIAGNOSIS — K219 Gastro-esophageal reflux disease without esophagitis: Secondary | ICD-10-CM | POA: Diagnosis present

## 2024-06-09 DIAGNOSIS — Z9011 Acquired absence of right breast and nipple: Secondary | ICD-10-CM

## 2024-06-09 DIAGNOSIS — R680 Hypothermia, not associated with low environmental temperature: Secondary | ICD-10-CM | POA: Diagnosis present

## 2024-06-09 DIAGNOSIS — I2510 Atherosclerotic heart disease of native coronary artery without angina pectoris: Secondary | ICD-10-CM | POA: Diagnosis present

## 2024-06-09 DIAGNOSIS — I5032 Chronic diastolic (congestive) heart failure: Secondary | ICD-10-CM | POA: Diagnosis present

## 2024-06-09 DIAGNOSIS — Z955 Presence of coronary angioplasty implant and graft: Secondary | ICD-10-CM

## 2024-06-09 DIAGNOSIS — I739 Peripheral vascular disease, unspecified: Secondary | ICD-10-CM | POA: Diagnosis present

## 2024-06-09 DIAGNOSIS — R9431 Abnormal electrocardiogram [ECG] [EKG]: Secondary | ICD-10-CM | POA: Diagnosis present

## 2024-06-09 DIAGNOSIS — N179 Acute kidney failure, unspecified: Secondary | ICD-10-CM | POA: Diagnosis present

## 2024-06-09 DIAGNOSIS — J189 Pneumonia, unspecified organism: Secondary | ICD-10-CM | POA: Diagnosis present

## 2024-06-09 DIAGNOSIS — A419 Sepsis, unspecified organism: Principal | ICD-10-CM | POA: Diagnosis present

## 2024-06-09 DIAGNOSIS — R579 Shock, unspecified: Secondary | ICD-10-CM | POA: Diagnosis present

## 2024-06-09 DIAGNOSIS — Z888 Allergy status to other drugs, medicaments and biological substances status: Secondary | ICD-10-CM

## 2024-06-09 DIAGNOSIS — Z79899 Other long term (current) drug therapy: Secondary | ICD-10-CM

## 2024-06-09 DIAGNOSIS — Z66 Do not resuscitate: Secondary | ICD-10-CM | POA: Diagnosis present

## 2024-06-09 DIAGNOSIS — Z885 Allergy status to narcotic agent status: Secondary | ICD-10-CM

## 2024-06-09 DIAGNOSIS — N184 Chronic kidney disease, stage 4 (severe): Secondary | ICD-10-CM | POA: Diagnosis present

## 2024-06-09 DIAGNOSIS — Z7901 Long term (current) use of anticoagulants: Secondary | ICD-10-CM

## 2024-06-09 DIAGNOSIS — R55 Syncope and collapse: Secondary | ICD-10-CM | POA: Diagnosis present

## 2024-06-09 DIAGNOSIS — E559 Vitamin D deficiency, unspecified: Secondary | ICD-10-CM | POA: Diagnosis present

## 2024-06-09 DIAGNOSIS — Z881 Allergy status to other antibiotic agents status: Secondary | ICD-10-CM

## 2024-06-09 DIAGNOSIS — D631 Anemia in chronic kidney disease: Secondary | ICD-10-CM | POA: Diagnosis present

## 2024-06-09 DIAGNOSIS — D696 Thrombocytopenia, unspecified: Secondary | ICD-10-CM | POA: Diagnosis not present

## 2024-06-09 DIAGNOSIS — I480 Paroxysmal atrial fibrillation: Secondary | ICD-10-CM | POA: Diagnosis present

## 2024-06-09 DIAGNOSIS — J690 Pneumonitis due to inhalation of food and vomit: Secondary | ICD-10-CM | POA: Diagnosis present

## 2024-06-09 DIAGNOSIS — I130 Hypertensive heart and chronic kidney disease with heart failure and stage 1 through stage 4 chronic kidney disease, or unspecified chronic kidney disease: Secondary | ICD-10-CM | POA: Diagnosis present

## 2024-06-09 DIAGNOSIS — Z9221 Personal history of antineoplastic chemotherapy: Secondary | ICD-10-CM | POA: Diagnosis not present

## 2024-06-09 DIAGNOSIS — I272 Pulmonary hypertension, unspecified: Secondary | ICD-10-CM | POA: Diagnosis present

## 2024-06-09 DIAGNOSIS — E785 Hyperlipidemia, unspecified: Secondary | ICD-10-CM | POA: Diagnosis present

## 2024-06-09 DIAGNOSIS — J9601 Acute respiratory failure with hypoxia: Secondary | ICD-10-CM | POA: Diagnosis present

## 2024-06-09 DIAGNOSIS — Z853 Personal history of malignant neoplasm of breast: Secondary | ICD-10-CM

## 2024-06-09 DIAGNOSIS — I959 Hypotension, unspecified: Secondary | ICD-10-CM | POA: Diagnosis not present

## 2024-06-09 DIAGNOSIS — I252 Old myocardial infarction: Secondary | ICD-10-CM | POA: Diagnosis not present

## 2024-06-09 DIAGNOSIS — Z90710 Acquired absence of both cervix and uterus: Secondary | ICD-10-CM

## 2024-06-09 DIAGNOSIS — Z807 Family history of other malignant neoplasms of lymphoid, hematopoietic and related tissues: Secondary | ICD-10-CM

## 2024-06-09 DIAGNOSIS — C9000 Multiple myeloma not having achieved remission: Secondary | ICD-10-CM | POA: Diagnosis present

## 2024-06-09 DIAGNOSIS — Z1152 Encounter for screening for COVID-19: Secondary | ICD-10-CM | POA: Diagnosis not present

## 2024-06-09 DIAGNOSIS — Z7902 Long term (current) use of antithrombotics/antiplatelets: Secondary | ICD-10-CM

## 2024-06-09 DIAGNOSIS — N1832 Chronic kidney disease, stage 3b: Secondary | ICD-10-CM | POA: Diagnosis present

## 2024-06-09 DIAGNOSIS — T17908A Unspecified foreign body in respiratory tract, part unspecified causing other injury, initial encounter: Secondary | ICD-10-CM | POA: Diagnosis present

## 2024-06-09 DIAGNOSIS — R571 Hypovolemic shock: Secondary | ICD-10-CM | POA: Diagnosis present

## 2024-06-09 DIAGNOSIS — T68XXXA Hypothermia, initial encounter: Secondary | ICD-10-CM | POA: Diagnosis present

## 2024-06-09 LAB — COMPREHENSIVE METABOLIC PANEL WITH GFR
ALT: 12 U/L (ref 0–44)
AST: 19 U/L (ref 15–41)
Albumin: 3.5 g/dL (ref 3.5–5.0)
Alkaline Phosphatase: 50 U/L (ref 38–126)
Anion gap: 12 (ref 5–15)
BUN: 50 mg/dL — ABNORMAL HIGH (ref 8–23)
CO2: 22 mmol/L (ref 22–32)
Calcium: 8.5 mg/dL — ABNORMAL LOW (ref 8.9–10.3)
Chloride: 102 mmol/L (ref 98–111)
Creatinine, Ser: 2.42 mg/dL — ABNORMAL HIGH (ref 0.44–1.00)
GFR, Estimated: 18 mL/min — ABNORMAL LOW (ref >60)
Glucose, Bld: 121 mg/dL — ABNORMAL HIGH (ref 70–99)
Potassium: 4.7 mmol/L (ref 3.5–5.1)
Sodium: 136 mmol/L (ref 135–145)
Total Bilirubin: 0.7 mg/dL (ref 0.0–1.2)
Total Protein: 6.7 g/dL (ref 6.5–8.1)

## 2024-06-09 LAB — CBC WITH DIFFERENTIAL/PLATELET
Abs Immature Granulocytes: 0.01 K/uL (ref 0.00–0.07)
Basophils Absolute: 0 K/uL (ref 0.0–0.1)
Basophils Relative: 1 %
Eosinophils Absolute: 0.3 K/uL (ref 0.0–0.5)
Eosinophils Relative: 5 %
HCT: 32.2 % — ABNORMAL LOW (ref 36.0–46.0)
Hemoglobin: 10.4 g/dL — ABNORMAL LOW (ref 12.0–15.0)
Immature Granulocytes: 0 %
Lymphocytes Relative: 33 %
Lymphs Abs: 2.1 K/uL (ref 0.7–4.0)
MCH: 31.9 pg (ref 26.0–34.0)
MCHC: 32.3 g/dL (ref 30.0–36.0)
MCV: 98.8 fL (ref 80.0–100.0)
Monocytes Absolute: 0.7 K/uL (ref 0.1–1.0)
Monocytes Relative: 11 %
Neutro Abs: 3.3 K/uL (ref 1.7–7.7)
Neutrophils Relative %: 50 %
Platelets: 147 K/uL — ABNORMAL LOW (ref 150–400)
RBC: 3.26 MIL/uL — ABNORMAL LOW (ref 3.87–5.11)
RDW: 14.4 % (ref 11.5–15.5)
WBC: 6.5 K/uL (ref 4.0–10.5)
nRBC: 0 % (ref 0.0–0.2)

## 2024-06-09 LAB — TROPONIN I (HIGH SENSITIVITY)
Troponin I (High Sensitivity): 10 ng/L (ref <18)
Troponin I (High Sensitivity): 11 ng/L (ref <18)

## 2024-06-09 LAB — CREATININE, SERUM
Creatinine, Ser: 2.46 mg/dL — ABNORMAL HIGH (ref 0.44–1.00)
GFR, Estimated: 18 mL/min — ABNORMAL LOW (ref >60)

## 2024-06-09 LAB — URINALYSIS, ROUTINE W REFLEX MICROSCOPIC
Bilirubin Urine: NEGATIVE
Glucose, UA: NEGATIVE mg/dL
Hgb urine dipstick: NEGATIVE
Ketones, ur: NEGATIVE mg/dL
Leukocytes,Ua: NEGATIVE
Nitrite: NEGATIVE
Protein, ur: NEGATIVE mg/dL
Specific Gravity, Urine: 1.008 (ref 1.005–1.030)
pH: 6 (ref 5.0–8.0)

## 2024-06-09 LAB — RESP PANEL BY RT-PCR (RSV, FLU A&B, COVID)  RVPGX2
Influenza A by PCR: NEGATIVE
Influenza B by PCR: NEGATIVE
Resp Syncytial Virus by PCR: NEGATIVE
SARS Coronavirus 2 by RT PCR: NEGATIVE

## 2024-06-09 LAB — LACTIC ACID, PLASMA: Lactic Acid, Venous: 1.6 mmol/L (ref 0.5–1.9)

## 2024-06-09 MED ORDER — ALBUTEROL SULFATE (2.5 MG/3ML) 0.083% IN NEBU
2.5000 mg | INHALATION_SOLUTION | Freq: Four times a day (QID) | RESPIRATORY_TRACT | Status: DC
  Administered 2024-06-10: 2.5 mg via RESPIRATORY_TRACT
  Filled 2024-06-09: qty 3

## 2024-06-09 MED ORDER — IPRATROPIUM BROMIDE 0.02 % IN SOLN
0.5000 mg | Freq: Four times a day (QID) | RESPIRATORY_TRACT | Status: DC
  Administered 2024-06-10: 0.5 mg via RESPIRATORY_TRACT
  Filled 2024-06-09: qty 2.5

## 2024-06-09 MED ORDER — SODIUM CHLORIDE 0.9 % IV BOLUS
500.0000 mL | Freq: Once | INTRAVENOUS | Status: AC
  Administered 2024-06-09: 500 mL via INTRAVENOUS

## 2024-06-09 MED ORDER — BENZONATATE 100 MG PO CAPS: 100.0000 mg | ORAL_CAPSULE | Freq: Three times a day (TID) | ORAL | Status: DC | PRN

## 2024-06-09 MED ORDER — ENOXAPARIN SODIUM 40 MG/0.4ML IJ SOSY: 40.0000 mg | PREFILLED_SYRINGE | INTRAMUSCULAR | Status: DC

## 2024-06-09 MED ORDER — SODIUM CHLORIDE 0.9 % IV SOLN: INTRAVENOUS | Status: AC

## 2024-06-09 MED ORDER — HEPARIN SODIUM (PORCINE) 5000 UNIT/ML IJ SOLN
5000.0000 [IU] | Freq: Three times a day (TID) | INTRAMUSCULAR | Status: DC
Start: 2024-06-10 — End: 2024-06-11
  Administered 2024-06-10 – 2024-06-11 (×5): 5000 [IU] via SUBCUTANEOUS
  Filled 2024-06-09 (×5): qty 1

## 2024-06-09 MED ORDER — PIPERACILLIN-TAZOBACTAM 3.375 G IVPB: 3.3750 g | Freq: Three times a day (TID) | INTRAVENOUS | Status: DC

## 2024-06-09 MED ORDER — PIPERACILLIN-TAZOBACTAM IN DEX 2-0.25 GM/50ML IV SOLN
2.2500 g | Freq: Four times a day (QID) | INTRAVENOUS | Status: DC
  Administered 2024-06-10 (×2): 2.25 g via INTRAVENOUS
  Filled 2024-06-09 (×3): qty 50

## 2024-06-09 MED ORDER — BUDESONIDE 0.25 MG/2ML IN SUSP
0.2500 mg | Freq: Two times a day (BID) | RESPIRATORY_TRACT | Status: DC
  Administered 2024-06-09: 0.25 mg via RESPIRATORY_TRACT
  Filled 2024-06-09: qty 2

## 2024-06-09 MED ORDER — SODIUM CHLORIDE 0.9 % IV SOLN
3.0000 g | Freq: Once | INTRAVENOUS | Status: AC
  Administered 2024-06-09: 3 g via INTRAVENOUS
  Filled 2024-06-09: qty 8

## 2024-06-09 NOTE — ED Triage Notes (Incomplete)
 EMS reports 1943 pt had a sudden onset of unresponsiveness per her family. Reports it sees as if she fell asleep. Responded to pain. Initially was hypotensive and 86% on RA, placed on 4L [place] and had an increase to 95%. Was given 500mL of saline. Last BP 110/60. FSBS 143. EMS reports she lives at home with her son. Pt currently A&Ox4. Pt denies pain but reports weakness throughout.

## 2024-06-09 NOTE — Assessment & Plan Note (Signed)
 Patient is currently on the bair hugger at this time

## 2024-06-09 NOTE — Assessment & Plan Note (Addendum)
 Chronic stable

## 2024-06-09 NOTE — Progress Notes (Signed)
 PHARMACY NOTE:  ANTIMICROBIAL RENAL DOSAGE ADJUSTMENT  Current antimicrobial regimen includes a mismatch between antimicrobial dosage and estimated renal function.  As per policy approved by the Pharmacy & Therapeutics and Medical Executive Committees, the antimicrobial dosage will be adjusted accordingly.  Current antimicrobial dosage:  Zosyn  3.375 gm IV Q8H EI   Indication: HCAP   Renal Function:  Estimated Creatinine Clearance: 12.4 mL/min (A) (by C-G formula based on SCr of 2.42 mg/dL (H)). []      On intermittent HD, scheduled: []      On CRRT    Antimicrobial dosage has been changed to:  Zosyn  2.25 gm IV Q6H   Additional comments:   Thank you for allowing pharmacy to be a part of this patient's care.  Julya Alioto D, [facility] 06/09/2024 11:22 PM

## 2024-06-09 NOTE — Progress Notes (Signed)
 Anticoagulation monitoring(Lovenox ):  89 yo female ordered Lovenox  40 mg Q24h    Filed Weights   06/09/24 2040  Weight: 66.4 kg (146 lb 6.2 oz)   BMI 28.59   Lab Results  Component Value Date   CREATININE 2.42 (H) 06/09/2024   CREATININE 2.05 (H) 03/02/2024   CREATININE 1.91 (H) 01/06/2024   Estimated Creatinine Clearance: 12.4 mL/min (A) (by C-G formula based on SCr of 2.42 mg/dL (H)). Hemoglobin & Hematocrit     Component Value Date/Time   HGB 10.4 (L) 06/09/2024 2100   HGB 10.3 (L) 01/06/2024 1100   HCT 32.2 (L) 06/09/2024 2100     Per Protocol for Patient with estCrcl < 15 ml/min and BMI < 40, will transition to Heparin  5000 units SQ Q8H.

## 2024-06-09 NOTE — ED Provider Notes (Signed)
 [facility] Provider Note    Event Date/Time   First MD Initiated Contact with Patient 06/09/24 2054     (approximate)   History   Weakness   HPI  Cheyenne [name] is a 89 y.o. female with multiple myeloma who comes in with concerns for unresponsive episode.  Family report around 40 she had episode of unresponsiveness where she was eating food and they were not able to wake her up.  She was initially hypoxic with EMS placed on 4 L and hypotensive and given 500 cc of fluid.  Patient lives at home with her son.  She denies any report of any pain.  No chest pain no abdominal pain no shortness of breath.  She is back to her normal self now.  Physical Exam   Triage Vital Signs: ED Triage Vitals  Encounter Vitals Group     BP 06/09/24 2041 (!) 116/52     Girls Systolic BP Percentile --      Girls Diastolic BP Percentile --      Boys Systolic BP Percentile --      Boys Diastolic BP Percentile --      Pulse Rate 06/09/24 2039 70     Resp 06/09/24 2058 16     Temp 06/09/24 2058 (!) 95.3 F (35.2 C)     Temp Source 06/09/24 2058 Rectal     SpO2 06/09/24 2039 91 %     Weight 06/09/24 2040 146 lb 6.2 oz (66.4 kg)     Height 06/09/24 2040 5' (1.524 m)     Head Circumference --      Peak Flow --      Pain Score 06/09/24 2040 0     Pain Loc --      Pain Education --      Exclude from Growth Chart --     Most recent vital signs: Vitals:   06/09/24 2058 06/09/24 2058  BP:    Pulse:    Resp:  16  Temp: (!) 95.3 F (35.2 C)   SpO2:       General: Awake, no distress.  CV:  Good peripheral perfusion.  Resp:  Normal effort.  Clear lungs, on 4 L Abd:  No distention.  Soft and nontender Other:  No swelling in legs.  No calf tenderness Cranial nerves appear intact equal strength in arms and legs.  Alert and oriented x 3  ED Results / Procedures / Treatments   Labs (all labs ordered are listed, but only abnormal results are displayed) Labs Reviewed   CBC WITH DIFFERENTIAL/PLATELET - Abnormal; Notable for the following components:      Result Value   RBC 3.26 (*)    Hemoglobin 10.4 (*)    HCT 32.2 (*)    Platelets 147 (*)    All other components within normal limits  COMPREHENSIVE METABOLIC PANEL WITH GFR - Abnormal; Notable for the following components:   Glucose, Bld 121 (*)    BUN 50 (*)    Creatinine, Ser 2.42 (*)    Calcium 8.5 (*)    GFR, Estimated 18 (*)    All other components within normal limits  URINALYSIS, ROUTINE W REFLEX MICROSCOPIC - Abnormal; Notable for the following components:   Color, Urine STRAW (*)    APPearance CLEAR (*)    All other components within normal limits  RESP PANEL BY RT-PCR (RSV, FLU A&B, COVID)  [id number]  LACTIC ACID, PLASMA  TROPONIN I (HIGH SENSITIVITY)  TROPONIN I (HIGH SENSITIVITY)     EKG  My interpretation of EKG:  Sinus rate of 69 without any ST elevation or T wave inversions with left bundle branch block  RADIOLOGY I have reviewed the xray personally and interpreted no evidence of any pneumonia   PROCEDURES:  Critical Care performed: Yes, see critical care procedure note(s)  .1-3 Lead EKG Interpretation  Performed by: Ernest Ronal BRAVO, MD Authorized by: Ernest Ronal BRAVO, MD     Interpretation: normal     ECG rate:  70   ECG rate assessment: normal     Rhythm: sinus rhythm     Ectopy: none     Conduction: normal   .Critical Care  Performed by: Ernest Ronal BRAVO, MD Authorized by: Ernest Ronal BRAVO, MD   Critical care provider statement:    Critical care time (minutes):  30   Critical care was necessary to treat or prevent imminent or life-threatening deterioration of the following conditions:  Respiratory failure   Critical care was time spent personally by me on the following activities:  Development of treatment plan with patient or surrogate, discussions with consultants, evaluation of patient's response to treatment, examination of patient, ordering and review of laboratory  studies, ordering and review of radiographic studies, ordering and performing treatments and interventions, pulse oximetry, re-evaluation of patient's condition and review of old charts    MEDICATIONS ORDERED IN ED: Medications  sodium chloride  0.9 % bolus 500 mL (has no administration in time range)  Ampicillin -Sulbactam (UNASYN ) 3 g in sodium chloride  0.9 % 100 mL IVPB (has no administration in time range)     IMPRESSION / MDM / ASSESSMENT AND PLAN / ED COURSE  I reviewed the triage vital signs and the nursing notes.   Patient's presentation is most consistent with acute presentation with potential threat to life or bodily function.   Patient comes in with unresponsive episode noted to be hypothermic.  Workup was done to evaluate for infection.  Patient does not meet any other sepsis criteria white count is normal.  Normal heart rate normal respiratory rate.  Patient was on 4 L of oxygen  due to concern for hypoxia.  We took patient off of the oxygen  and she desatted down to 88% so placed back on oxygen  chest x-ray without evidence of pneumonia.  Was going to order a CT PE to evaluate for pulmonary embolism but unable to do that secondary to patient's creatinine of 2.4.  Does look like she has had a increasing creatinine I will give her some fluids for this.  Her troponins are negative.  Urine without evidence of UTI COVID and flu are negative.  Given hypoxia, syncope will discuss with the hospital team for admission.  The patient is on the cardiac monitor to evaluate for evidence of arrhythmia and/or significant heart rate changes.      FINAL CLINICAL IMPRESSION(S) / ED DIAGNOSES   Final diagnoses:  Syncope, unspecified syncope type  AKI (acute kidney injury)  Acute respiratory failure with hypoxia (HCC)     Rx / DC Orders   ED Discharge Orders     None        Note:  This document was prepared using Dragon voice recognition software and may include unintentional  dictation errors.   Ernest Ronal BRAVO, MD 06/09/24 2251

## 2024-06-09 NOTE — ED Notes (Signed)
 CCMD contacted and pt placed on monitor.

## 2024-06-09 NOTE — ED Notes (Signed)
Pt placed on bear hugger at this time.

## 2024-06-09 NOTE — Assessment & Plan Note (Signed)
 The patient was found unresponsive at home Orthostatic vital signs Echocardiogram has been ordered for further evaluation Patient will be placed on telemetry overnight

## 2024-06-09 NOTE — Assessment & Plan Note (Signed)
 Source of infection: lungs Blood culture, sputum culture  Imaging: demonstrating possible aspiration pneumonia  IV antibiotics started Patient receiving fluids also

## 2024-06-09 NOTE — Assessment & Plan Note (Addendum)
 Cr is currently 2.4 Baseline is 1.7 Patient will be started on IV fluids Repeat bmp in the morning Will consult nephrology to see patient as well

## 2024-06-09 NOTE — Assessment & Plan Note (Signed)
 Patient is currently on 4l Cheyenne [name] at this time She will be started on breathing treatments Will continue to monitor the patient

## 2024-06-09 NOTE — ED Notes (Signed)
 Pt to remain on bear hugger.

## 2024-06-09 NOTE — H&P (Signed)
 History and Physical    Patient: Cheyenne [name] FMW:[id number] DOB: 05-05-1928 DOA: 06/09/2024 DOS: the patient was seen and examined on 06/09/2024 PCP: Steva Clotilda DEL, NP  Patient coming from: Home  Chief Complaint:  Chief Complaint  Patient presents with   Weakness   HPI: Cheyenne [name] is a 89 y.o. female with medical history significant of atrial fibrillation anemia,  multiple myeloma, depression, asthma, depression, CAD, left bundle branch block, hypertension and hyperlipidemia. The patient presents to the hospital with chief complaint of syncope.   According to the patient, she was sitting at the dinner table and eating supper when she suddenly passed out. According to her daughter, the patient's  neighbors went to the house to take her something to eat and they found the patient slumped in a chair and was not responding at all. She felt very cold and they said they stimulated her without response.   The patient has been complaining about a chronic cough for about 2 weeks. She saw pulmonary had PFTs done. She saw primary today and was given a flu shot.   In the ER, BP108/43, HR 62, RR 14 86% on RA. Temp 95.3. wbc 6.5,Hb/Hct 10.4/32.2 and platelet 147.  Na 136,K 4.7, Cl 102, bicarb 22, Bun/cr 50/2.42 and glucose 121. Troponin initial 10 and repeat 11.. lactic acid is 1.6. EKG demonstrated sinus rhythm prolonged QT interval 507 prolonged PR. CXR no acute cardiopulmonary process.  CT chest demonstrated cardiac enlargement.  No pericardial effusions.  Scarring and postinflammatory calcifications in the lung apices and bases.  Suggestion of superimposed alveolar infiltration and mucous plugging possibly representing inflammatory process or aspiration.  Review of Systems: As mentioned in the history of present illness. All other systems reviewed and are negative.  Past Medical History:  Diagnosis Date   A-fib [facility])    a.) CHA2DS2VASc = 6 (age x 2, sex, HFrEF, HTN, NSTEMI);  b.)  rate/rhythm maintained on oral metoprolol  succinate; chronically anticoagulated with apixaban  + clopidogrel    Allergic rhinitis    Anemia    Anxiety    Arthritis    Asthma    Breast cancer, right (HCC) 1995   a.) s/p surgical resection (mastectomy) + chemotherapy + 5 years endocrine therapy (tamoxifen)   CAD (coronary artery disease)    a.) LHC 09/15/2021: 90% mLAD, 70% D1, 99% pLCx, 95% mRCA.   Carpal tunnel syndrome    Cervical radiculopathy due to degenerative joint disease of spine    Chronic kidney disease (CKD), stage IV (severe) (HCC)    Chronic use of opiate for therapeutic purpose    a.) has naloxone Rx in place   Depression    Diverticulitis    Diverticulosis    DOE (dyspnea on exertion)    Followed by palliative care service    GERD (gastroesophageal reflux disease)    H. pylori infection 06/2005   H/O bilateral cataract extraction 12/2005   HFrEF (heart failure with reduced ejection fraction) (HCC) 09/14/2021   a.) newly Dx'd. EF 35%, glob HK, sep/inf/post AK, mild LAE, triv AR/PR, mild MR/TR, GLS -9.1%, G2DD; b.) TTE 02/14/2022: EF 60-65%, mild-mod MR, G1DD   History of spinal fracture 1990   a.) T7 and T12 fractures - traumatic sequela of a mechanical fall   HLD (hyperlipidemia)    HTN (hypertension)    Impaired glucose tolerance    LBBB (left bundle branch block)    Long term current use of anticoagulant    a.) dose reduced apixaban   Long term current use of antithrombotics/antiplatelets    a.) clopidogrel    Lumbago    Lumbar radiculopathy    Macular degeneration    NSTEMI (non-ST elevated myocardial infarction) (HCC) 09/13/2021   a.) LHC/PCI 09/15/2021: 90% mLAD (plans for staged PCI), 70% D1, 99% pLCx (2.75 x 30 mm Resolute Onyx Frontier DES), 95% mRCA (3.5 x 15 mm Resolute Onyx Frontier DES); b.) staged PCI 09/19/2021 - PTCA/DES x1 (unknown type) to mLAD   Osteoporosis    Ovarian cyst    Paresthesia of both hands    Peripheral edema    PVD (peripheral  vascular disease)    Vitamin D deficiency    Past Surgical History:  Procedure Laterality Date   ABDOMINAL HYSTERECTOMY  1972   CARPAL TUNNEL RELEASE Right 08/10/2019   CATARACT EXTRACTION Bilateral 2007   COLONOSCOPY     CORONARY ANGIOPLASTY WITH STENT PLACEMENT Left 09/15/2021   Procedure: CORONARY ANGIOPLASTY WITH STENT PLACEMENT; Location: [facility]; Surgeon: Paticia Sharp, MD   CORONARY ANGIOPLASTY WITH STENT PLACEMENT Left 09/19/2021   Procedure: CORONARY ANGIOPLASTY WITH STENT PLACEMENT; Location: [facility]; Surgeon: Lynnie Muta, MD   IR BONE MARROW BIOPSY & ASPIRATION  05/13/2023   LUMBAR LAMINECTOMY/DECOMPRESSION MICRODISCECTOMY Left 07/25/2022   Procedure: LEFT L4-5 LAMINOFORAMINOTOMY;  Surgeon: Clois Fret, MD;  Location: [facility];  Service: Neurosurgery;  Laterality: Left;   MASTECTOMY Right 1999   SIGMOIDOSCOPY  2004   UPPER GASTROINTESTINAL ENDOSCOPY  2006   Social History:  reports that she has never smoked. She has never used smokeless tobacco. She reports that she does not drink alcohol  and does not use drugs.  Allergies  Allergen Reactions   Alendronate     Other reaction(s): Abdominal Pain, Other (See Comments), Other (See Comments) abd pain abd pain abd pain    Levofloxacin     Other reaction(s): Muscle Pain   Codeine Nausea Only   Propoxyphene Nausea Only    Family History  Problem Relation Age of Onset   Multiple myeloma Daughter     Prior to Admission medications   Medication Sig Start Date End Date Taking? Authorizing Provider  acetaminophen  (TYLENOL ) 500 MG tablet Take 1,000 mg by mouth every 6 (six) hours as needed.    [provider]  Cholecalciferol (VITAMIN D3 PO) Take by mouth daily.    [provider]  clopidogrel  (PLAVIX ) 75 MG tablet Take 75 mg by mouth daily. 01/16/22   [provider]  cyanocobalamin  1000 MCG tablet TAKE 1 TABLET EVERY DAY FOR VITAMIN B12 DEFICIENCY 11/16/21   [provider]   fexofenadine (ALLEGRA) 180 MG tablet Take 180 mg by mouth daily.    [provider]  fluticasone (FLONASE) 50 MCG/ACT nasal spray Place 1 spray into both nostrils daily. 06/05/23 06/04/24  [provider]  ipratropium (ATROVENT ) 0.06 % nasal spray Place 2 sprays into the nose 3 (three) times daily. 06/05/23 06/04/24  [provider]  lidocaine  (LIDODERM ) 5 % Place 1-2 patches onto the skin. 05/16/22   [provider]  lisinopril  (ZESTRIL ) 2.5 MG tablet Take 2.5 mg by mouth daily. 01/16/22   [provider]  metoprolol  succinate (TOPROL -XL) 25 MG 24 hr tablet Take 25 mg by mouth daily. 01/16/22   [provider]  Multiple Vitamins-Minerals (PRESERVISION AREDS 2) CAPS Take 1 capsule by mouth daily. 06/05/23   [provider]  naloxone TAMMY) nasal spray 4 mg/0.1 mL SMARTSIG:Both Nares Patient not taking: Reported on 03/19/2024 02/08/22   [provider]  nitroGLYCERIN  (NITROSTAT )  0.4 MG SL tablet  01/25/22   [provider]  omeprazole (PRILOSEC) 40 MG capsule Take 40 mg by mouth daily. 06/05/23   [provider]  pregabalin  (LYRICA ) 150 MG capsule Take 150 mg by mouth. 12/16/23   [provider]  torsemide  (DEMADEX ) 20 MG tablet Take 20 mg by mouth daily. 01/29/22   [provider]    Physical Exam: Vitals:   06/09/24 2040 06/09/24 2041 06/09/24 2058 06/09/24 2058  BP:  (!) 116/52    Pulse:      Resp:    16  Temp:   (!) 95.3 F (35.2 C)   TempSrc:   Rectal   SpO2:      Weight: 66.4 kg     Height: 5' (1.524 m)      Data Reviewed:  Described above   Assessment and Plan: * Syncope The patient was found unresponsive at home Orthostatic vital signs Echocardiogram has been ordered for further evaluation Patient will be placed on telemetry overnight   Acute respiratory failure with hypoxia (HCC) Patient is currently on 4l [place] at this time She will be started on breathing treatments Will  continue to monitor the patient   Sepsis [facility]) Source of infection: lungs Blood culture, sputum culture  Imaging: demonstrating possible aspiration pneumonia  IV antibiotics started Patient receiving fluids also    Hypothermia Patient is currently on the bair hugger at this time   Acute renal failure superimposed on stage 3b chronic kidney disease (HCC) Cr is currently 2.4 Baseline is 1.7 Patient will be started on IV fluids Repeat bmp in the morning  Multiple myeloma in relapse [facility]) Chronic stable    Advance Care Planning: full code   Consults: nephrology   Family Communication:   Vicenta Hoover (Daughter) [phone] (Mobile)    Severity of Illness: The appropriate patient status for this patient is INPATIENT. Inpatient status is judged to be reasonable and necessary in order to provide the required intensity of service to ensure the patient's safety. The patient's presenting symptoms, physical exam findings, and initial radiographic and laboratory data in the context of their chronic comorbidities is felt to place them at high risk for further clinical deterioration. Furthermore, it is not anticipated that the patient will be medically stable for discharge from the hospital within 2 midnights of admission.   * I certify that at the point of admission it is my clinical judgment that the patient will require inpatient hospital care spanning beyond 2 midnights from the point of admission due to high intensity of service, high risk for further deterioration and high frequency of surveillance required.*  Author: Clotile Whittington K Charmel Pronovost, MD 06/09/2024 11:02 PM  For on call review [email or web address].

## 2024-06-10 ENCOUNTER — Inpatient Hospital Stay: Admit: 2024-06-10 | Discharge: 2024-06-10 | Disposition: A | Discharge status: Home / Self Care

## 2024-06-10 ENCOUNTER — Inpatient Hospital Stay

## 2024-06-10 DIAGNOSIS — T17908A Unspecified foreign body in respiratory tract, part unspecified causing other injury, initial encounter: Secondary | ICD-10-CM | POA: Diagnosis present

## 2024-06-10 DIAGNOSIS — N179 Acute kidney failure, unspecified: Secondary | ICD-10-CM

## 2024-06-10 DIAGNOSIS — I959 Hypotension, unspecified: Secondary | ICD-10-CM

## 2024-06-10 DIAGNOSIS — R579 Shock, unspecified: Secondary | ICD-10-CM | POA: Diagnosis present

## 2024-06-10 DIAGNOSIS — N184 Chronic kidney disease, stage 4 (severe): Secondary | ICD-10-CM

## 2024-06-10 LAB — CBC
HCT: 26.9 % — ABNORMAL LOW (ref 36.0–46.0)
Hemoglobin: 8.5 g/dL — ABNORMAL LOW (ref 12.0–15.0)
MCH: 31.8 pg (ref 26.0–34.0)
MCHC: 31.6 g/dL (ref 30.0–36.0)
MCV: 100.7 fL — ABNORMAL HIGH (ref 80.0–100.0)
Platelets: 109 K/uL — ABNORMAL LOW (ref 150–400)
RBC: 2.67 MIL/uL — ABNORMAL LOW (ref 3.87–5.11)
RDW: 14.5 % (ref 11.5–15.5)
WBC: 4.9 K/uL (ref 4.0–10.5)
nRBC: 0 % (ref 0.0–0.2)

## 2024-06-10 LAB — COMPREHENSIVE METABOLIC PANEL WITH GFR
ALT: 11 U/L (ref 0–44)
AST: 19 U/L (ref 15–41)
Albumin: 2.9 g/dL — ABNORMAL LOW (ref 3.5–5.0)
Alkaline Phosphatase: 43 U/L (ref 38–126)
Anion gap: 9 (ref 5–15)
BUN: 44 mg/dL — ABNORMAL HIGH (ref 8–23)
CO2: 20 mmol/L — ABNORMAL LOW (ref 22–32)
Calcium: 7.7 mg/dL — ABNORMAL LOW (ref 8.9–10.3)
Chloride: 108 mmol/L (ref 98–111)
Creatinine, Ser: 2.07 mg/dL — ABNORMAL HIGH (ref 0.44–1.00)
GFR, Estimated: 22 mL/min — ABNORMAL LOW (ref >60)
Glucose, Bld: 141 mg/dL — ABNORMAL HIGH (ref 70–99)
Potassium: 4.9 mmol/L (ref 3.5–5.1)
Sodium: 137 mmol/L (ref 135–145)
Total Bilirubin: 0.4 mg/dL (ref 0.0–1.2)
Total Protein: 5.6 g/dL — ABNORMAL LOW (ref 6.5–8.1)

## 2024-06-10 LAB — ECHOCARDIOGRAM COMPLETE
Area-P 1/2: 3.12 cm2
Height: 60 in
S' Lateral: 2.3 cm
Weight: 2342.17 [oz_av]

## 2024-06-10 LAB — RESPIRATORY PANEL BY PCR

## 2024-06-10 LAB — BRAIN NATRIURETIC PEPTIDE: B Natriuretic Peptide: 55.2 pg/mL (ref 0.0–100.0)

## 2024-06-10 LAB — MAGNESIUM: Magnesium: 1.7 mg/dL (ref 1.7–2.4)

## 2024-06-10 LAB — PROCALCITONIN
Procalcitonin: <0.1 ng/mL
Procalcitonin: <0.1 ng/mL

## 2024-06-10 LAB — PHOSPHORUS: Phosphorus: 3.3 mg/dL (ref 2.5–4.6)

## 2024-06-10 LAB — LACTIC ACID, PLASMA: Lactic Acid, Venous: 1.4 mmol/L (ref 0.5–1.9)

## 2024-06-10 LAB — STREP PNEUMONIAE URINARY ANTIGEN: Strep Pneumo Urinary Antigen: NEGATIVE

## 2024-06-10 MED ORDER — MIDODRINE HCL 5 MG PO TABS: 10.0000 mg | ORAL_TABLET | Freq: Three times a day (TID) | ORAL | Status: DC

## 2024-06-10 MED ORDER — VITAMIN B-12 1000 MCG PO TABS
1000.0000 ug | ORAL_TABLET | Freq: Every day | ORAL | Status: DC
  Administered 2024-06-10 – 2024-06-11 (×2): 1000 ug via ORAL
  Filled 2024-06-10: qty 2
  Filled 2024-06-10: qty 1

## 2024-06-10 MED ORDER — CHLORHEXIDINE GLUCONATE CLOTH 2 % EX PADS
6.0000 | MEDICATED_PAD | Freq: Every day | CUTANEOUS | Status: DC
Start: 2024-06-10 — End: 2024-06-11
  Administered 2024-06-11: 6 via TOPICAL
  Filled 2024-06-10: qty 6

## 2024-06-10 MED ORDER — NOREPINEPHRINE 4 MG/250ML-% IV SOLN
0.0000 ug/min | INTRAVENOUS | Status: DC
Start: 2024-06-10 — End: 2024-06-10
  Administered 2024-06-10: 2 ug/min via INTRAVENOUS

## 2024-06-10 MED ORDER — CLOPIDOGREL BISULFATE 75 MG PO TABS
75.0000 mg | ORAL_TABLET | Freq: Every day | ORAL | Status: DC
  Administered 2024-06-10 – 2024-06-11 (×2): 75 mg via ORAL
  Filled 2024-06-10 (×2): qty 1

## 2024-06-10 MED ORDER — POLYVINYL ALCOHOL 1.4 % OP SOLN
1.0000 [drp] | OPHTHALMIC | Status: DC | PRN
  Administered 2024-06-11: 1 [drp] via OPHTHALMIC
  Filled 2024-06-10: qty 15

## 2024-06-10 MED ORDER — SODIUM CHLORIDE 0.9 % IV SOLN
1.0000 g | INTRAVENOUS | Status: AC
  Administered 2024-06-10 – 2024-06-11 (×2): 1 g via INTRAVENOUS
  Filled 2024-06-10 (×2): qty 10

## 2024-06-10 MED ORDER — IPRATROPIUM-ALBUTEROL 0.5-2.5 (3) MG/3ML IN SOLN: 3.0000 mL | RESPIRATORY_TRACT | Status: DC | PRN

## 2024-06-10 MED ORDER — SODIUM CHLORIDE 0.9 % IV SOLN: 250.0000 mL | INTRAVENOUS | Status: DC

## 2024-06-10 MED ORDER — IPRATROPIUM-ALBUTEROL 0.5-2.5 (3) MG/3ML IN SOLN
3.0000 mL | Freq: Four times a day (QID) | RESPIRATORY_TRACT | Status: DC
  Administered 2024-06-10 – 2024-06-11 (×2): 3 mL via RESPIRATORY_TRACT
  Filled 2024-06-10 (×2): qty 3

## 2024-06-10 MED ORDER — HYDROCORTISONE SOD SUC (PF) 100 MG IJ SOLR
100.0000 mg | Freq: Three times a day (TID) | INTRAMUSCULAR | Status: DC
  Administered 2024-06-10: 100 mg via INTRAVENOUS
  Filled 2024-06-10 (×2): qty 2

## 2024-06-10 MED ORDER — SODIUM CHLORIDE 0.9 % IV BOLUS
1000.0000 mL | Freq: Once | INTRAVENOUS | Status: AC
  Administered 2024-06-10: 1000 mL via INTRAVENOUS

## 2024-06-10 MED ORDER — NOREPINEPHRINE 4 MG/250ML-% IV SOLN
0.0000 ug/min | INTRAVENOUS | Status: DC
Start: 2024-06-10 — End: 2024-06-10

## 2024-06-10 MED ORDER — METHYLPREDNISOLONE SODIUM SUCC 40 MG IJ SOLR
20.0000 mg | Freq: Every day | INTRAMUSCULAR | Status: DC
Start: 2024-06-10 — End: 2024-06-11
  Administered 2024-06-10 – 2024-06-11 (×2): 20 mg via INTRAVENOUS
  Filled 2024-06-10 (×2): qty 1

## 2024-06-10 MED ORDER — SODIUM CHLORIDE 0.9 % IV BOLUS
500.0000 mL | Freq: Once | INTRAVENOUS | Status: AC
  Administered 2024-06-10: 500 mL via INTRAVENOUS

## 2024-06-10 MED ORDER — NOREPINEPHRINE 4 MG/250ML-% IV SOLN
0.0000 ug/min | INTRAVENOUS | Status: DC
  Administered 2024-06-10: 2 ug/min via INTRAVENOUS
  Filled 2024-06-10: qty 250

## 2024-06-10 NOTE — ED Notes (Signed)
 Patient nasal canula removed patient is now on room air.

## 2024-06-10 NOTE — ED Notes (Signed)
 This RN at bedside with patient. Pts BP was 83/44. Dr. Lorren made aware. 2L of NS given. Pt BP remains around 98/38. Dr. Lorren consulted with Jenita, NP ICU provider. Reports that pt does not need Levophed  at this time unless MAP is below 65 or systolic goes below 90. This RN reports to providers concern for pts BP continuing to decrease. Pt to remain NPO due to risk of aspiration, therefore PO Midodrine  will not be given. Dr. Lorren at bedside and gave verbal order for this RN to continue 2 mcg of Levo. Jenita, NP contacted this RN via telephone and gave verbal orders to discontinue Levophed  unless mentioned above parameters are met. Reports that she will be admitting pt to [facility] unit unless levophed  is needed. Pt appears more lethargic then previously during her visit. This RN noted also that pts abdomen is extremely distended pt states my stomach is bigger than usual. Pt bladder scanned at this time with in bladder. Pt has female purewick in place and made aware that she can urinate anytime. Dr. Lorren reports that she will order a KUB.

## 2024-06-10 NOTE — Progress Notes (Signed)
 The patient is admitted from ED to 2A 39 AB. She is  A & O x 4. She denies any acute pain. The patient is oriented to her room, staff, ascom and call bell. Full assessment to epic completed. Will continue to monitor.

## 2024-06-10 NOTE — ED Notes (Signed)
 Patient ambulated to toilet with 2x assistance.

## 2024-06-10 NOTE — Progress Notes (Signed)
   BRIEF PCCM NOTE  Pt was seen earlier by [facility] provider [place].  Please see their H&P with attestation by Dr. Malka for full assessment & plan.  BRIEF PT DESCRIPTION / SYNOPSIS :  89 y.o. female, DNR/DNI, admitted with syncope and hypotension suspect secondary to hypovolemia in the setting of AKI on CKD and suspected aspiration event briefly requiring vasopressor support.   SUBJECTIVE / INTERVAL HISTORY :  -This morning is awake, alert, and oriented ~ passed speech evaluation  -Hemodynamically stable, Levophed  weaned off since around midnight, SBP currently 110-120's -Lactic acid is normal  -Renal function is improving  -Will deescalate Zosyn  to Ceftriaxone  pending cultures -Echocardiogram is pending  OBJECTIVE :   Today's Vitals   06/10/24 0844 06/10/24 0900 06/10/24 0930 06/10/24 0945  BP:  (!) 110/48 126/75 132/66  Pulse:  68 69 73  Resp:  13 16 15   Temp: 97.7 F (36.5 C)     TempSrc: Oral     SpO2:  96% 95% 98%  Weight:      Height:      PainSc:       Body mass index is 28.59 kg/m.   ASSESSMENT / PLAN :   #Hypotension: Suspect Hypovolemia ~ RESOLVED  #Syncope PMHx: HFpEF, CAD -Continuous cardiac monitoring -Maintain SBP >90 (as long as pt is asymptomatic, ok with SBP >90 even if MAP not >65 given low DBP) -Gentle IV fluids -Vasopressors as needed to maintain MAP goal ~ WEANED OFF since around midnight 9/24 -Lactic acid has normalized -Trend HS Troponin until peaked -Echocardiogram pending  #Acute Hypoxic Respiratory Failure due to ... #Concern for Aspiration Pneumonia vs Pneumonitis  -Supplemental O2 as needed to maintain O2 sats >92% -Follow intermittent Chest X-ray & ABG as needed -Bronchodilators prn -ABX as above -Pulmonary toilet as able  #Questionable aspiration pneumonia -Monitor fever curve -Trend WBC's ~ Procalcitonin negative x2 -Follow cultures as above -Continue empiric Ceftriaxone  pending cultures & sensitivities (plan  to complete 3 days of ABX)  #Acute Kidney Injury superimposed on CKD Stage IV ~ IMPROVING  Baseline Cr: 1.7-2.05, Cr on admission: 2.42  -Monitor I&O's / urinary output -Follow BMP -Ensure adequate renal perfusion -Avoid nephrotoxic agents as able -Replace electrolytes as indicated ~ Pharmacy following for assistance with electrolyte replacement -Gentle IV fluids          Linh Hedberg, AGACNP-BC [facility] Prefer epic messenger for cross cover needs If after hours, please call E-link

## 2024-06-10 NOTE — Progress Notes (Signed)
 Pt and her daughter updated at bedside on plan of care.  All questions answered.    Inge Lecher, AGACNP-BC [facility] Prefer epic messenger for cross cover needs If after hours, please call E-link

## 2024-06-10 NOTE — Consult Note (Signed)
 NAME:  Cheyenne [name], MRN:  [id number], DOB:  07-15-1928, LOS: 1 ADMISSION DATE:  06/09/2024, CONSULTATION DATE:  06/10/24 REFERRING MD:  Dr. Lorren, CHIEF COMPLAINT:  Syncope  History of Present Illness:  89 yo F presenting to [facility] ED from home via EMS for evaluation after a syncopal episode.  History obtained per chart review and patient bedside report. Patient was in her normal state of health until she had an syncopal episode and her neighbor was unable to wake her calling EMS. She lives with her son and was found by the neighbor slumped over in a chair, possibly appearing as if this had happened mid meal. She denies any current complaints except fatigue and some dizziness this evening, but she cannot recall what happened this evening at home. She denied chest pain, dyspnea, nausea/ vomiting/ diarrhea, fever/chills, edema/weight gain or blurred vision. She has had a chronic cough for over 2 weeks. She had PFT's done outpatient which were showed moderate decrease of diffusion capacity, seen in anemia parenchymal desease, extrathoracic restriction and pulmonary hypertension. Patient had refused RHC and pulmonary hypertension medications if indicated when discussed recently with PCP. She was also recently treated for a UTI, and received a flu shot earlier on 9/23. When asked how her eating and drinking has been she reports she tries to drink enough but could probably drink more.  EMS placed the patient on 4 L [place] due to hypoxia and administered 500 mL bolus.  ED course: Upon arrival patient alert and responsive, hypothermic but otherwise normal vitals. Labs significant for AKI on CKD, chronic anemia and thrombocytopenia. Imaging reveals possible aspiration vs chronic fibrosis. TRH consulted for admission, while awaiting bed placement hemodynamics softened and concern for the need for vasopressor administration. PCCM consulted for admission. Medications given: NS bolus 500 mL & Unasyn  Initial  Vitals: 95.3, 19, 66, 120/82 & 100% on 4 L [place] Significant labs: (Labs/ Imaging personally reviewed) I, Jenita Ruth Rust-Chester, AGACNP-BC, personally viewed and interpreted this ECG. EKG Interpretation: Date: 06/09/24, EKG Time: 20:42, Rate: 69, Rhythm: NSR, QRS Axis:  LAD, Intervals: 1st degree HB & LBBB, ST/T Wave abnormalities: none, Narrative Interpretation: NSR with 1st degree HB and chronic LBBB Chemistry: Na+: 136, K+: 4.7, BUN/Cr.: 50/2.42, Serum CO2/ AG: 22/12 Hematology: WBC: 6.5, Hgb: 10.4, platelet: 147 Troponin: 10 > 11, BNP: 55.2, Lactic/ PCT: 1.6 > 1.4/ <0.10, COVID-19 & Influenza A/B: negative  CXR 06/09/24: no acute cardiopulmonary process. CT head wo contrast 06/09/24: atrophy, chronic microvascular disease. No acute intracranial abnormality. CT chest wo contrast 06/09/24: cardiac enlargement, no pericardial effusions. Aortic atherosclerosis. Scarring and postinflammatory calcifications in the lung apices and bases. Suggestion of superimposed alveolar infiltration and mucous plugging possibly representing inflammatory process or aspiration.  PCCM consulted due to hypotension suspect secondary to hypovolemia in the setting of AKI on CKD and suspected aspiration event and potential need for vasopressor support.  Pertinent  Medical History  Atrial Fibrillation Anemia Multiple Myeloma Depression Depression & Asthma CAD LBBB HTN HLD  Significant Hospital Events: Including procedures, antibiotic start and stop dates in addition to other pertinent events   06/10/24: Admit to SDU due to hypotension suspect secondary to hypovolemia in the setting of AKI on CKD and suspected aspiration event and potential need for vasopressor support.  Interim History / Subjective:  Peripheral levophed  started however at that time the patient's SBP > 90 and she was asymptomatic. She is able to tell me she is fatigued, but is alert and appropriate during our conversation.  No other complaints. Lactic  WNL. - IVF continued. However, as patient is asymptomatic and levophed  has improved SBP into the 110's but not the MAP will stop levophed  for now - consider restarting if SBP drops below 90 - discussed with care RN who is in agreement  Objective    Blood pressure (!) 117/43, pulse 66, temperature 98.3 F (36.8 C), temperature source Oral, resp. rate 13, height 5' (1.524 m), weight 66.4 kg, SpO2 98%.       No intake or output data in the 24 hours ending 06/10/24 0129 Filed Weights   06/09/24 2040  Weight: 66.4 kg    Examination: General: Adult female, acutely ill, lying in bed, NAD HEENT: MM pink/moist, anicteric, atraumatic, neck supple Neuro: A&O x 4, following commands, PERRL +3, MAE CV: s1s2 RRR, NSR with LBBB on monitor, no r/m/g Pulm: Regular, non labored on 2 L [place], breath sounds coarse-BUL & diminished-BLL GI: soft, rounded, slightly distended, non tender, bs x 4 Skin: no rashes/lesions noted Extremities: warm/dry, pulses + 2 R/P, no edema noted  Resolved problem list   Assessment and Plan  Hypotension suspect secondary to hypovolemia  Syncope PMHx: CAD, HFpEF Lactic WNL x 2, BNP normal, low suspicion for cardiogenic or sepsis. With correlating AKI suspect some dehydration. After additional IVF resuscitation SBP consistently > 90 while DBP and MAP vary. - Levophed  PRN, wean as tolerated to maintain SBP > 90 OR MAP > 65 - Alert provider if patient's mentation changes, or she becomes tachycardic - continue NS @ 100 mL/h - hold lisinopril , metoprolol  and torsemide  at this time. Consider restarting as tolerated once patient stabilizes - echocardiogram ordered - falls precautions - consider Midodrine  PO once patient's swallowing evaluated by speech  Suspected Aspiration Acute Hypoxic respiratory failure - resolved - SLP eval - bronchodilators PRN, consider steroids PRN - continue Zosyn  - supplemental O2 PRN to maintain SpO2 > 90% - Intermittent chest x-ray & ABG  PRN - Ensure adequate pulmonary hygiene  - F/u cultures, trend PCT  Acute Kidney Injury superimposed on CKD stage IV Baseline Cr: 1.7-2.05, Cr on admission: 2.42 - Strict I/O's: alert provider if UOP < 0.5 mL/kg/hr - gentle IVF hydration  - Daily BMP, replace electrolytes PRN - Avoid nephrotoxic agents as able, ensure adequate renal perfusion - consider renal US  if renal function continues to worsen  Labs   CBC: Recent Labs  Lab 06/09/24 2100  WBC 6.5  NEUTROABS 3.3  HGB 10.4*  HCT 32.2*  MCV 98.8  PLT 147*    Basic Metabolic Panel: Recent Labs  Lab 06/09/24 2100 06/09/24 2310  NA 136  --   K 4.7  --   CL 102  --   CO2 22  --   GLUCOSE 121*  --   BUN 50*  --   CREATININE 2.42* 2.46*  CALCIUM 8.5*  --    GFR: Estimated Creatinine Clearance: 12.2 mL/min (A) (by C-G formula based on SCr of 2.46 mg/dL (H)). Recent Labs  Lab 06/09/24 2100 06/09/24 2310 06/10/24 0038  PROCALCITON  --  <0.10  --   WBC 6.5  --   --   LATICACIDVEN 1.6  --  1.4    Liver Function Tests: Recent Labs  Lab 06/09/24 2100  AST 19  ALT 12  ALKPHOS 50  BILITOT 0.7  PROT 6.7  ALBUMIN 3.5   No results for input(s): LIPASE, AMYLASE in the last 168 hours. No results for input(s): AMMONIA in the last 168 hours.  ABG No results found for: PHART, PCO2ART, PO2ART, HCO3, TCO2, ACIDBASEDEF, O2SAT   Coagulation Profile: No results for input(s): INR, PROTIME in the last 168 hours.  Cardiac Enzymes: No results for input(s): CKTOTAL, CKMB, CKMBINDEX, TROPONINI in the last 168 hours.  HbA1C: Hgb A1c MFr Bld  Date/Time Value Ref Range Status  02/14/2022 06:28 AM 5.9 (H) 4.8 - 5.6 % Final    Comment:    (NOTE) Pre diabetes:          5.7%-6.4%  Diabetes:              >6.4%  Glycemic control for   <7.0% adults with diabetes     CBG: No results for input(s): GLUCAP in the last 168 hours.  Review of Systems: Positives in BOLD  Gen: Denies fever,  chills, weight change, fatigue, night sweats HEENT: Denies blurred vision, double vision, hearing loss, tinnitus, sinus congestion, rhinorrhea, sore throat, neck stiffness, dysphagia PULM: Denies shortness of breath, cough, sputum production, hemoptysis, wheezing CV: Denies chest pain, edema, orthopnea, paroxysmal nocturnal dyspnea, palpitations GI: Denies abdominal pain, nausea, vomiting, diarrhea, hematochezia, melena, constipation, change in bowel habits GU: Denies dysuria, hematuria, polyuria, oliguria, urethral discharge Endocrine: Denies hot or cold intolerance, polyuria, polyphagia or appetite change Derm: Denies rash, dry skin, scaling or peeling skin change Heme: Denies easy bruising, bleeding, bleeding gums Neuro: Denies headache, numbness, weakness, slurred speech, loss of memory or consciousness  Past Medical History:  She,  has a past medical history of A-fib (HCC), Allergic rhinitis, Anemia, Anxiety, Arthritis, Asthma, Breast cancer, right (HCC) (1995), CAD (coronary artery disease), Carpal tunnel syndrome, Cervical radiculopathy due to degenerative joint disease of spine, Chronic kidney disease (CKD), stage IV (severe) (HCC), Chronic use of opiate for therapeutic purpose, Depression, Diverticulitis, Diverticulosis, DOE (dyspnea on exertion), Followed by palliative care service, GERD (gastroesophageal reflux disease), H. pylori infection (06/2005), H/O bilateral cataract extraction (12/2005), HFrEF (heart failure with reduced ejection fraction) (HCC) (09/14/2021), History of spinal fracture (1990), HLD (hyperlipidemia), HTN (hypertension), Impaired glucose tolerance, LBBB (left bundle branch block), Long term current use of anticoagulant, Long term current use of antithrombotics/antiplatelets, Lumbago, Lumbar radiculopathy, Macular degeneration, NSTEMI (non-ST elevated myocardial infarction) (HCC) (09/13/2021), Osteoporosis, Ovarian cyst, Paresthesia of both hands, Peripheral edema, PVD  (peripheral vascular disease), and Vitamin D deficiency.   Surgical History:   Past Surgical History:  Procedure Laterality Date   ABDOMINAL HYSTERECTOMY  1972   CARPAL TUNNEL RELEASE Right 08/10/2019   CATARACT EXTRACTION Bilateral 2007   COLONOSCOPY     CORONARY ANGIOPLASTY WITH STENT PLACEMENT Left 09/15/2021   Procedure: CORONARY ANGIOPLASTY WITH STENT PLACEMENT; Location: [facility]; Surgeon: Paticia Sharp, MD   CORONARY ANGIOPLASTY WITH STENT PLACEMENT Left 09/19/2021   Procedure: CORONARY ANGIOPLASTY WITH STENT PLACEMENT; Location: [facility]; Surgeon: Lynnie Muta, MD   IR BONE MARROW BIOPSY & ASPIRATION  05/13/2023   LUMBAR LAMINECTOMY/DECOMPRESSION MICRODISCECTOMY Left 07/25/2022   Procedure: LEFT L4-5 LAMINOFORAMINOTOMY;  Surgeon: Clois Fret, MD;  Location: [facility];  Service: Neurosurgery;  Laterality: Left;   MASTECTOMY Right 1999   SIGMOIDOSCOPY  2004   UPPER GASTROINTESTINAL ENDOSCOPY  2006     Social History:   reports that she has never smoked. She has never used smokeless tobacco. She reports that she does not drink alcohol  and does not use drugs.   Family History:  Her family history includes Multiple myeloma in her daughter.   Allergies Allergies  Allergen Reactions   Alendronate     Other reaction(s): Abdominal Pain, Other (See Comments),  Other (See Comments) abd pain abd pain abd pain    Levofloxacin     Other reaction(s): Muscle Pain   Codeine Nausea Only   Propoxyphene Nausea Only     Home Medications  Prior to Admission medications   Medication Sig Start Date End Date Taking? Authorizing Provider  acetaminophen  (TYLENOL ) 500 MG tablet Take 1,000 mg by mouth every 6 (six) hours as needed.   Yes [provider]  Azelastine-Fluticasone 137-50 MCG/ACT SUSP Place 1 spray into the nose in the morning and at bedtime. 12/03/23  Yes [provider]  Cholecalciferol (VITAMIN D3 PO) Take by mouth daily.   Yes [provider]   clopidogrel  (PLAVIX ) 75 MG tablet Take 75 mg by mouth daily. 01/16/22  Yes [provider]  cyanocobalamin  1000 MCG tablet TAKE 1 TABLET EVERY DAY FOR VITAMIN B12 DEFICIENCY 11/16/21  Yes [provider]  fexofenadine (ALLEGRA) 180 MG tablet Take 180 mg by mouth daily.   Yes [provider]  levalbuterol (XOPENEX HFA) 45 MCG/ACT inhaler Inhale 2 puffs into the lungs every 8 (eight) hours as needed. 04/22/24 04/22/25 Yes [provider]  lidocaine  (LIDODERM ) 5 % Place 1-2 patches onto the skin. 05/16/22  Yes [provider]  lisinopril  (ZESTRIL ) 2.5 MG tablet Take 2.5 mg by mouth daily. 01/16/22  Yes [provider]  metoprolol  succinate (TOPROL -XL) 25 MG 24 hr tablet Take 25 mg by mouth daily. 01/16/22  Yes [provider]  Multiple Vitamins-Minerals (PRESERVISION AREDS 2) CAPS Take 1 capsule by mouth daily. 06/05/23  Yes [provider]  omeprazole (PRILOSEC) 40 MG capsule Take 40 mg by mouth daily. 06/05/23  Yes [provider]  pregabalin  (LYRICA ) 150 MG capsule Take 150 mg by mouth. 12/16/23  Yes [provider]  torsemide  (DEMADEX ) 20 MG tablet Take 20 mg by mouth daily. 01/29/22  Yes [provider]  fluticasone (FLONASE) 50 MCG/ACT nasal spray Place 1 spray into both nostrils daily. 06/05/23 06/04/24  [provider]  ipratropium (ATROVENT ) 0.06 % nasal spray Place 2 sprays into the nose 3 (three) times daily. 06/05/23 06/04/24  [provider]  naloxone TAMMY) nasal spray 4 mg/0.1 mL SMARTSIG:Both Nares Patient not taking: Reported on 03/19/2024 02/08/22   [provider]  nitroGLYCERIN  (NITROSTAT ) 0.4 MG SL tablet  01/25/22   [provider]     Critical care time: 60 minutes        Jenita Jama Meek, AGACNP-BC Acute Care Nurse Practitioner [facility]   [phone] / [phone] Please see Amion for details.

## 2024-06-10 NOTE — Evaluation (Signed)
 Clinical/Bedside Swallow Evaluation Patient Details  Name: Cheyenne [name] MRN: [id number] Date of Birth: 1928/06/11  Today's Date: 06/10/2024 Time: SLP Start Time (ACUTE ONLY): 9164 SLP Stop Time (ACUTE ONLY): 0920 SLP Time Calculation (min) (ACUTE ONLY): 45 min  Past Medical History:  Past Medical History:  Diagnosis Date   A-fib (HCC)    a.) CHA2DS2VASc = 6 (age x 2, sex, HFrEF, HTN, NSTEMI);  b.) rate/rhythm maintained on oral metoprolol  succinate; chronically anticoagulated with apixaban  + clopidogrel    Allergic rhinitis    Anemia    Anxiety    Arthritis    Asthma    Breast cancer, right (HCC) 1995   a.) s/p surgical resection (mastectomy) + chemotherapy + 5 years endocrine therapy (tamoxifen)   CAD (coronary artery disease)    a.) LHC 09/15/2021: 90% mLAD, 70% D1, 99% pLCx, 95% mRCA.   Carpal tunnel syndrome    Cervical radiculopathy due to degenerative joint disease of spine    Chronic kidney disease (CKD), stage IV (severe) (HCC)    Chronic use of opiate for therapeutic purpose    a.) has naloxone Rx in place   Depression    Diverticulitis    Diverticulosis    DOE (dyspnea on exertion)    Followed by palliative care service    GERD (gastroesophageal reflux disease)    H. pylori infection 06/2005   H/O bilateral cataract extraction 12/2005   HFrEF (heart failure with reduced ejection fraction) (HCC) 09/14/2021   a.) newly Dx'd. EF 35%, glob HK, sep/inf/post AK, mild LAE, triv AR/PR, mild MR/TR, GLS -9.1%, G2DD; b.) TTE 02/14/2022: EF 60-65%, mild-mod MR, G1DD   History of spinal fracture 1990   a.) T7 and T12 fractures - traumatic sequela of a mechanical fall   HLD (hyperlipidemia)    HTN (hypertension)    Impaired glucose tolerance    LBBB (left bundle branch block)    Long term current use of anticoagulant    a.) dose reduced apixaban    Long term current use of antithrombotics/antiplatelets    a.) clopidogrel    Lumbago    Lumbar radiculopathy    Macular  degeneration    NSTEMI (non-ST elevated myocardial infarction) (HCC) 09/13/2021   a.) LHC/PCI 09/15/2021: 90% mLAD (plans for staged PCI), 70% D1, 99% pLCx (2.75 x 30 mm Resolute Onyx Frontier DES), 95% mRCA (3.5 x 15 mm Resolute Onyx Frontier DES); b.) staged PCI 09/19/2021 - PTCA/DES x1 (unknown type) to mLAD   Osteoporosis    Ovarian cyst    Paresthesia of both hands    Peripheral edema    PVD (peripheral vascular disease)    Vitamin D deficiency    Past Surgical History:  Past Surgical History:  Procedure Laterality Date   ABDOMINAL HYSTERECTOMY  1972   CARPAL TUNNEL RELEASE Right 08/10/2019   CATARACT EXTRACTION Bilateral 2007   COLONOSCOPY     CORONARY ANGIOPLASTY WITH STENT PLACEMENT Left 09/15/2021   Procedure: CORONARY ANGIOPLASTY WITH STENT PLACEMENT; Location: [facility]; Surgeon: Paticia Sharp, MD   CORONARY ANGIOPLASTY WITH STENT PLACEMENT Left 09/19/2021   Procedure: CORONARY ANGIOPLASTY WITH STENT PLACEMENT; Location: [facility]; Surgeon: Lynnie Muta, MD   IR BONE MARROW BIOPSY & ASPIRATION  05/13/2023   LUMBAR LAMINECTOMY/DECOMPRESSION MICRODISCECTOMY Left 07/25/2022   Procedure: LEFT L4-5 LAMINOFORAMINOTOMY;  Surgeon: Clois Fret, MD;  Location: [facility];  Service: Neurosurgery;  Laterality: Left;   MASTECTOMY Right 1999   SIGMOIDOSCOPY  2004   UPPER GASTROINTESTINAL ENDOSCOPY  2006   HPI:  Pt is a  89 y.o. female with medical history significant of multiple medical issues including GERD, atrial fibrillation, macular degeneration, anemia,  multiple myeloma, depression, asthma, depression, CAD, left bundle branch block, hypertension and hyperlipidemia. The patient presents to the hospital with chief complaint of syncope.      According to the patient, she was sitting at the dinner table and eating supper when she suddenly passed out. According to her daughter, the patient's  neighbors went to the house to take her something to eat and they found the patient slumped in a  chair and was not responding at all. She felt very cold.  BP was 83/44 and pt was hypothermic in the ED. Pt placed on baer hugger in the ED for temp control.  Pt was admitted for hypoxia, syncope. WBC WNL.   Chest CT: 1. Cardiac enlargement.  No pericardial effusions.  2. Aortic atherosclerosis.  3. Scarring and postinflammatory calcifications in the lung apices  and bases.  4. Suggestion of superimposed alveolar infiltration and mucous  plugging.  Head CT: Atrophy, chronic microvascular disease.     No acute intracranial abnormality.    Assessment / Plan / Recommendation  Clinical Impression   Pt seen for BSE this morning. Pt awake, A/O x3-4. She was Pleasant; verbal and answered all questions, followed commands appropriately. She fed herself w/ setup support in bed. Pt has macular degeneration dx per chart.  On [place] 4L since admit; WBC WNL. Afebrile s/p hypothermic at admit.  Pt appears to present w/ functional oropharyngeal phase swallow w/ No oropharyngeal phase dysphagia noted, No neuromuscular deficits noted. Pt consumed po trials w/ No overt, clinical s/s of aspiration during the po trials.  Pt appears at reduced risk for aspiration following general aspiration precautions. However, pt does have challenging factors that could impact oropharyngeal swallowing to include GERD(ANY Esophageal phase Dysmotility or Regurgitation of Reflux material can increase risk for aspiration of the Reflux material during Retrograde flow thus impact Pulmonary status), deconditioning/weakness w/ hospitalization/illness, and advanced Age. These factors can increase risk for dysphagia as well as decreased oral intake overall.   During po trials, pt consumed all consistencies w/ no overt coughing, decline in vocal quality, nor change in respiratory presentation during/post trials. O2 sats remained 99%. Oral phase appeared [facility] w/ timely bolus management, mastication, and control of bolus propulsion for A-P transfer for  swallowing. Oral clearing achieved w/ all trial consistencies -- moistened, soft foods given. OM Exam appeared [facility] w/ no unilateral weakness noted. Speech Clear.   Recommend a Regular consistency diet w/ well-Cut meats, moistened foods; Thin liquids -- monitor straw use and discontinue use if increased coughing noted w/ use. Cup drinking w/ pt Holding Cup when drinking. Recommend general aspiration precautions including Small bites/sips Slowly; sitting Upright for all oral intake. General REFLUX precautions in setting of GERD. Tray setup and sitting up at meals. Pills 1 at a time w/ Water vs WHOLE in Puree for easier swallowing if desired.   Education given on Pills in Puree; food consistencies and easy to eat options; general aspiration precautions to pt. MD/NSG to reconsult if any new needs arise during admit. MD/NSG updated, agreed. Recommend Dietician f/u for support if indicated. SLP Visit Diagnosis: Dysphagia, unspecified (R13.10) (baseline GERD; advanced Age)    Aspiration Risk   (reduced following general aspiration precautions)    Diet Recommendation   Thin;Age appropriate regular (cut, moistened foods) = a Regular consistency diet w/ well-Cut meats, moistened foods; Thin liquids -- monitor straw use and discontinue  use if increased coughing noted w/ use. Cup drinking w/ pt Holding Cup when drinking. Recommend general aspiration precautions including Small bites/sips Slowly; sitting Upright for all oral intake. General REFLUX precautions in setting of GERD. Tray setup and sitting up at meals.   Medication Administration: Whole meds with puree (vs w/ water IF needed for ease/comfort w/ Larger Pills)    Other  Recommendations Recommended Consults:  (tbd by NP) Oral Care Recommendations: Oral care BID;Patient independent with oral care (setup)     Assistance Recommended at Discharge  Intermittent during admit  Functional Status Assessment Patient has not had a recent decline in their  functional status  Frequency and Duration  (n/a)   (n/a)       Prognosis Prognosis for improved oropharyngeal function: Good Barriers to Reach Goals:  (advanced age) Barriers/Prognosis Comment: baseline GERD; advanced Age      Swallow Study   General Date of Onset: 06/09/24 HPI: Pt is a 89 y.o. female with medical history significant of multiple medical issues including GERD, atrial fibrillation, macular degeneration, anemia,  multiple myeloma, depression, asthma, depression, CAD, left bundle branch block, hypertension and hyperlipidemia. The patient presents to the hospital with chief complaint of syncope.      According to the patient, she was sitting at the dinner table and eating supper when she suddenly passed out. According to her daughter, the patient's  neighbors went to the house to take her something to eat and they found the patient slumped in a chair and was not responding at all. She felt very cold.  BP was 83/44 and pt was hypothermic in the ED. Pt placed on baer hugger in the ED for temp control.  Pt was admitted for hypoxia, syncope. WBC WNL.   Chest CT: 1. Cardiac enlargement.  No pericardial effusions.  2. Aortic atherosclerosis.  3. Scarring and postinflammatory calcifications in the lung apices  and bases.  4. Suggestion of superimposed alveolar infiltration and mucous  plugging.  Head CT: Atrophy, chronic microvascular disease.     No acute intracranial abnormality. Type of Study: Bedside Swallow Evaluation Previous Swallow Assessment: none Diet Prior to this Study: NPO (regular diet at home) Temperature Spikes Noted: No (wbc 4.9) Respiratory Status: Nasal cannula (4L since admit) History of Recent Intubation: No Behavior/Cognition: Alert;Cooperative;Pleasant mood Oral Cavity Assessment: Within Functional Limits (min dry) Oral Care Completed by SLP: Yes Oral Cavity - Dentition: Adequate natural dentition Vision: Functional for self-feeding Self-Feeding Abilities: Able  to feed self;Needs assist;Needs set up Patient Positioning: Upright in bed (supported) Baseline Vocal Quality: Normal Volitional Cough: Strong Volitional Swallow: Able to elicit    Oral/Motor/Sensory Function Overall Oral Motor/Sensory Function: Within functional limits   Ice Chips Ice chips: Within functional limits Presentation: Spoon (fed; 3 trials)   Thin Liquid Thin Liquid: Within functional limits Presentation: Cup;Self Fed (10+ trials)    Nectar Thick Nectar Thick Liquid: Not tested   Honey Thick Honey Thick Liquid: Not tested   Puree Puree: Within functional limits Presentation: Self Fed;Spoon (6+ trials)   Solid     Solid: Within functional limits Presentation: Self Fed;Spoon (5+ trials)        Comer Portugal, MS, CCC-SLP Speech Language Pathologist Rehab Services; [facility] [phone] (ascom) Laroy Mustard 06/10/2024,9:37 AM

## 2024-06-10 NOTE — ED Notes (Addendum)
 SABRA

## 2024-06-10 NOTE — Progress Notes (Signed)
  Echocardiogram 2D Echocardiogram has been performed.  Cheyenne [name] 06/10/2024, 11:09 AM

## 2024-06-10 NOTE — ED Notes (Signed)
 Patient assisted to use bedside commode. Patient had a small bowel movement. Patient was cleaned up and returned to bed. Bed in lowest position with call light in reach. Family at bedside.

## 2024-06-10 NOTE — ED Notes (Signed)
 Fall risk bundle in place.

## 2024-06-10 NOTE — ED Notes (Signed)
 Patient sat up to eat breakfast.

## 2024-06-11 ENCOUNTER — Encounter: Payer: Self-pay | Admitting: Internal Medicine

## 2024-06-11 ENCOUNTER — Other Ambulatory Visit: Payer: Self-pay

## 2024-06-11 LAB — CBC
HCT: 27.1 % — ABNORMAL LOW (ref 36.0–46.0)
Hemoglobin: 9 g/dL — ABNORMAL LOW (ref 12.0–15.0)
MCH: 31.8 pg (ref 26.0–34.0)
MCHC: 33.2 g/dL (ref 30.0–36.0)
MCV: 95.8 fL (ref 80.0–100.0)
Platelets: 96 K/uL — ABNORMAL LOW (ref 150–400)
RBC: 2.83 MIL/uL — ABNORMAL LOW (ref 3.87–5.11)
RDW: 14.2 % (ref 11.5–15.5)
WBC: 4.9 K/uL (ref 4.0–10.5)
nRBC: 0 % (ref 0.0–0.2)

## 2024-06-11 LAB — RENAL FUNCTION PANEL
Albumin: 3.1 g/dL — ABNORMAL LOW (ref 3.5–5.0)
Anion gap: 10 (ref 5–15)
BUN: 39 mg/dL — ABNORMAL HIGH (ref 8–23)
CO2: 19 mmol/L — ABNORMAL LOW (ref 22–32)
Calcium: 8.4 mg/dL — ABNORMAL LOW (ref 8.9–10.3)
Chloride: 110 mmol/L (ref 98–111)
Creatinine, Ser: 1.62 mg/dL — ABNORMAL HIGH (ref 0.44–1.00)
GFR, Estimated: 29 mL/min — ABNORMAL LOW (ref >60)
Glucose, Bld: 134 mg/dL — ABNORMAL HIGH (ref 70–99)
Phosphorus: 3.4 mg/dL (ref 2.5–4.6)
Potassium: 4.7 mmol/L (ref 3.5–5.1)
Sodium: 139 mmol/L (ref 135–145)

## 2024-06-11 LAB — LEGIONELLA PNEUMOPHILA SEROGP 1 UR AG: L. pneumophila Serogp 1 Ur Ag: NEGATIVE

## 2024-06-11 LAB — MAGNESIUM: Magnesium: 2 mg/dL (ref 1.7–2.4)

## 2024-06-11 MED ORDER — AMOXICILLIN-POT CLAVULANATE 500-125 MG PO TABS
1.0000 | ORAL_TABLET | Freq: Two times a day (BID) | ORAL | 0 refills | Status: AC
  Filled 2024-06-11: qty 8, 4d supply, fill #0

## 2024-06-11 MED ORDER — LORATADINE 10 MG PO TABS
10.0000 mg | ORAL_TABLET | Freq: Every day | ORAL | Status: DC
Start: 2024-06-11 — End: 2024-06-11
  Administered 2024-06-11: 10 mg via ORAL
  Filled 2024-06-11: qty 1

## 2024-06-11 NOTE — Plan of Care (Signed)

## 2024-06-11 NOTE — Discharge Instructions (Signed)
 Follow-up with primary care Doctor. Complete all your antibiotics even if you feel better before it is finished.

## 2024-06-11 NOTE — Progress Notes (Signed)
 Pt urinated 300 mls post foley removal. Pt given d/c instructions and verbalizes understanding to take all abx and follow up with her MD.

## 2024-06-11 NOTE — Care Management Important Message (Signed)
 Important Message  Patient Details  Name: Cheyenne [name] MRN: [id number] Date of Birth: 26-Sep-1927   Important Message Given:  Yes - Medicare IM     Rojelio SHAUNNA Rattler 06/11/2024, 2:00 PM

## 2024-06-11 NOTE — TOC Transition Note (Addendum)
 Transition of Care [facility]) - Discharge Note   Patient Details  Name: Cheyenne [name] MRN: [id number] Date of Birth: [birth year]  Transition of Care [facility]) CM/SW Contact:  Alfonso Rummer, LCSW Phone Number: 06/11/2024, 2:53 PM   Clinical Narrative:     KEN DELENA Rummer contacted pt daughter Dagoberto Finder via phone regarding dx plans. Home health pt/ot is set up with [facility] services will start Saturday 06/13/2024 . Pt's neighbor Lonell Bellman will transport pt home. [facility] is confirmed they will provide palliative care services to Ms. Sheerin.   Final next level of care: Home/Self Care Barriers to Discharge: Barriers Resolved   Patient Goals and CMS Choice            Discharge Placement                  Name of family member notified: pt daughter Dagoberto Finder   [phone]    advised via phone pt is discharging today Patient and family notified of of transfer: 06/11/24  Discharge Plan and Services Additional resources added to the After Visit Summary for                              [facility] Agency: Other - See comment (Home health pt/ot set up with [facility]) Date [facility] Agency Contacted: 06/11/24 Time HH Agency Contacted: 1452 Representative spoke with at [facility] Agency: [place] 's coverage  Social Drivers of Health (SDOH) Interventions SDOH Screenings   Food Insecurity: No Food Insecurity (06/10/2024)  Housing: Low Risk  (06/10/2024)  Transportation Needs: Unknown (06/10/2024)  Utilities: Not At Risk (06/10/2024)  Depression (PHQ2-9): Low Risk  (05/02/2023)  Financial Resource Strain: Low Risk  (04/21/2024)   Received from [facility]  Social Connections: Unknown (06/11/2024)  Stress: Stress Concern Present (10/29/2017)   Received from [facility]  Tobacco Use: Low Risk  (06/09/2024)     Readmission Risk Interventions     No data to display

## 2024-06-11 NOTE — Progress Notes (Signed)
 [facility] Room 239 [facility] Liaison note  Received a referral today from Andrea Figueroa, LCSW, for outpatient palliative follow up with patient at home.    Referral submitted today.  Please call with any palliative care questions or concerns.  Thank you for the opportunity to participate in this patient's care  [facility] Liaison  [phone]

## 2024-06-11 NOTE — Evaluation (Signed)
 Occupational Therapy Evaluation Patient Details Name: Cheyenne [name] MRN: [id number] DOB: 09/09/1928 Today's Date: 06/11/2024   History of Present Illness   Cheyenne [name] is a 89 y.o. female with medical history significant of atrial fibrillation anemia,  multiple myeloma, depression, asthma, depression, CAD, left bundle branch block, hypertension and hyperlipidemia. The patient presents to the hospital with chief complaint of syncope.     Clinical Impressions Cheyenne [name] was seen for OT/PT co-evaluation this date. Prior to hospital admission, pt was generally independent with ADL management. She reports using a SPC primarily for functional mobility but also has a [id number]. Pt lives with her adult children in a 1 level home with 1 STE. Pt presents with deficits in activity tolerance and strength, affecting safe and optimal ADL completion. Pt currently requires SUPERVISION for safety with functional mobility, toilet transfers, and standing grooming.  Pt would benefit from skilled OT services to address noted impairments and functional limitations (see below for any additional details) in order to maximize safety and independence while minimizing future risk of falls, injury, and readmission. Do not anticipate the need for follow up OT services upon acute hospital DC.      If plan is discharge home, recommend the following:   A little help with walking and/or transfers;Assist for transportation;Assistance with cooking/housework     Functional Status Assessment   Patient has had a recent decline in their functional status and demonstrates the ability to make significant improvements in function in a reasonable and predictable amount of time.     Equipment Recommendations   None recommended by OT     Recommendations for Other Services         Precautions/Restrictions   Precautions Precautions: Fall Recall of Precautions/Restrictions: Intact Restrictions Weight Bearing  Restrictions Per Provider Order: No     Mobility Bed Mobility               General bed mobility comments: NT patient received in recliner and remained in recliner    Transfers Overall transfer level: Needs assistance Equipment used: Rolling walker (2 wheels) Transfers: Sit to/from Stand Sit to Stand: Supervision                  Balance Overall balance assessment: Modified Independent, No apparent balance deficits (not formally assessed)                                         ADL either performed or assessed with clinical judgement   ADL Overall ADL's : Needs assistance/impaired                                       General ADL Comments: SUP/SET UP with RW for transfers, toileting, and functional mobility in room/hall. Anticipate MIN A for LB ADL management from STS.     Vision Baseline Vision/History: 1 Wears glasses Ability to See in Adequate Light: 1 Impaired Patient Visual Report: No change from baseline       Perception         Praxis         Pertinent Vitals/Pain Pain Assessment Pain Assessment: No/denies pain     Extremity/Trunk Assessment Upper Extremity Assessment Upper Extremity Assessment: Overall WFL for tasks assessed   Lower Extremity Assessment Lower Extremity Assessment: Overall WFL for tasks  assessed   Cervical / Trunk Assessment Cervical / Trunk Assessment: Normal   Communication Communication Communication: Impaired Factors Affecting Communication: Hearing impaired   Cognition Arousal: Alert Behavior During Therapy: WFL for tasks assessed/performed               OT - Cognition Comments: Pleasant, conversational, eager to participate in session.                 Following commands: Intact       Cueing  General Comments   Cueing Techniques: Verbal cues  RN in room to alert pt in Afib per tele during functional mobility, HR up to 140's with amb. Care team aware.    Exercises Other Exercises Other Exercises: Pt educated on role of OT in acute setting, safety, falls prevention strategies, and DC recs.   Shoulder Instructions      Home Living Family/patient expects to be discharged to:: Private residence Living Arrangements: Children Available Help at Discharge: Family;Available 24 hours/day Type of Home: House Home Access: Stairs to enter [employer] of Steps: 1   Home Layout: One level     Bathroom Shower/Tub: Chief Strategy Officer: Standard Bathroom Accessibility: Yes   Home Equipment: Rollator (4 wheels);Cane - single point;Shower seat          Prior Functioning/Environment Prior Level of Function : Independent/Modified Independent             Mobility Comments: States she uses cane at baseline, but has a walker ADLs Comments: does not drive, was independent with ADLs per her report    OT Problem List: Decreased coordination;Decreased strength;Decreased range of motion;Decreased activity tolerance;Decreased safety awareness;Impaired balance (sitting and/or standing);Decreased knowledge of use of DME or AE   OT Treatment/Interventions: Self-care/ADL training;Therapeutic exercise;Therapeutic activities;DME and/or AE instruction;Patient/family education;Balance training;Energy conservation      OT Goals(Current goals can be found in the care plan section)   Acute Rehab OT Goals Patient Stated Goal: To go home OT Goal Formulation: With patient Time For Goal Achievement: 06/25/24 Potential to Achieve Goals: Good ADL Goals Pt Will Perform Grooming: sitting;with modified independence;standing Pt Will Perform Lower Body Dressing: sit to/from stand;with modified independence;with adaptive equipment Pt Will Transfer to Toilet: bedside commode;with modified independence;ambulating;regular height toilet Pt Will Perform Toileting - Clothing Manipulation and hygiene: sit to/from stand;with modified  independence;with adaptive equipment   OT Frequency:  Min 2X/week    Co-evaluation PT/OT/SLP Co-Evaluation/Treatment: Yes Reason for Co-Treatment: To address functional/ADL transfers;For patient/therapist safety PT goals addressed during session: Balance;Mobility/safety with mobility;Proper use of DME OT goals addressed during session: Proper use of Adaptive equipment and DME;ADL's and self-care      AM-PAC OT 6 Clicks Daily Activity     Outcome Measure Help from another person eating meals?: None Help from another person taking care of personal grooming?: A Little Help from another person toileting, which includes using toliet, bedpan, or urinal?: A Little Help from another person bathing (including washing, rinsing, drying)?: A Little Help from another person to put on and taking off regular upper body clothing?: A Little Help from another person to put on and taking off regular lower body clothing?: A Little 6 Click Score: 19   End of Session Equipment Utilized During Treatment: Gait belt;Rolling walker (2 wheels) Nurse Communication: Mobility status  Activity Tolerance: Patient tolerated treatment well Patient left: in chair;with call bell/phone within reach;with chair alarm set  OT Visit Diagnosis: Other abnormalities of gait and mobility (R26.89);Muscle weakness (generalized) (M62.81)  Time: 0950-1005 OT Time Calculation (min): 15 min Charges:  OT General Charges $OT Visit: 1 Visit OT Evaluation $OT Eval Low Complexity: 1 Low  Jhonny Pelton, M.S., OTR/L 06/11/24, 12:45 PM

## 2024-06-11 NOTE — Evaluation (Signed)
 Physical Therapy Evaluation Patient Details Name: Cheyenne [name] MRN: [id number] DOB: [birth year] Today's Date: 06/11/2024  History of Present Illness  Cheyenne [name] is a 89 y.o. female with medical history significant of atrial fibrillation anemia,  multiple myeloma, depression, asthma, depression, CAD, left bundle branch block, hypertension and hyperlipidemia. The patient presents to the hospital with chief complaint of syncope.   Clinical Impression  Patient received in recliner. She had just walked to bathroom with RN on arrival. Patient is pleasant and agrees to PT/OT assessment. She lives with daughter, but states her daughter is moving out. She has other family and neighbors nearby. Patient is able to stand with supervision and ambulated 120 feet with RW and supervision. She has no LOB. Appears to be close to her baseline. She did have HR increase and Afib while ambulating therefore will continue to benefit from skilled PT to improve safety and independence.        If plan is discharge home, recommend the following: A little help with walking and/or transfers;A little help with bathing/dressing/bathroom   Can travel by private vehicle    yes    Equipment Recommendations None recommended by PT  Recommendations for Other Services       Functional Status Assessment Patient has had a recent decline in their functional status and demonstrates the ability to make significant improvements in function in a reasonable and predictable amount of time.     Precautions / Restrictions Precautions Precautions: Fall Recall of Precautions/Restrictions: Intact Restrictions Weight Bearing Restrictions Per Provider Order: No      Mobility  Bed Mobility               General bed mobility comments: NT patient received in recliner and remained in recliner    Transfers Overall transfer level: Needs assistance Equipment used: Rolling walker (2 wheels) Transfers: Sit to/from  Stand Sit to Stand: Supervision                Ambulation/Gait Ambulation/Gait assistance: Supervision Gait Distance (Feet): 120 Feet Assistive device: Rolling walker (2 wheels) Gait Pattern/deviations: Step-through pattern, Decreased step length - right, Decreased step length - left, Decreased stride length Gait velocity: decr     General Gait Details: patient ambulating well with RW, RN reports HR increased to 140s with ambulation.  Stairs            Wheelchair Mobility     Tilt Bed    Modified Rankin (Stroke Patients Only)       Balance Overall balance assessment: Modified Independent                                           Pertinent Vitals/Pain Pain Assessment Pain Assessment: No/denies pain    Home Living Family/patient expects to be discharged to:: Private residence Living Arrangements: Children Available Help at Discharge: Family;Available 24 hours/day Type of Home: House Home Access: Stairs to enter   [employer] of Steps: 1   Home Layout: One level Home Equipment: Rollator (4 wheels);Cane - single point;Shower seat      Prior Function Prior Level of Function : Independent/Modified Independent             Mobility Comments: States she uses cane at baseline, but has a walker ADLs Comments: does not drive, was independent with ADLs per her report     Extremity/Trunk Assessment   Upper  Extremity Assessment Upper Extremity Assessment: Defer to OT evaluation    Lower Extremity Assessment Lower Extremity Assessment: Overall WFL for tasks assessed    Cervical / Trunk Assessment Cervical / Trunk Assessment: Normal  Communication   Communication Communication: Impaired Factors Affecting Communication: Hearing impaired    Cognition Arousal: Alert Behavior During Therapy: WFL for tasks assessed/performed   PT - Cognitive impairments: No apparent impairments                          Following commands: Intact       Cueing Cueing Techniques: Verbal cues     General Comments      Exercises     Assessment/Plan    PT Assessment Patient needs continued PT services  PT Problem List Decreased mobility;Decreased balance;Decreased strength;Cardiopulmonary status limiting activity       PT Treatment Interventions DME instruction;Gait training;Stair training;Functional mobility training;Therapeutic activities;Therapeutic exercise;Patient/family education    PT Goals (Current goals can be found in the Care Plan section)  Acute Rehab PT Goals Patient Stated Goal: return home PT Goal Formulation: With patient Time For Goal Achievement: 06/16/24 Potential to Achieve Goals: Good    Frequency Min 2X/week     Co-evaluation               AM-PAC PT 6 Clicks Mobility  Outcome Measure Help needed turning from your back to your side while in a flat bed without using bedrails?: A Little Help needed moving from lying on your back to sitting on the side of a flat bed without using bedrails?: A Little Help needed moving to and from a bed to a chair (including a wheelchair)?: A Little Help needed standing up from a chair using your arms (e.g., wheelchair or bedside chair)?: A Little Help needed to walk in hospital room?: A Little Help needed climbing 3-5 steps with a railing? : A Little 6 Click Score: 18    End of Session   Activity Tolerance: Patient tolerated treatment well Patient left: in chair;with call bell/phone within reach;with chair alarm set Nurse Communication: Mobility status PT Visit Diagnosis: Other abnormalities of gait and mobility (R26.89);Muscle weakness (generalized) (M62.81);Difficulty in walking, not elsewhere classified (R26.2);Unsteadiness on feet (R26.81)    Time: 0950-1004 PT Time Calculation (min) (ACUTE ONLY): 14 min   Charges:   PT Evaluation $PT Eval Low Complexity: 1 Low   PT General Charges $$ ACUTE PT VISIT: 1 Visit          Shakai Dolley, PT, GCS 06/11/24,11:03 AM

## 2024-06-12 NOTE — Discharge Summary (Signed)
 Physician Discharge Summary   Patient: Cheyenne [name] MRN: [id number] DOB: December 20, 1927  Admit date:     06/09/2024  Discharge date: 06/11/2024  Discharge Physician: Cresencio Fairly   PCP: Steva Clotilda DEL, NP   Recommendations at discharge:    F/up with outpt providers as requested  Discharge Diagnoses: Principal Problem:   Syncope Active Problems:   Acute respiratory failure with hypoxia (HCC)   Sepsis (HCC)   Hypothermia   Paroxysmal atrial fibrillation (HCC)   Prolonged QT interval   Chronic kidney disease, stage 3b (HCC)   Acute renal failure superimposed on stage 3b chronic kidney disease (HCC)   Shock (HCC)   Aspiration into airway, initial encounter  Hospital Course: Assessment and Plan:  89 y.o. female, DNR/DNI, admitted with syncope and hypotension suspect secondary to hypovolemia in the setting of AKI on CKD and suspected aspiration event briefly requiring vasopressor support so was admitted under PCCM for ICU.  #Hypotension: Suspect Hypovolemia ~ RESOLVED  #Syncope PMHx: HFpEF, CAD -resolved now. -Vasopressors a required on admission~ WEANED OFF since around midnight 9/24 -Lactic acid has normalized - neg serial troponin -Echocardiogram showed EF 60-65%   #Acute Hypoxic Respiratory Failure due to Aspiration Pneumonia  -improved with Abx and back to baseline. On room air - neg respi panel   #Acute Kidney Injury superimposed on CKD Stage IV ~ IMPROVING  Baseline Cr: 1.7-2.05, Cr on admission: 2.42  -improved with hydration and at baseline l  Lab Results  Component Value Date   CREATININE 1.62 (H) 06/11/2024   CREATININE 2.07 (H) 06/10/2024   CREATININE 2.46 (H) 06/09/2024              Consultants: PCCM  Disposition: Home health Diet recommendation:  Discharge Diet Orders (From admission, onward)     Start     Ordered   06/11/24 0000  Diet - low sodium heart healthy        06/11/24 1427           Carb modified diet DISCHARGE  MEDICATION: Allergies as of 06/11/2024       Reactions   Alendronate    Other reaction(s): Abdominal Pain, Other (See Comments), Other (See Comments) abd pain abd pain abd pain   Levofloxacin    Other reaction(s): Muscle Pain   Codeine Nausea Only   Propoxyphene Nausea Only        Medication List     PAUSE taking these medications    lisinopril  2.5 MG tablet Wait to take this until: June 15, 2024 Commonly known as: ZESTRIL  Take 2.5 mg by mouth daily.   torsemide  20 MG tablet Wait to take this until: June 15, 2024 Commonly known as: DEMADEX  Take 20 mg by mouth daily.       STOP taking these medications    fexofenadine 180 MG tablet Commonly known as: ALLEGRA   naloxone 4 MG/0.1ML Liqd nasal spray kit Commonly known as: NARCAN   nitroGLYCERIN  0.4 MG SL tablet Commonly known as: NITROSTAT        TAKE these medications    acetaminophen  500 MG tablet Commonly known as: TYLENOL  Take 1,000 mg by mouth every 6 (six) hours as needed.   amoxicillin -clavulanate 500-125 MG tablet Commonly known as: Augmentin  Take 1 tablet by mouth 2 (two) times daily for 4 days.   Azelastine-Fluticasone 137-50 MCG/ACT Susp Place 1 spray into the nose in the morning and at bedtime.   clopidogrel  75 MG tablet Commonly known as: PLAVIX  Take 75 mg by mouth daily.  cyanocobalamin  1000 MCG tablet TAKE 1 TABLET EVERY DAY FOR VITAMIN B12 DEFICIENCY   fluticasone 50 MCG/ACT nasal spray Commonly known as: FLONASE Place 1 spray into both nostrils daily.   ipratropium 0.06 % nasal spray Commonly known as: ATROVENT  Place 2 sprays into the nose 3 (three) times daily.   levalbuterol 45 MCG/ACT inhaler Commonly known as: XOPENEX HFA Inhale 2 puffs into the lungs every 8 (eight) hours as needed.   lidocaine  5 % Commonly known as: LIDODERM  Place 1-2 patches onto the skin.   metoprolol  succinate 25 MG 24 hr tablet Commonly known as: TOPROL -XL Take 25 mg by mouth  daily.   omeprazole 40 MG capsule Commonly known as: PRILOSEC Take 40 mg by mouth daily.   pregabalin  150 MG capsule Commonly known as: LYRICA  Take 150 mg by mouth.   PreserVision AREDS 2 Caps Take 1 capsule by mouth daily.   VITAMIN D3 PO Take by mouth daily.        Discharge Exam: Filed Weights   06/09/24 2040  Weight: 66.4 kg   General:          Awake, no distress.  CV:                 s1, s2 normal Resp:               Normal effort.  Clear lungs,  Abd:                 No distention.  Soft and nontender Extremities              No swelling in legs.  No calf tenderness Neuro: Cranial nerves appear intact equal strength in arms and legs.  Alert and oriented x 3  Condition at discharge: fair  The results of significant diagnostics from this hospitalization (including imaging, microbiology, ancillary and laboratory) are listed below for reference.   Imaging Studies: ECHOCARDIOGRAM COMPLETE Result Date: 06/10/2024    ECHOCARDIOGRAM REPORT   Patient Name:   Cheyenne [name] [facility] Date of Exam: 06/10/2024 Medical Rec #:  [id number]         Height:       60.0 in Accession #:    [id number]        Weight:       146.4 lb Date of Birth:  1928/09/16        BSA:          1.635 m Patient Age:    89 years          BP:           129/48 mmHg Patient Gender: F                 HR:           70 bpm. Exam Location:  [facility] Procedure: 2D Echo (Both Spectral and Color Flow Doppler were utilized during            procedure). Indications:     syncope  History:         Patient has prior history of Echocardiogram examinations, most                  recent 02/14/2022. Chronic kidney disease.  Sonographer:     Tinnie Barefoot RDCS Referring Phys:  [id number] BRADLY POUR STEPHENS Diagnosing Phys: Keller Paterson  Sonographer Comments: Image acquisition challenging due to respiratory motion. IMPRESSIONS  1. Left ventricular ejection fraction, by estimation, is 60 to 65%. The left  ventricle has normal function. The left  ventricle has no regional wall motion abnormalities. Left ventricular diastolic parameters are consistent with Grade I diastolic dysfunction (impaired relaxation).  2. Right ventricular systolic function is normal. The right ventricular size is normal. There is normal pulmonary artery systolic pressure.  3. The mitral valve is normal in structure. Mild mitral valve regurgitation.  4. The aortic valve is tricuspid. Aortic valve regurgitation is trivial.  5. The inferior vena cava is normal in size with greater than 50% respiratory variability, suggesting right atrial pressure of 3 mmHg. FINDINGS  Left Ventricle: Left ventricular ejection fraction, by estimation, is 60 to 65%. The left ventricle has normal function. The left ventricle has no regional wall motion abnormalities. The left ventricular internal cavity size was normal in size. There is  no left ventricular hypertrophy. Left ventricular diastolic parameters are consistent with Grade I diastolic dysfunction (impaired relaxation). Right Ventricle: The right ventricular size is normal. No increase in right ventricular wall thickness. Right ventricular systolic function is normal. There is normal pulmonary artery systolic pressure. The tricuspid regurgitant velocity is 2.69 m/s, and  with an assumed right atrial pressure of 3 mmHg, the estimated right ventricular systolic pressure is 31.9 mmHg. Left Atrium: Left atrial size was normal in size. Right Atrium: Right atrial size was normal in size. Pericardium: There is no evidence of pericardial effusion. Mitral Valve: The mitral valve is normal in structure. Mild mitral valve regurgitation. Tricuspid Valve: The tricuspid valve is normal in structure. Tricuspid valve regurgitation is trivial. Aortic Valve: The aortic valve is tricuspid. Aortic valve regurgitation is trivial. Pulmonic Valve: The pulmonic valve was not well visualized. Pulmonic valve regurgitation is trivial. Aorta: The aortic root and ascending  aorta are structurally normal, with no evidence of dilitation. Venous: The inferior vena cava is normal in size with greater than 50% respiratory variability, suggesting right atrial pressure of 3 mmHg. IAS/Shunts: The atrial septum is grossly normal.  LEFT VENTRICLE PLAX 2D LVIDd:         3.70 cm   Diastology LVIDs:         2.30 cm   LV e' medial:    7.72 cm/s LV PW:         1.00 cm   LV E/e' medial:  11.7 LV IVS:        1.00 cm   LV e' lateral:   6.09 cm/s LVOT diam:     1.80 cm   LV E/e' lateral: 14.8 LV SV:         66 LV SV Index:   40 LVOT Area:     2.54 cm  RIGHT VENTRICLE             IVC RV Basal diam:  2.30 cm     IVC diam: 1.80 cm RV S prime:     14.40 cm/s TAPSE (M-mode): 2.6 cm LEFT ATRIUM             Index        RIGHT ATRIUM           Index LA diam:        3.10 cm 1.90 cm/m   RA Area:     10.30 cm LA Vol (A2C):   42.0 ml 25.69 ml/m  RA Volume:   19.70 ml  12.05 ml/m LA Vol (A4C):   36.2 ml 22.14 ml/m LA Biplane Vol: 42.2 ml 25.81 ml/m  AORTIC VALVE LVOT Vmax:   99.00 cm/s LVOT Vmean:  70.700 cm/s LVOT VTI:    0.260 m  AORTA Ao Root diam: 3.10 cm Ao Asc diam:  3.10 cm MITRAL VALVE                TRICUSPID VALVE MV Area (PHT): 3.12 cm     TR Peak grad:   28.9 mmHg MV Decel Time: 243 msec     TR Vmax:        269.00 cm/s MV E velocity: 90.10 cm/s MV A velocity: 122.00 cm/s  SHUNTS MV E/A ratio:  0.74         Systemic VTI:  0.26 m                             Systemic Diam: 1.80 cm Keller Paterson Electronically signed by Keller Paterson Signature Date/Time: 06/10/2024/12:45:59 PM    Final    DG Abd 1 View Result Date: 06/10/2024 EXAM: 1 VIEW XRAY OF THE ABDOMEN 06/10/2024 01:59:29 AM CLINICAL HISTORY: Abdominal distention FINDINGS: BOWEL: Nonobstructive bowel gas pattern. SOFT TISSUES: 10 mm rounded calcification within the right paraspinal region likely represents a rim calcified lymph node or potentially a vascular aneurysm. BONES: Osseous structures are age appropriate with mild-to-moderate  bilateral degenerative hip arthritis and degenerative changes noted within the lumbar spine. IMPRESSION: 1. No acute findings. Electronically signed by: Dorethia Molt MD 06/10/2024 02:07 AM EDT RP Workstation: [id number]   CT CHEST WO CONTRAST Result Date: 06/09/2024 CLINICAL DATA:  Pulmonary embolism suspected with high probability. Chest pain and weakness today. EXAM: CT CHEST WITHOUT CONTRAST TECHNIQUE: Multidetector CT imaging of the chest was performed following the standard protocol without IV contrast. RADIATION DOSE REDUCTION: This exam was performed according to the departmental dose-optimization program which includes automated exposure control, adjustment of the mA and/or kV according to patient size and/or use of iterative reconstruction technique. COMPARISON:  Chest radiograph 06/09/2024.  PET CT 06/26/2023 FINDINGS: Cardiovascular: The examination was obtained without IV contrast material. Unable to assess for potential pulmonary embolus without IV contrast material. There is cardiac enlargement. No pericardial effusions. Calcification of the aorta and coronary arteries. Normal caliber aorta. No aneurysm suggested. Mediastinum/Nodes: Esophagus is decompressed. No significant lymphadenopathy. Thyroid  gland is unremarkable. Lungs/Pleura: Peripheral interstitial and alveolar infiltrates in both lungs with peribronchial thickening and some mucous plugging. This may represent chronic fibrosis or scarring with superimposed alveolar infiltration. Alveolar infiltrates could represent inflammatory process or aspiration. Calcification and scarring in the lung apices is likely postinflammatory. No pleural effusion or pneumothorax. Upper Abdomen: No acute abnormalities demonstrated. 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 No change since prior study. Musculoskeletal: Degenerative changes in the spine. Compression of the T12 vertebra without change since previous studies.  IMPRESSION: 1. Cardiac enlargement.  No pericardial effusions. 2. Aortic atherosclerosis. 3. Scarring and postinflammatory calcifications in the lung apices and bases. 4. Suggestion of superimposed alveolar infiltration and mucous plugging possibly representing inflammatory process or aspiration. Electronically Signed   By: Elsie Gravely M.D.   On: 06/09/2024 22:35   CT HEAD WO CONTRAST ( ) Result Date: 06/09/2024 CLINICAL DATA:  Headache EXAM: CT HEAD WITHOUT CONTRAST TECHNIQUE: Contiguous axial images were obtained from the base of the skull through the vertex without intravenous contrast. RADIATION DOSE REDUCTION: This exam was performed according to the departmental dose-optimization program which includes automated exposure control, adjustment of the mA and/or kV according to patient size and/or use of iterative reconstruction technique. COMPARISON:  02/14/2022 FINDINGS:  Brain: There is atrophy and chronic small vessel disease changes. No acute intracranial abnormality. Specifically, no hemorrhage, hydrocephalus, mass lesion, acute infarction, or significant intracranial injury. Vascular: No hyperdense vessel or unexpected calcification. Skull: No acute calvarial abnormality. Sinuses/Orbits: No acute findings Other: None IMPRESSION: Atrophy, chronic microvascular disease. No acute intracranial abnormality. Electronically Signed   By: Franky Crease M.D.   On: 06/09/2024 22:30   DG Chest Portable 1 View Result Date: 06/09/2024 EXAM: 1 VIEW(S) XRAY OF THE CHEST 06/09/2024 09:13:00 PM COMPARISON: 01/17/2023. CLINICAL HISTORY: sob FINDINGS: LUNGS AND PLEURA: Biapical pleural parenchymal scarring. No pulmonary edema. No pleural effusion. No pneumothorax. HEART AND MEDIASTINUM: Aortic calcification. BONES AND SOFT TISSUES: Right chest wall surgical clips. Status post right mastectomy. No acute osseous abnormality. IMPRESSION: 1. No acute cardiopulmonary process. Electronically signed by: Pinkie Pebbles MD  06/09/2024 09:28 PM EDT RP Workstation: [id number]    Microbiology: Results for orders placed or performed during the hospital encounter of 06/09/24  Culture, blood (Routine X 2) w Reflex to ID Panel     Status: None (Preliminary result)   Collection Time: 06/09/24  8:59 PM   Specimen: BLOOD  Result Value Ref Range Status   Specimen Description BLOOD LEFT FA  Final   Special Requests   Final    BOTTLES DRAWN AEROBIC AND ANAEROBIC Blood Culture adequate volume   Culture   Final    NO GROWTH 2 DAYS Performed at [facility], [address] [place], [place] [address]    Report Status PENDING  Incomplete  Culture, blood (Routine X 2) w Reflex to ID Panel     Status: None (Preliminary result)   Collection Time: 06/09/24  9:00 PM   Specimen: BLOOD  Result Value Ref Range Status   Specimen Description BLOOD RIGTH [facility]  Final   Special Requests   Final    BOTTLES DRAWN AEROBIC AND ANAEROBIC Blood Culture results may not be optimal due to an inadequate volume of blood received in culture bottles   Culture   Final    NO GROWTH 2 DAYS Performed at [facility], [address] [place], [place] [address]    Report Status PENDING  Incomplete  Resp panel by RT-PCR (RSV, Flu A&B, Covid) Anterior Nasal Swab     Status: None   Collection Time: 06/09/24  9:05 PM   Specimen: Anterior Nasal Swab  Result Value Ref Range Status   SARS Coronavirus 2 by RT PCR NEGATIVE NEGATIVE Final    Comment: (NOTE) SARS-CoV-2 target nucleic acids are NOT DETECTED.  The SARS-CoV-2 RNA is generally detectable in upper respiratory specimens during the acute phase of infection. The lowest concentration of SARS-CoV-2 viral copies this assay can detect is 138 copies/mL. A negative result does not preclude SARS-Cov-2 infection and should not be used as the sole basis for treatment or other patient management decisions. A negative result may occur with  improper specimen collection/handling,  submission of specimen other than nasopharyngeal swab, presence of viral mutation(s) within the areas targeted by this assay, and inadequate number of viral copies(<138 copies/mL). A negative result must be combined with clinical observations, patient history, and epidemiological information. The expected result is Negative.  Fact Sheet for Patients:  BloggerCourse.com  Fact Sheet for Healthcare Providers:  SeriousBroker.it  This test is no t yet approved or cleared by the [place]  FDA and  has been authorized for detection and/or diagnosis of SARS-CoV-2 by FDA under an Emergency Use Authorization (EUA). This EUA will remain  in effect (meaning this test can be used) for the duration of the COVID-19 declaration under Section 564(b)(1) of the Act, 21 U.S.C.section 360bbb-3(b)(1), unless the authorization is terminated  or revoked sooner.       Influenza A by PCR NEGATIVE NEGATIVE Final   Influenza B by PCR NEGATIVE NEGATIVE Final    Comment: (NOTE) The Xpert Xpress SARS-CoV-2/FLU/RSV plus assay is intended as an aid in the diagnosis of influenza from Nasopharyngeal swab specimens and should not be used as a sole basis for treatment. Nasal washings and aspirates are unacceptable for Xpert Xpress SARS-CoV-2/FLU/RSV testing.  Fact Sheet for Patients: BloggerCourse.com  Fact Sheet for Healthcare Providers: SeriousBroker.it  This test is not yet approved or cleared by the [place]  FDA and has been authorized for detection and/or diagnosis of SARS-CoV-2 by FDA under an Emergency Use Authorization (EUA). This EUA will remain in effect (meaning this test can be used) for the duration of the COVID-19 declaration under Section 564(b)(1) of the Act, 21 U.S.C. section 360bbb-3(b)(1), unless the authorization is terminated or revoked.     Resp Syncytial Virus by PCR NEGATIVE  NEGATIVE Final    Comment: (NOTE) Fact Sheet for Patients: BloggerCourse.com  Fact Sheet for Healthcare Providers: SeriousBroker.it  This test is not yet approved or cleared by the [place]  FDA and has been authorized for detection and/or diagnosis of SARS-CoV-2 by FDA under an Emergency Use Authorization (EUA). This EUA will remain in effect (meaning this test can be used) for the duration of the COVID-19 declaration under Section 564(b)(1) of the Act, 21 U.S.C. section 360bbb-3(b)(1), unless the authorization is terminated or revoked.  Performed at [facility], [address] [place], [place] [address]   Respiratory (~20 pathogens) panel by PCR     Status: None   Collection Time: 06/10/24  9:50 AM   Specimen: Nasopharyngeal Swab; Respiratory  Result Value Ref Range Status   Adenovirus NOT DETECTED NOT DETECTED Final   Coronavirus 229E NOT DETECTED NOT DETECTED Final    Comment: (NOTE) The Coronavirus on the Respiratory Panel, DOES NOT test for the novel  Coronavirus (2019 nCoV)    Coronavirus HKU1 NOT DETECTED NOT DETECTED Final   Coronavirus NL63 NOT DETECTED NOT DETECTED Final   Coronavirus OC43 NOT DETECTED NOT DETECTED Final   Metapneumovirus NOT DETECTED NOT DETECTED Final   Rhinovirus / Enterovirus NOT DETECTED NOT DETECTED Final   Influenza A NOT DETECTED NOT DETECTED Final   Influenza B NOT DETECTED NOT DETECTED Final   Parainfluenza Virus 1 NOT DETECTED NOT DETECTED Final   Parainfluenza Virus 2 NOT DETECTED NOT DETECTED Final   Parainfluenza Virus 3 NOT DETECTED NOT DETECTED Final   Parainfluenza Virus 4 NOT DETECTED NOT DETECTED Final   Respiratory Syncytial Virus NOT DETECTED NOT DETECTED Final   Bordetella pertussis NOT DETECTED NOT DETECTED Final   Bordetella Parapertussis NOT DETECTED NOT DETECTED Final   Chlamydophila pneumoniae NOT DETECTED NOT DETECTED Final   Mycoplasma pneumoniae NOT  DETECTED NOT DETECTED Final    Comment: Performed at [facility], [address], [place], [place] [address]    Labs: CBC: Recent Labs  Lab 06/09/24 2100 06/10/24 0505 06/11/24 0420  WBC 6.5 4.9 4.9  NEUTROABS 3.3  --   --   HGB 10.4* 8.5* 9.0*  HCT 32.2* 26.9* 27.1*  MCV 98.8 100.7* 95.8  PLT 147* 109* 96*   Basic Metabolic Panel: Recent Labs  Lab 06/09/24 2100 06/09/24 2310 06/10/24 0505 06/11/24 0420  NA 136  --  137 139  K 4.7  --  4.9 4.7  CL 102  --  108 110  CO2 22  --  20* 19*  GLUCOSE 121*  --  141* 134*  BUN 50*  --  44* 39*  CREATININE 2.42* 2.46* 2.07* 1.62*  CALCIUM 8.5*  --  7.7* 8.4*  MG  --   --  1.7 2.0  PHOS  --   --  3.3 3.4   Liver Function Tests: Recent Labs  Lab 06/09/24 2100 06/10/24 0505 06/11/24 0420  AST 19 19  --   ALT 12 11  --   ALKPHOS 50 43  --   BILITOT 0.7 0.4  --   PROT 6.7 5.6*  --   ALBUMIN 3.5 2.9* 3.1*   CBG: No results for input(s): GLUCAP in the last 168 hours.  Discharge time spent: greater than 30 minutes.  Signed: Cresencio Fairly, MD [facility] 06/12/2024

## 2024-06-15 LAB — CULTURE, BLOOD (ROUTINE X 2)
Culture: NO GROWTH
Culture: NO GROWTH
Special Requests: ADEQUATE

## 2024-07-08 ENCOUNTER — Inpatient Hospital Stay: Attending: Internal Medicine

## 2024-07-08 DIAGNOSIS — N1832 Chronic kidney disease, stage 3b: Secondary | ICD-10-CM | POA: Diagnosis not present

## 2024-07-08 DIAGNOSIS — D631 Anemia in chronic kidney disease: Secondary | ICD-10-CM | POA: Diagnosis not present

## 2024-07-08 DIAGNOSIS — C9002 Multiple myeloma in relapse: Secondary | ICD-10-CM | POA: Insufficient documentation

## 2024-07-08 LAB — IRON AND TIBC
Iron: 107 ug/dL (ref 28–170)
Saturation Ratios: 35 % — ABNORMAL HIGH (ref 10.4–31.8)
TIBC: 302 ug/dL (ref 250–450)
UIBC: 195 ug/dL

## 2024-07-08 LAB — CMP (CANCER CENTER ONLY)
ALT: 12 U/L (ref 0–44)
AST: 22 U/L (ref 15–41)
Albumin: 4.1 g/dL (ref 3.5–5.0)
Alkaline Phosphatase: 63 U/L (ref 38–126)
Anion gap: 10 (ref 5–15)
BUN: 38 mg/dL — ABNORMAL HIGH (ref 8–23)
CO2: 23 mmol/L (ref 22–32)
Calcium: 9.3 mg/dL (ref 8.9–10.3)
Chloride: 101 mmol/L (ref 98–111)
Creatinine: 2.02 mg/dL — ABNORMAL HIGH (ref 0.44–1.00)
GFR, Estimated: 23 mL/min — ABNORMAL LOW (ref >60)
Glucose, Bld: 80 mg/dL (ref 70–99)
Potassium: 5.4 mmol/L — ABNORMAL HIGH (ref 3.5–5.1)
Sodium: 134 mmol/L — ABNORMAL LOW (ref 135–145)
Total Bilirubin: 0.8 mg/dL (ref 0.0–1.2)
Total Protein: 7.6 g/dL (ref 6.5–8.1)

## 2024-07-08 LAB — CBC WITH DIFFERENTIAL (CANCER CENTER ONLY)
Abs Immature Granulocytes: 0.01 K/uL (ref 0.00–0.07)
Basophils Absolute: 0 K/uL (ref 0.0–0.1)
Basophils Relative: 1 %
Eosinophils Absolute: 0.3 K/uL (ref 0.0–0.5)
Eosinophils Relative: 5 %
HCT: 32.8 % — ABNORMAL LOW (ref 36.0–46.0)
Hemoglobin: 11.1 g/dL — ABNORMAL LOW (ref 12.0–15.0)
Immature Granulocytes: 0 %
Lymphocytes Relative: 33 %
Lymphs Abs: 1.9 K/uL (ref 0.7–4.0)
MCH: 32.2 pg (ref 26.0–34.0)
MCHC: 33.8 g/dL (ref 30.0–36.0)
MCV: 95.1 fL (ref 80.0–100.0)
Monocytes Absolute: 0.5 K/uL (ref 0.1–1.0)
Monocytes Relative: 10 %
Neutro Abs: 2.8 K/uL (ref 1.7–7.7)
Neutrophils Relative %: 51 %
Platelet Count: 134 K/uL — ABNORMAL LOW (ref 150–400)
RBC: 3.45 MIL/uL — ABNORMAL LOW (ref 3.87–5.11)
RDW: 14.1 % (ref 11.5–15.5)
WBC Count: 5.6 K/uL (ref 4.0–10.5)
nRBC: 0 % (ref 0.0–0.2)

## 2024-07-08 LAB — FERRITIN: Ferritin: 129 ng/mL (ref 11–307)

## 2024-07-09 LAB — KAPPA/LAMBDA LIGHT CHAINS
Kappa free light chain: 34.6 mg/L — ABNORMAL HIGH (ref 3.3–19.4)
Kappa, lambda light chain ratio: 0.07 — ABNORMAL LOW (ref 0.26–1.65)
Lambda free light chains: 465.8 mg/L — ABNORMAL HIGH (ref 5.7–26.3)

## 2024-07-13 ENCOUNTER — Ambulatory Visit (INDEPENDENT_AMBULATORY_CARE_PROVIDER_SITE_OTHER): Admitting: Vascular Surgery

## 2024-07-13 ENCOUNTER — Encounter (INDEPENDENT_AMBULATORY_CARE_PROVIDER_SITE_OTHER)

## 2024-07-14 LAB — MULTIPLE MYELOMA PANEL, SERUM
Albumin SerPl Elph-Mcnc: 3.7 g/dL (ref 2.9–4.4)
Albumin/Glob SerPl: 1.1 (ref 0.7–1.7)
Alpha 1: 0.2 g/dL (ref 0.0–0.4)
Alpha2 Glob SerPl Elph-Mcnc: 0.7 g/dL (ref 0.4–1.0)
B-Globulin SerPl Elph-Mcnc: 1 g/dL (ref 0.7–1.3)
Gamma Glob SerPl Elph-Mcnc: 1.7 g/dL (ref 0.4–1.8)
Globulin, Total: 3.7 g/dL (ref 2.2–3.9)
IgA: 98 mg/dL (ref 64–422)
IgG (Immunoglobin G), Serum: 2000 mg/dL — ABNORMAL HIGH (ref 586–1602)
IgM (Immunoglobulin M), Srm: 48 mg/dL (ref 26–217)
M Protein SerPl Elph-Mcnc: 1.2 g/dL — ABNORMAL HIGH
Total Protein ELP: 7.4 g/dL (ref 6.0–8.5)

## 2024-07-22 ENCOUNTER — Inpatient Hospital Stay

## 2024-07-22 ENCOUNTER — Inpatient Hospital Stay: Attending: Internal Medicine | Admitting: Internal Medicine

## 2024-07-22 ENCOUNTER — Encounter: Payer: Self-pay | Admitting: Internal Medicine

## 2024-07-22 VITALS — BP 144/79 | HR 66 | Temp 95.8°F | Resp 16 | Ht 60.0 in | Wt 137.0 lb

## 2024-07-22 DIAGNOSIS — I509 Heart failure, unspecified: Secondary | ICD-10-CM | POA: Diagnosis not present

## 2024-07-22 DIAGNOSIS — C9002 Multiple myeloma in relapse: Secondary | ICD-10-CM | POA: Insufficient documentation

## 2024-07-22 DIAGNOSIS — N189 Chronic kidney disease, unspecified: Secondary | ICD-10-CM | POA: Diagnosis not present

## 2024-07-22 NOTE — Assessment & Plan Note (Signed)
#    STAGE III- JULY 2024- K/L= 42/247; Anemia [hb 10.3]; platelets- 127. No Hypercalcemia. GFR 30s. 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  Bone marrow- Scant iron. See below genetics: FISH: 13 q; 1q gain. # OCT 2024- PET scan: No findings suspicious for active myeloma.   # OCT 2025- MM- 1.2 /K-Light chain-465- slightly elevated I again discussed with the patient and family given her renal dysfunction and also anemia in context of bone marrow biopsy multiple myeloma will be considered smoldering/active-however as patient is not overtly symptomatic I think is reasonable especially in the-the context of age surveillance is reasonable.    # Again reviewed that with the patient that the plan is to proceed with treatment if patient noted to have worsening renal function or anemia.  If and when needed patient be considered for-low-dose Dex; Revlimid/ PO PI [to cut down travel]  # OCT 2024- Bone marrow [scant Iron]- Given the ongoing anemia/chronic kidney disease - continue gentle iron  1 pill a day.  HOLD venofer for now- stable. .   # CKD- [2021-2022]- III-IV [Dr.Singh]-question related to multiple myeloma versus others-overall stable since May 2024-  stable.  # History of CHF- clinically stable.   # Vaccination: flu shot [2024]; recommend pneumonia shot [as per PCP]  I spoke at length with the patient's family, Judi- regarding the patient's clinical status/plan of care.  Patient wants follow up in 6 M, and not 3 months- plan updated.    # DISPOSITION: # No venofer- # follow up in 6 months- MD; 2 weeks prior labs- cbc/cmp; iron studies;ferritin; MM panel; K/l light chains; [facility]' possible venofer--  Dr.B   Cc; PCP: Clotilda Nielsen

## 2024-07-22 NOTE — Progress Notes (Signed)
 [facility] CONSULT NOTE  Patient Care Team: Steva Clotilda DEL, NP as PCP - General (Family Medicine) Claudene Lamarr Salm, NP as Nurse Practitioner (Hospice and Palliative Medicine) Florencio Cara BIRCH, MD as Consulting Physician (Cardiology) Steva Clotilda DEL, NP as Nurse Practitioner (Family Medicine) Rennie Cindy SAUNDERS, MD as Consulting Physician (Oncology)  CHIEF COMPLAINTS/PURPOSE OF CONSULTATION: anemia/multiple myeloma.   Oncology History   No history exists.   Ref Range & Units 1 mo ago Comments  Free Kappa Lt Chains, S 3.3 - 19.4 mg/L 42.7 High    Free Lambda Lt Chains, S 5.7 - 26.3 mg/L 374.7 High    Free Kappa/Lambda Ratio 0.26 - 1.65 0.11 Low     HISTORY OF PRESENTING ILLNESS: Patient ambulating-cane/with assistance.  Accompanied by daughter.   Discussed the use of AI scribe software for clinical note transcription with the patient, who gave verbal consent to proceed.  History of Present Illness   Cheyenne [name] is a 89 year old female with multiple myeloma/MGUS who presents for a follow-up.  She experienced a recent episode of syncope while sitting at her dinner table, with no recollection of the event until she was in the ambulance. Her neighbor called 911 after she was found unresponsive. She was hospitalized for three days and treated for pneumonia due to aspiration. During her hospital stay, her blood pressure was noted to be very low, around 70/60s.  Her hemoglobin was low at 8 during hospitalization but has since improved to 11.1, consistent with her baseline. Her platelets were 134, within her usual range, and her white blood cell count was normal. Her kidney function was impaired with a creatinine level of 23, which has improved from 18-22 during her hospitalization.  Her potassium level was slightly elevated at 5.4, and she does not take potassium supplements.      Review of Systems  Constitutional:  Positive for malaise/fatigue.  Negative for chills, diaphoresis, fever and weight loss.  HENT:  Negative for nosebleeds and sore throat.   Eyes:  Negative for double vision.  Respiratory:  Negative for hemoptysis, sputum production, shortness of breath and wheezing.   Cardiovascular:  Negative for chest pain, palpitations, orthopnea and leg swelling.  Gastrointestinal:  Negative for abdominal pain, blood in stool, constipation, diarrhea, heartburn, melena, nausea and vomiting.  Genitourinary:  Negative for dysuria, frequency and urgency.  Musculoskeletal:  Positive for back pain and joint pain.  Skin: Negative.  Negative for itching and rash.  Neurological:  Negative for dizziness, tingling, focal weakness, weakness and headaches.  Endo/Heme/Allergies:  Does not bruise/bleed easily.  Psychiatric/Behavioral:  Negative for depression. The patient is not nervous/anxious and does not have insomnia.     MEDICAL HISTORY:  Past Medical History:  Diagnosis Date   A-fib [facility])    a.) CHA2DS2VASc = 6 (age x 2, sex, HFrEF, HTN, NSTEMI);  b.) rate/rhythm maintained on oral metoprolol  succinate; chronically anticoagulated with apixaban  + clopidogrel    Allergic rhinitis    Anemia    Anxiety    Arthritis    Asthma    Breast cancer, right (HCC) 1995   a.) s/p surgical resection (mastectomy) + chemotherapy + 5 years endocrine therapy (tamoxifen)   CAD (coronary artery disease)    a.) LHC 09/15/2021: 90% mLAD, 70% D1, 99% pLCx, 95% mRCA.   Carpal tunnel syndrome    Cervical radiculopathy due to degenerative joint disease of spine    Chronic kidney disease (CKD), stage IV (severe) (HCC)    Chronic use  of opiate for therapeutic purpose    a.) has naloxone Rx in place   Depression    Diverticulitis    Diverticulosis    DOE (dyspnea on exertion)    Followed by palliative care service    GERD (gastroesophageal reflux disease)    H. pylori infection 06/2005   H/O bilateral cataract extraction 12/2005   HFrEF (heart failure with  reduced ejection fraction) (HCC) 09/14/2021   a.) newly Dx'd. EF 35%, glob HK, sep/inf/post AK, mild LAE, triv AR/PR, mild MR/TR, GLS -9.1%, G2DD; b.) TTE 02/14/2022: EF 60-65%, mild-mod MR, G1DD   History of spinal fracture 1990   a.) T7 and T12 fractures - traumatic sequela of a mechanical fall   HLD (hyperlipidemia)    HTN (hypertension)    Impaired glucose tolerance    LBBB (left bundle branch block)    Long term current use of anticoagulant    a.) dose reduced apixaban    Long term current use of antithrombotics/antiplatelets    a.) clopidogrel    Lumbago    Lumbar radiculopathy    Macular degeneration    NSTEMI (non-ST elevated myocardial infarction) (HCC) 09/13/2021   a.) LHC/PCI 09/15/2021: 90% mLAD (plans for staged PCI), 70% D1, 99% pLCx (2.75 x 30 mm Resolute Onyx Frontier DES), 95% mRCA (3.5 x 15 mm Resolute Onyx Frontier DES); b.) staged PCI 09/19/2021 - PTCA/DES x1 (unknown type) to mLAD   Osteoporosis    Ovarian cyst    Paresthesia of both hands    Peripheral edema    PVD (peripheral vascular disease)    Vitamin D deficiency     SURGICAL HISTORY: Past Surgical History:  Procedure Laterality Date   ABDOMINAL HYSTERECTOMY  1972   CARPAL TUNNEL RELEASE Right 08/10/2019   CATARACT EXTRACTION Bilateral 2007   COLONOSCOPY     CORONARY ANGIOPLASTY WITH STENT PLACEMENT Left 09/15/2021   Procedure: CORONARY ANGIOPLASTY WITH STENT PLACEMENT; Location: [facility]; Surgeon: Paticia Sharp, MD   CORONARY ANGIOPLASTY WITH STENT PLACEMENT Left 09/19/2021   Procedure: CORONARY ANGIOPLASTY WITH STENT PLACEMENT; Location: [facility]; Surgeon: Lynnie Muta, MD   IR BONE MARROW BIOPSY & ASPIRATION  05/13/2023   LUMBAR LAMINECTOMY/DECOMPRESSION MICRODISCECTOMY Left 07/25/2022   Procedure: LEFT L4-5 LAMINOFORAMINOTOMY;  Surgeon: Clois Fret, MD;  Location: [facility];  Service: Neurosurgery;  Laterality: Left;   MASTECTOMY Right 1999   SIGMOIDOSCOPY  2004   UPPER GASTROINTESTINAL ENDOSCOPY   2006    SOCIAL HISTORY: Social History   Socioeconomic History   Marital status: Widowed    Spouse name: Not on file   Number of children: Not on file   Years of education: Not on file   Highest education level: Not on file  Occupational History   Not on file  Tobacco Use   Smoking status: Never   Smokeless tobacco: Never  Vaping Use   Vaping status: Never Used  Substance and Sexual Activity   Alcohol  use: Never   Drug use: Never   Sexual activity: Not on file  Other Topics Concern   Not on file  Social History Narrative   Lives alone   Social Drivers of Health   Financial Resource Strain: Low Risk  (04/21/2024)   Received from [facility]   Overall Financial Resource Strain (CARDIA)    Difficulty of Paying Living Expenses: Not very hard  Food Insecurity: No Food Insecurity (06/10/2024)   Hunger Vital Sign    Worried About Running Out of Food in the Last Year: Never true  Ran Out of Food in the Last Year: Never true  Transportation Needs: Unknown (06/10/2024)   PRAPARE - Administrator, Civil Service (Medical): Not on file    Lack of Transportation (Non-Medical): No  Physical Activity: Not on file  Stress: Stress Concern Present (10/29/2017)   Received from [facility] of Occupational Health - Occupational Stress Questionnaire    Feeling of Stress : To some extent  Social Connections: Unknown (06/11/2024)   Social Connection and Isolation Panel    Frequency of Communication with Friends and Family: More than three times a week    Frequency of Social Gatherings with Friends and Family: More than three times a week    Attends Religious Services: More than 4 times per year    Active Member of [employer] or Organizations: Yes    Attends Banker Meetings: More than 4 times per year    Marital Status: Patient unable to answer  Intimate Partner Violence: Not At Risk (06/10/2024)   Humiliation,  Afraid, Rape, and Kick questionnaire    Fear of Current or Ex-Partner: No    Emotionally Abused: No    Physically Abused: No    Sexually Abused: No    FAMILY HISTORY: Family History  Problem Relation Age of Onset   Multiple myeloma Daughter     ALLERGIES:  is allergic to alendronate, levofloxacin, codeine, and propoxyphene.  MEDICATIONS:  Current Outpatient Medications  Medication Sig Dispense Refill   acetaminophen  (TYLENOL ) 500 MG tablet Take 1,000 mg by mouth every 6 (six) hours as needed.     Azelastine-Fluticasone 137-50 MCG/ACT SUSP Place 1 spray into the nose in the morning and at bedtime.     Cholecalciferol (VITAMIN D3 PO) Take by mouth daily.     clopidogrel  (PLAVIX ) 75 MG tablet Take 75 mg by mouth daily.     cyanocobalamin  1000 MCG tablet TAKE 1 TABLET EVERY DAY FOR VITAMIN B12 DEFICIENCY     fluticasone (FLONASE) 50 MCG/ACT nasal spray Place 1 spray into both nostrils daily.     ipratropium (ATROVENT ) 0.06 % nasal spray Place 2 sprays into the nose 3 (three) times daily.     levalbuterol (XOPENEX HFA) 45 MCG/ACT inhaler Inhale 2 puffs into the lungs every 8 (eight) hours as needed.     lisinopril  (ZESTRIL ) 2.5 MG tablet Take 2.5 mg by mouth daily.     metoprolol  succinate (TOPROL -XL) 25 MG 24 hr tablet Take 25 mg by mouth daily.     Multiple Vitamins-Minerals (PRESERVISION AREDS 2) CAPS Take 1 capsule by mouth daily.     omeprazole (PRILOSEC) 40 MG capsule Take 40 mg by mouth daily.     pregabalin  (LYRICA ) 150 MG capsule Take 150 mg by mouth daily.     torsemide  (DEMADEX ) 20 MG tablet Take 20 mg by mouth daily.     lidocaine  (LIDODERM ) 5 % Place 1-2 patches onto the skin. (Patient not taking: Reported on 07/22/2024)     No current facility-administered medications for this visit.    PHYSICAL EXAMINATION:  Vitals:   07/22/24 1252 07/22/24 1308  BP: (!) 151/64 (!) 144/79  Pulse: 66   Resp: 16   Temp: (!) 95.8 F (35.4 C)   SpO2: 98%     Filed Weights    07/22/24 1252  Weight: 137 lb (62.1 kg)     Physical Exam Vitals and nursing note reviewed.  HENT:     Head: Normocephalic and atraumatic.  Mouth/Throat:     Pharynx: Oropharynx is clear.  Eyes:     Extraocular Movements: Extraocular movements intact.     Pupils: Pupils are equal, round, and reactive to light.  Cardiovascular:     Rate and Rhythm: Normal rate and regular rhythm.  Pulmonary:     Comments: Decreased breath sounds bilaterally.  Abdominal:     Palpations: Abdomen is soft.  Musculoskeletal:        General: Normal range of motion.     Cervical back: Normal range of motion.  Skin:    General: Skin is warm.  Neurological:     General: No focal deficit present.     Mental Status: She is alert and oriented to person, place, and time.  Psychiatric:        Behavior: Behavior normal.        Judgment: Judgment normal.     LABORATORY DATA:  I have reviewed the data as listed Lab Results  Component Value Date   WBC 5.6 07/08/2024   HGB 11.1 (L) 07/08/2024   HCT 32.8 (L) 07/08/2024   MCV 95.1 07/08/2024   PLT 134 (L) 07/08/2024   Recent Labs    06/09/24 2100 06/09/24 2310 06/10/24 0505 06/11/24 0420 07/08/24 1254  NA 136  --  137 139 134*  K 4.7  --  4.9 4.7 5.4*  CL 102  --  108 110 101  CO2 22  --  20* 19* 23  GLUCOSE 121*  --  141* 134* 80  BUN 50*  --  44* 39* 38*  CREATININE 2.42*   < > 2.07* 1.62* 2.02*  CALCIUM 8.5*  --  7.7* 8.4* 9.3  GFRNONAA 18*   < > 22* 29* 23*  PROT 6.7  --  5.6*  --  7.6  ALBUMIN 3.5  --  2.9* 3.1* 4.1  AST 19  --  19  --  22  ALT 12  --  11  --  12  ALKPHOS 50  --  43  --  63  BILITOT 0.7  --  0.4  --  0.8   < > = values in this interval not displayed.    RADIOGRAPHIC STUDIES: I have personally reviewed the radiological images as listed and agreed with the findings in the report. No results found.   Multiple myeloma in relapse [facility]) #  STAGE III- JULY 2024- K/L= 42/247; Anemia [hb 10.3]; platelets- 127. No  Hypercalcemia. GFR 30s. 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  Bone marrow- Scant iron. See below genetics: FISH: 13 q; 1q gain. # OCT 2024- PET scan: No findings suspicious for active myeloma.   # OCT 2025- MM- 1.2 /K-Light chain-465- slightly elevated I again discussed with the patient and family given her renal dysfunction and also anemia in context of bone marrow biopsy multiple myeloma will be considered smoldering/active-however as patient is not overtly symptomatic I think is reasonable especially in the-the context of age surveillance is reasonable.    # Again reviewed that with the patient that the plan is to proceed with treatment if patient noted to have worsening renal function or anemia.  If and when needed patient be considered for-low-dose Dex; Revlimid/ PO PI [to cut down travel]  # OCT 2024- Bone marrow [scant Iron]- Given the  ongoing anemia/chronic kidney disease - continue gentle iron  1 pill a day.  HOLD venofer for now- stable. .   # CKD- [2021-2022]- III-IV [Dr.Singh]-question related to multiple myeloma versus others-overall stable since May 2024-  stable.  # History of CHF- clinically stable.   # Vaccination: flu shot [2024]; recommend pneumonia shot [as per PCP]  I spoke at length with the patient's family, Judi- regarding the patient's clinical status/plan of care.  Patient wants follow up in 6 M, and not 3 months- plan updated.    # DISPOSITION: # No venofer- # follow up in 6 months- MD; 2 weeks prior labs- cbc/cmp; iron studies;ferritin; MM panel; K/l light chains; [facility]' possible venofer--  Dr.B   Cc; PCP: Clotilda Nielsen    Above plan of care was discussed with patient/family in detail.  My contact information was  given to the patient/family.    Cindy JONELLE Joe, MD 07/22/2024 1:51 PM

## 2024-07-22 NOTE — Progress Notes (Signed)
 [facility] 06/11/24 for syncope. Has questions about some of her labs being low.

## 2024-07-27 ENCOUNTER — Encounter: Payer: Self-pay | Admitting: Internal Medicine

## 2024-10-05 ENCOUNTER — Other Ambulatory Visit
Admission: RE | Admit: 2024-10-05 | Discharge: 2024-10-05 | Disposition: A | Discharge status: Home / Self Care | Source: Ambulatory Visit | Attending: Nephrology | Admitting: Nephrology

## 2024-10-05 DIAGNOSIS — E875 Hyperkalemia: Secondary | ICD-10-CM | POA: Insufficient documentation

## 2024-10-05 DIAGNOSIS — R8290 Unspecified abnormal findings in urine: Secondary | ICD-10-CM | POA: Diagnosis not present

## 2024-10-05 DIAGNOSIS — I129 Hypertensive chronic kidney disease with stage 1 through stage 4 chronic kidney disease, or unspecified chronic kidney disease: Secondary | ICD-10-CM | POA: Insufficient documentation

## 2024-10-05 DIAGNOSIS — C9000 Multiple myeloma not having achieved remission: Secondary | ICD-10-CM | POA: Diagnosis not present

## 2024-10-05 DIAGNOSIS — E790 Hyperuricemia without signs of inflammatory arthritis and tophaceous disease: Secondary | ICD-10-CM | POA: Diagnosis not present

## 2024-10-05 DIAGNOSIS — N184 Chronic kidney disease, stage 4 (severe): Secondary | ICD-10-CM | POA: Diagnosis present

## 2024-10-05 DIAGNOSIS — N2581 Secondary hyperparathyroidism of renal origin: Secondary | ICD-10-CM | POA: Diagnosis not present

## 2024-10-05 LAB — URINALYSIS, COMPLETE (UACMP) WITH MICROSCOPIC
Bacteria, UA: NONE SEEN
Bilirubin Urine: NEGATIVE
Glucose, UA: NEGATIVE mg/dL
Hgb urine dipstick: NEGATIVE
Ketones, ur: NEGATIVE mg/dL
Leukocytes,Ua: NEGATIVE
Nitrite: NEGATIVE
Protein, ur: NEGATIVE mg/dL
RBC / HPF: 0 RBC/hpf (ref 0–5)
Specific Gravity, Urine: 1.005 (ref 1.005–1.030)
Squamous Epithelial / HPF: 0 /HPF (ref 0–5)
WBC, UA: 0 WBC/hpf (ref 0–5)
pH: 5 (ref 5.0–8.0)

## 2024-10-05 LAB — RENAL FUNCTION PANEL
Albumin: 4.4 g/dL (ref 3.5–5.0)
Anion gap: 11 (ref 5–15)
BUN: 44 mg/dL — ABNORMAL HIGH (ref 8–23)
CO2: 23 mmol/L (ref 22–32)
Calcium: 9 mg/dL (ref 8.9–10.3)
Chloride: 102 mmol/L (ref 98–111)
Creatinine, Ser: 2.19 mg/dL — ABNORMAL HIGH (ref 0.44–1.00)
GFR, Estimated: 20 mL/min — ABNORMAL LOW
Glucose, Bld: 87 mg/dL (ref 70–99)
Phosphorus: 3.9 mg/dL (ref 2.5–4.6)
Potassium: 4.9 mmol/L (ref 3.5–5.1)
Sodium: 136 mmol/L (ref 135–145)

## 2024-10-05 LAB — CBC WITH DIFFERENTIAL/PLATELET
Abs Immature Granulocytes: 0.01 K/uL (ref 0.00–0.07)
Basophils Absolute: 0 K/uL (ref 0.0–0.1)
Basophils Relative: 0 %
Eosinophils Absolute: 0.3 K/uL (ref 0.0–0.5)
Eosinophils Relative: 5 %
HCT: 32 % — ABNORMAL LOW (ref 36.0–46.0)
Hemoglobin: 10.7 g/dL — ABNORMAL LOW (ref 12.0–15.0)
Immature Granulocytes: 0 %
Lymphocytes Relative: 32 %
Lymphs Abs: 1.8 K/uL (ref 0.7–4.0)
MCH: 31.7 pg (ref 26.0–34.0)
MCHC: 33.4 g/dL (ref 30.0–36.0)
MCV: 94.7 fL (ref 80.0–100.0)
Monocytes Absolute: 0.5 K/uL (ref 0.1–1.0)
Monocytes Relative: 9 %
Neutro Abs: 2.9 K/uL (ref 1.7–7.7)
Neutrophils Relative %: 54 %
Platelets: 111 K/uL — ABNORMAL LOW (ref 150–400)
RBC: 3.38 MIL/uL — ABNORMAL LOW (ref 3.87–5.11)
RDW: 13.9 % (ref 11.5–15.5)
WBC: 5.6 K/uL (ref 4.0–10.5)
nRBC: 0 % (ref 0.0–0.2)

## 2024-10-05 LAB — PROTEIN / CREATININE RATIO, URINE
Creatinine, Urine: 20 mg/dL
Total Protein, Urine: <6 mg/dL

## 2024-10-05 LAB — URIC ACID: Uric Acid, Serum: 8.5 mg/dL — ABNORMAL HIGH (ref 2.5–7.1)

## 2024-10-05 LAB — MAGNESIUM: Magnesium: 1.9 mg/dL (ref 1.7–2.4)

## 2024-10-06 LAB — PARATHYROID HORMONE, INTACT (NO CA): PTH: 54 pg/mL (ref 15–65)

## 2025-01-19 ENCOUNTER — Inpatient Hospital Stay

## 2025-02-02 ENCOUNTER — Inpatient Hospital Stay

## 2025-02-02 ENCOUNTER — Inpatient Hospital Stay: Admitting: Internal Medicine
